# Patient Record
Sex: Male | Born: 1960 | Race: Black or African American | Hispanic: No | Marital: Single | Smoking: Former smoker
Health system: Southern US, Community
[De-identification: ages and names within clinical notes are randomized; demographics above are authoritative.]

## PROBLEM LIST (undated history)

## (undated) DIAGNOSIS — M109 Gout, unspecified: Secondary | ICD-10-CM

## (undated) DIAGNOSIS — E119 Type 2 diabetes mellitus without complications: Secondary | ICD-10-CM

## (undated) DIAGNOSIS — J45909 Unspecified asthma, uncomplicated: Secondary | ICD-10-CM

## (undated) DIAGNOSIS — K219 Gastro-esophageal reflux disease without esophagitis: Secondary | ICD-10-CM

## (undated) DIAGNOSIS — I10 Essential (primary) hypertension: Secondary | ICD-10-CM

## (undated) DIAGNOSIS — E7800 Pure hypercholesterolemia, unspecified: Secondary | ICD-10-CM

---

## 2013-09-26 DIAGNOSIS — M109 Gout, unspecified: Secondary | ICD-10-CM | POA: Insufficient documentation

## 2018-12-31 ENCOUNTER — Emergency Department (HOSPITAL_COMMUNITY): Payer: Self-pay

## 2018-12-31 ENCOUNTER — Encounter (HOSPITAL_COMMUNITY): Payer: Self-pay

## 2018-12-31 ENCOUNTER — Other Ambulatory Visit: Payer: Self-pay

## 2018-12-31 ENCOUNTER — Emergency Department (HOSPITAL_COMMUNITY)
Admission: EM | Admit: 2018-12-31 | Discharge: 2018-12-31 | Disposition: A | Discharge status: Home / Self Care | Payer: Self-pay | Attending: Emergency Medicine | Admitting: Emergency Medicine

## 2018-12-31 DIAGNOSIS — E119 Type 2 diabetes mellitus without complications: Secondary | ICD-10-CM | POA: Insufficient documentation

## 2018-12-31 DIAGNOSIS — U071 COVID-19: Secondary | ICD-10-CM | POA: Insufficient documentation

## 2018-12-31 DIAGNOSIS — I10 Essential (primary) hypertension: Secondary | ICD-10-CM | POA: Insufficient documentation

## 2018-12-31 HISTORY — DX: Type 2 diabetes mellitus without complications: E11.9

## 2018-12-31 HISTORY — DX: Gastro-esophageal reflux disease without esophagitis: K21.9

## 2018-12-31 HISTORY — DX: Pure hypercholesterolemia, unspecified: E78.00

## 2018-12-31 HISTORY — DX: Essential (primary) hypertension: I10

## 2018-12-31 LAB — POC SARS CORONAVIRUS 2 AG -  ED: SARS Coronavirus 2 Ag: POSITIVE — AB

## 2018-12-31 LAB — COMPREHENSIVE METABOLIC PANEL
ALT: 28 U/L (ref 0–44)
AST: 28 U/L (ref 15–41)
Albumin: 3.7 g/dL (ref 3.5–5.0)
Alkaline Phosphatase: 78 U/L (ref 38–126)
Anion gap: 11 (ref 5–15)
BUN: 14 mg/dL (ref 6–20)
CO2: 27 mmol/L (ref 22–32)
Calcium: 8.6 mg/dL — ABNORMAL LOW (ref 8.9–10.3)
Chloride: 94 mmol/L — ABNORMAL LOW (ref 98–111)
Creatinine, Ser: 1.39 mg/dL — ABNORMAL HIGH (ref 0.61–1.24)
GFR calc Af Amer: >60 mL/min (ref >60)
GFR calc non Af Amer: 55 mL/min — ABNORMAL LOW (ref >60)
Glucose, Bld: 215 mg/dL — ABNORMAL HIGH (ref 70–99)
Potassium: 3.1 mmol/L — ABNORMAL LOW (ref 3.5–5.1)
Sodium: 132 mmol/L — ABNORMAL LOW (ref 135–145)
Total Bilirubin: 0.9 mg/dL (ref 0.3–1.2)
Total Protein: 7.4 g/dL (ref 6.5–8.1)

## 2018-12-31 LAB — CBC
HCT: 39.4 % (ref 39.0–52.0)
Hemoglobin: 13.1 g/dL (ref 13.0–17.0)
MCH: 28.4 pg (ref 26.0–34.0)
MCHC: 33.2 g/dL (ref 30.0–36.0)
MCV: 85.5 fL (ref 80.0–100.0)
Platelets: 209 10*3/uL (ref 150–400)
RBC: 4.61 MIL/uL (ref 4.22–5.81)
RDW: 12.6 % (ref 11.5–15.5)
WBC: 4.7 10*3/uL (ref 4.0–10.5)
nRBC: 0 % (ref 0.0–0.2)

## 2018-12-31 LAB — LIPASE, BLOOD: Lipase: 44 U/L (ref 11–51)

## 2018-12-31 LAB — TROPONIN I (HIGH SENSITIVITY): Troponin I (High Sensitivity): 4 ng/L (ref <18)

## 2018-12-31 LAB — CK: Total CK: 52 U/L (ref 49–397)

## 2018-12-31 MED ORDER — SUCRALFATE 1 G PO TABS: 1.0000 g | ORAL_TABLET | Freq: Three times a day (TID) | ORAL | 0 refills | Status: DC

## 2018-12-31 MED ORDER — SODIUM CHLORIDE 0.9 % IV BOLUS
1000.0000 mL | Freq: Once | INTRAVENOUS | Status: AC
  Administered 2018-12-31: 1000 mL via INTRAVENOUS

## 2018-12-31 MED ORDER — ACETAMINOPHEN 500 MG PO TABS
1000.0000 mg | ORAL_TABLET | Freq: Once | ORAL | Status: AC
  Administered 2018-12-31: 1000 mg via ORAL
  Filled 2018-12-31: qty 2

## 2018-12-31 MED ORDER — SUCRALFATE 1 GM/10ML PO SUSP
1.0000 g | Freq: Once | ORAL | Status: AC
  Administered 2018-12-31: 1 g via ORAL
  Filled 2018-12-31: qty 10

## 2018-12-31 MED ORDER — SODIUM CHLORIDE 0.9% FLUSH
3.0000 mL | Freq: Once | INTRAVENOUS | Status: AC
  Administered 2018-12-31: 3 mL via INTRAVENOUS

## 2018-12-31 NOTE — ED Triage Notes (Signed)
Pt c/o generalized body aches for two weeks. Pt also describes having severe acid reflux that isn't going away. Pt states normally Prilosec helps, but he has been taking it every 2 hours without relief. Pt states that he has been having chills and night sweats, but this is commons with his diabetes. No change in diet.

## 2018-12-31 NOTE — ED Provider Notes (Signed)
[facility]-EMERGENCY DEPT Provider Note   CSN: [id number] Arrival date & time: 12/31/18  [id number]     History   Chief Complaint Chief Complaint  Patient presents with  . Generalized Body Aches    HPI Patrick Hawkins is a 58 y.o. male.     HPI Patient presents concern of fatigue, diffuse myalgia. Patient states that he is generally well, though he does have a history of diabetes, hypertension, and reflux. He has been taking his medication regularly. Without clear precipitant, 2 weeks ago he began having diffuse body aches, fatigue. Severity has been waxing, waning, but when symptoms are at their most pronounced, he is incapable of performing ADL, without difficulty. No focal pain.  He does have some discomfort in his sternum, associated with reflux, which he is familiar with. No dyspnea, no fever he is aware of, no vomiting, no diarrhea.  Patient notes that history of night sweats, going back 10 years is also unchanged. 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 He does not smoke, drinks alcohol.  Past Medical History:  Diagnosis Date  . Acid reflux   . Diabetes mellitus without complication (HCC)   . High cholesterol   . Hypertension       Home Medications    Prior to Admission medications   Not on File    Family History No family history on file.  Social History Social History   Tobacco Use  . Smoking status: Never Smoker  . Smokeless tobacco: Never Used  Substance Use Topics  . Alcohol use: Yes    Alcohol/week: 36.0 standard drinks    Types: 36 Cans of beer per week    Comment: 6 bottles/night  . Drug use: Never     Allergies   Cat hair extract and Shellfish allergy   Review of Systems Review of Systems  Constitutional:       Per HPI, otherwise negative  HENT:       Per HPI, otherwise negative  Respiratory:       Per HPI, otherwise negative   Cardiovascular:       Per HPI, otherwise negative  Gastrointestinal: Negative for vomiting.  Endocrine:       Negative aside from HPI  Genitourinary:       Neg aside from HPI   Musculoskeletal:       Per HPI, otherwise negative  Skin: Negative for rash.  Neurological: Positive for weakness. Negative for syncope.     Physical Exam Updated Vital Signs BP 137/83 (BP Location: Left Arm)   Pulse 89   Temp (!) 100.4 F (38 C) (Oral)   Resp 18   SpO2 99%   Physical Exam Vitals signs and nursing note reviewed.  Constitutional:      General: He is not in acute distress.    Appearance: He is well-developed.  HENT:     Head: Normocephalic and atraumatic.  Eyes:     Conjunctiva/sclera: Conjunctivae normal.  Cardiovascular:     Rate and Rhythm: Normal rate and regular rhythm.  Pulmonary:     Effort: Pulmonary effort is normal. No respiratory distress.     Breath sounds: No stridor.  Abdominal:     General: There is no distension.  Skin:    General: Skin is warm and dry.  Neurological:     Mental Status: He is alert and oriented to person, place, and time.  ED Treatments / Results  Labs (all labs ordered are listed, but only abnormal results are displayed) Labs Reviewed  COMPREHENSIVE METABOLIC PANEL - Abnormal; Notable for the following components:      Result Value   Sodium 132 (*)    Potassium 3.1 (*)    Chloride 94 (*)    Glucose, Bld 215 (*)    Creatinine, Ser 1.39 (*)    Calcium 8.6 (*)    GFR calc non Af Amer 55 (*)    All other components within normal limits  POC SARS CORONAVIRUS 2 AG -  ED - Abnormal; Notable for the following components:   SARS Coronavirus 2 Ag POSITIVE (*)    All other components within normal limits  CBC  LIPASE, BLOOD  CK  TROPONIN I (HIGH SENSITIVITY)  TROPONIN I (HIGH SENSITIVITY)    EKG EKG Interpretation  Date/Time:  Friday December 31 2018 09:34:49 EST Ventricular Rate:  105 PR Interval:    QRS Duration: 120 QT  Interval:  335 QTC Calculation: 443 R Axis:   -37 Text Interpretation: Sinus tachycardia LVH with IVCD, LAD and secondary repol abnrm Baseline wander in lead(s) V1 Non-specific intra-ventricular conduction delay Artifact Abnormal ECG Confirmed by Carmin Muskrat [phone]) on 12/31/2018 9:39:47 AM   Radiology Dg Chest 2 View  Result Date: 12/31/2018 CLINICAL DATA:  Body aches, chest pain. EXAM: CHEST - 2 VIEW COMPARISON:  No pertinent prior studies available for comparison. FINDINGS: Heart size within normal limits. No airspace consolidation. No evidence of pneumothorax or sizable pleural effusion. No acute bony abnormality. Overlying monitoring leads. IMPRESSION: No airspace consolidation. Electronically Signed   By: Kellie Simmering DO   On: 12/31/2018 10:05    Procedures Procedures (including critical care time)  Medications Ordered in ED Medications  sodium chloride flush (NS) 0.9 % injection 3 mL (3 mLs Intravenous Given 12/31/18 1003)  sodium chloride 0.9 % bolus 1,000 mL (1,000 mLs Intravenous New Bag/Given 12/31/18 1003)  sucralfate (CARAFATE) 1 GM/10ML suspension 1 g (1 g Oral Given 12/31/18 0953)  acetaminophen (TYLENOL) tablet 1,000 mg (1,000 mg Oral Given 12/31/18 0953)     Initial Impression / Assessment and Plan / ED Course  I have reviewed the triage vital signs and the nursing notes.  Pertinent labs & imaging results that were available during my care of the patient were reviewed by me and considered in my medical decision making (see chart for details).        11:07 AM Patient awake, alert, hemodynamically unremarkable still. I reviewed labs, x-ray with him at length, including no evidence for pneumonia, no substantial abnormalities. Patient does have Covid positive test.  We spent some time discussing this.  Absent hemodynamic instability, and given the duration of illness about 2 weeks, without decompensation, the patient is appropriate for discharge with outpatient  management.  We discussed importance of quarantine, self-monitoring, return precautions.  Patrick Hawkins was evaluated in Emergency Department on 12/31/2018 for the symptoms described in the history of present illness. He was evaluated in the context of the global COVID-19 pandemic, which necessitated consideration that the patient might be at risk for infection with the SARS-CoV-2 virus that causes COVID-19. Institutional protocols and algorithms that pertain to the evaluation of patients at risk for COVID-19 are in a state of rapid change based on information released by regulatory bodies including the CDC and federal and state organizations. These policies and algorithms were followed during the patient's care in the ED.    Final Clinical  Impressions(s) / ED Diagnoses   Final diagnoses:  COVID-19 virus infection     Gerhard Munch, MD 12/31/18 1108

## 2018-12-31 NOTE — ED Notes (Signed)
Patient transported to CT 

## 2019-03-30 ENCOUNTER — Encounter (HOSPITAL_COMMUNITY): Payer: Self-pay | Admitting: Emergency Medicine

## 2019-03-30 ENCOUNTER — Other Ambulatory Visit: Payer: Self-pay

## 2019-03-30 ENCOUNTER — Emergency Department (HOSPITAL_COMMUNITY)
Admission: EM | Admit: 2019-03-30 | Discharge: 2019-03-31 | Discharge status: Against Medical Advice | Payer: Self-pay | Attending: Emergency Medicine | Admitting: Emergency Medicine

## 2019-03-30 DIAGNOSIS — Z653 Problems related to other legal circumstances: Secondary | ICD-10-CM

## 2019-03-30 NOTE — ED Notes (Signed)
Pt currently refusing everything and being combative with PD
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
## 2019-03-30 NOTE — ED Provider Notes (Addendum)
[place] DEPT Provider Note   CSN: [id number] Arrival date & time: 03/30/19  2315     History No chief complaint on file.   Patrick Hawkins is a 59 y.o. male.  The history is provided by the patient.  Illness Location:  In the street  Quality:  Brought in for legal blood draw. Patient is denying complaints and refusing all care.   Severity:  Mild Onset quality:  Sudden Timing:  Constant Progression:  Unchanged Chronicity:  New Context:  In police custody Relieved by:  Nothing Worsened by:  Nothing Ineffective treatments:  None tried Associated symptoms: no abdominal pain, no chest pain, no congestion and no wheezing        Past Medical History:  Diagnosis Date  . Acid reflux   . Diabetes mellitus without complication ([place])   . High cholesterol   . Hypertension     There are no problems to display for this patient.   History reviewed. No pertinent surgical history.     History reviewed. No pertinent family history.  Social History   Tobacco Use  . Smoking status: Never Smoker  . Smokeless tobacco: Never Used  Substance Use Topics  . Alcohol use: Yes    Alcohol/week: 36.0 standard drinks    Types: 36 Cans of beer per week    Comment: 6 bottles/night  . Drug use: Never    Home Medications Prior to Admission medications   Medication Sig Start Date End Date Taking? Authorizing Provider  sucralfate (CARAFATE) 1 g tablet Take 1 tablet (1 g total) by mouth 4 (four) times daily -  with meals and at bedtime. 12/31/18   Carmin Muskrat, MD    Allergies    Cat hair extract and Shellfish allergy  Review of Systems   Review of Systems  Constitutional: Negative for unexpected weight change.  HENT: Negative for congestion.   Eyes: Negative for visual disturbance.  Respiratory: Negative for wheezing.   Cardiovascular: Negative for chest pain.  Gastrointestinal: Negative for abdominal pain.  Endocrine: Negative for polyuria.   Genitourinary: Negative for difficulty urinating.  Musculoskeletal: Negative for arthralgias.  Neurological: Negative for dizziness.  All other systems reviewed and are negative.   Physical Exam Updated Vital Signs There were no vitals taken for this visit.  Physical Exam Vitals and nursing note reviewed.  Constitutional:      General: He is not in acute distress.    Appearance: Normal appearance.  HENT:     Head: Normocephalic and atraumatic.     Nose: Nose normal.  Eyes:     Conjunctiva/sclera: Conjunctivae normal.     Pupils: Pupils are equal, round, and reactive to light.  Cardiovascular:     Rate and Rhythm: Normal rate and regular rhythm.     Pulses: Normal pulses.     Heart sounds: Normal heart sounds.  Pulmonary:     Effort: Pulmonary effort is normal.     Breath sounds: Normal breath sounds.  Abdominal:     General: Abdomen is flat. Bowel sounds are normal.     Tenderness: There is no abdominal tenderness. There is no guarding.  Musculoskeletal:        General: Normal range of motion.     Cervical back: Normal range of motion and neck supple.     Right lower leg: No edema.     Left lower leg: No edema.  Skin:    General: Skin is warm and dry.     Capillary  Refill: Capillary refill takes less than 2 seconds.  Neurological:     General: No focal deficit present.     Mental Status: He is alert and oriented to person, place, and time.     Deep Tendon Reflexes: Reflexes normal.  Psychiatric:        Mood and Affect: Affect is angry.        Behavior: Behavior is aggressive.     ED Results / Procedures / Treatments   Labs (all labs ordered are listed, but only abnormal results are displayed) Labs Reviewed - No data to display  EKG None  Radiology No results found.  Procedures Procedures (including critical care time)  Medications Ordered in ED Medications - No data to display  ED Course  I have reviewed the triage vital signs and the nursing  notes.  Pertinent labs & imaging results that were available during my care of the patient were reviewed by me and considered in my medical decision making (see chart for details).   Patient is clinically sober and is AO4.  He was not supposed to have been checked in as a patient and is denying all complaints and refusing all medical care as is his right.  He understands the ramifications of his actions.  He is leaving against medical advice.      Patient refused care multiple times and all intervention.  He is leaving AMA in police custody.    Final Clinical Impression(s) / ED Diagnoses AMA from the hospital in police custody.     Leon Goodnow, MD 03/30/19 Soledad Gerlach, Adelaida Reindel, MD 03/31/19 0272

## 2019-03-30 NOTE — ED Notes (Signed)
Charge nurse attempting to drawl legal blood drawl.

## 2019-03-30 NOTE — ED Notes (Signed)
MD at bedside. 

## 2019-03-30 NOTE — ED Notes (Signed)
PT refused all treatment from ED staff.

## 2019-03-30 NOTE — ED Notes (Signed)
Pt placed in hard restraints with verbal order form Palumbo MD. Pt verbal abusive and attempting to leave. Pt restrained by PD and is in PD custody.

## 2019-05-28 ENCOUNTER — Ambulatory Visit (HOSPITAL_COMMUNITY): Admission: EM | Admit: 2019-05-28 | Discharge: 2019-05-28 | Discharge status: Against Medical Advice | Payer: Self-pay

## 2019-05-28 ENCOUNTER — Other Ambulatory Visit: Payer: Self-pay

## 2019-05-28 NOTE — ED Notes (Signed)
Pt states he will return at a later time

## 2019-05-30 ENCOUNTER — Ambulatory Visit (HOSPITAL_COMMUNITY)
Admission: EM | Admit: 2019-05-30 | Discharge: 2019-05-30 | Disposition: A | Discharge status: Home / Self Care | Payer: Self-pay | Attending: Internal Medicine | Admitting: Internal Medicine

## 2019-05-30 ENCOUNTER — Other Ambulatory Visit: Payer: Self-pay

## 2019-05-30 DIAGNOSIS — R61 Generalized hyperhidrosis: Secondary | ICD-10-CM | POA: Insufficient documentation

## 2019-05-30 DIAGNOSIS — F5101 Primary insomnia: Secondary | ICD-10-CM | POA: Insufficient documentation

## 2019-05-30 LAB — CBC WITH DIFFERENTIAL/PLATELET
Abs Immature Granulocytes: 0.02 10*3/uL (ref 0.00–0.07)
Basophils Absolute: 0.1 10*3/uL (ref 0.0–0.1)
Basophils Relative: 2 %
Eosinophils Absolute: 0.1 10*3/uL (ref 0.0–0.5)
Eosinophils Relative: 2 %
HCT: 39.5 % (ref 39.0–52.0)
Hemoglobin: 13 g/dL (ref 13.0–17.0)
Immature Granulocytes: 0 %
Lymphocytes Relative: 32 %
Lymphs Abs: 1.9 10*3/uL (ref 0.7–4.0)
MCH: 28.5 pg (ref 26.0–34.0)
MCHC: 32.9 g/dL (ref 30.0–36.0)
MCV: 86.6 fL (ref 80.0–100.0)
Monocytes Absolute: 0.6 10*3/uL (ref 0.1–1.0)
Monocytes Relative: 10 %
Neutro Abs: 3.3 10*3/uL (ref 1.7–7.7)
Neutrophils Relative %: 54 %
Platelets: 260 10*3/uL (ref 150–400)
RBC: 4.56 MIL/uL (ref 4.22–5.81)
RDW: 12.5 % (ref 11.5–15.5)
WBC: 6 10*3/uL (ref 4.0–10.5)
nRBC: 0 % (ref 0.0–0.2)

## 2019-05-30 LAB — COMPREHENSIVE METABOLIC PANEL
ALT: 22 U/L (ref 0–44)
AST: 19 U/L (ref 15–41)
Albumin: 3.9 g/dL (ref 3.5–5.0)
Alkaline Phosphatase: 62 U/L (ref 38–126)
Anion gap: 12 (ref 5–15)
BUN: 14 mg/dL (ref 6–20)
CO2: 26 mmol/L (ref 22–32)
Calcium: 9.6 mg/dL (ref 8.9–10.3)
Chloride: 98 mmol/L (ref 98–111)
Creatinine, Ser: 1.06 mg/dL (ref 0.61–1.24)
GFR calc Af Amer: >60 mL/min (ref >60)
GFR calc non Af Amer: >60 mL/min (ref >60)
Glucose, Bld: 170 mg/dL — ABNORMAL HIGH (ref 70–99)
Potassium: 3.8 mmol/L (ref 3.5–5.1)
Sodium: 136 mmol/L (ref 135–145)
Total Bilirubin: 0.5 mg/dL (ref 0.3–1.2)
Total Protein: 7.2 g/dL (ref 6.5–8.1)

## 2019-05-30 LAB — LACTATE DEHYDROGENASE: LDH: 139 U/L (ref 98–192)

## 2019-05-30 LAB — URIC ACID: Uric Acid, Serum: 7.5 mg/dL (ref 3.7–8.6)

## 2019-05-30 LAB — TSH: TSH: 2.371 u[IU]/mL (ref 0.350–4.500)

## 2019-05-30 LAB — HIV ANTIBODY (ROUTINE TESTING W REFLEX): HIV Screen 4th Generation wRfx: NONREACTIVE

## 2019-05-30 MED ORDER — TRAZODONE HCL 50 MG PO TABS: 50.0000 mg | ORAL_TABLET | Freq: Every day | ORAL | 1 refills | Status: DC

## 2019-05-30 MED ORDER — OLOPATADINE HCL 0.1 % OP SOLN: 1.0000 [drp] | Freq: Two times a day (BID) | OPHTHALMIC | 12 refills | Status: DC

## 2019-05-30 MED ORDER — MONTELUKAST SODIUM 10 MG PO TABS: 10.0000 mg | ORAL_TABLET | Freq: Every day | ORAL | 0 refills | Status: DC

## 2019-06-01 NOTE — ED Provider Notes (Signed)
[facility]    CSN: [id number] Arrival date & time: 05/30/19  1619      History   Chief Complaint Chief Complaint  Patient presents with  . Night Sweats    HPI Patrick Hawkins is a 59 y.o. male comes to the urgent care with complaints of night sweats of a few months duration.  Patient is a diabetic on oral hypoglycemic agents comes to urgent care with complaints of night sweats as well as inability to sleep well at night.  He says that the night sweats bad enough that he changes his clothes in the middle of the night.  He has some weight loss over the past few months.  The only time that he sleeps well without night sweats is when he drinks alcohol.  On further inquiry he drinks socially.  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  No fever or chills.  No masses or swelling.  No dizziness, near syncope or syncopal episodes.   HPI  Past Medical History:  Diagnosis Date  . Acid reflux   . Diabetes mellitus without complication (HCC)   . High cholesterol   . Hypertension     There are no problems to display for this patient.   No past surgical history on file.     Home Medications    Prior to Admission medications   Medication Sig Start Date End Date Taking? Authorizing Provider  lisinopril (ZESTRIL) 20 MG tablet Take 20 mg by mouth daily.   Yes [provider]  metFORMIN (GLUCOPHAGE) 1000 MG tablet Take 1,000 mg by mouth 2 (two) times daily with a meal.   Yes [provider]  simvastatin (ZOCOR) 10 MG tablet Take 10 mg by mouth daily.   Yes [provider]  UNKNOWN TO PATIENT "something for my thyroid"   Yes [provider]  montelukast (SINGULAIR) 10 MG tablet Take 1 tablet (10 mg total) by mouth at bedtime. 05/30/19   Bayyinah Dukeman, Britta Mccreedy, MD  olopatadine (PATANOL) 0.1 % ophthalmic solution Place 1 drop into both eyes 2 (two) times daily. 05/30/19   Merrilee Jansky, MD  traZODone (DESYREL) 50 MG  tablet Take 1 tablet (50 mg total) by mouth at bedtime. 05/30/19   Mohanad Carsten, Britta Mccreedy, MD    Family History No family history on file.  Social History Social History   Tobacco Use  . Smoking status: Never Smoker  . Smokeless tobacco: Never Used  Substance Use Topics  . Alcohol use: Yes    Alcohol/week: 36.0 standard drinks    Types: 36 Cans of beer per week    Comment: 6 bottles/night  . Drug use: Never     Allergies   Cat hair extract and Shellfish allergy   Review of Systems Review of Systems  Constitutional: Positive for fatigue.  HENT: Negative.   Respiratory: Negative.   Gastrointestinal: Negative.  Negative for abdominal pain, diarrhea and rectal pain.  Musculoskeletal: Negative for arthralgias, gait problem, joint swelling and myalgias.  Skin: Negative.  Negative for pallor, rash and wound.  Neurological: Negative.  Negative for seizures, facial asymmetry, weakness, light-headedness, numbness and headaches.  Psychiatric/Behavioral: Positive for sleep disturbance. Negative for confusion.     Physical Exam Triage Vital Signs ED Triage Vitals [05/30/19 1645]  Enc Vitals Group     BP (!) 156/92     Pulse Rate 81     Resp 16     Temp 99.4 F (37.4 C)  Temp src      SpO2 98 %     Weight      Height      Head Circumference      Peak Flow      Pain Score 0     Pain Loc      Pain Edu?      Excl. in [place]?    No data found.  Updated Vital Signs BP (!) 156/92   Pulse 81   Temp 99.4 F (37.4 C)   Resp 16   SpO2 98%   Visual Acuity Right Eye Distance:   Left Eye Distance:   Bilateral Distance:    Right Eye Near:   Left Eye Near:    Bilateral Near:     Physical Exam Constitutional:      General: He is not in acute distress.    Appearance: Normal appearance. He is not ill-appearing.  Cardiovascular:     Rate and Rhythm: Normal rate and regular rhythm.     Pulses: Normal pulses.     Heart sounds: Normal heart sounds.  Pulmonary:     Effort:  Pulmonary effort is normal.     Breath sounds: Normal breath sounds. No wheezing or rhonchi.  Abdominal:     General: Abdomen is flat. Bowel sounds are normal.     Palpations: Abdomen is soft.  Musculoskeletal:        General: No swelling or signs of injury. Normal range of motion.     Cervical back: Normal range of motion and neck supple.     Right lower leg: No edema.  Lymphadenopathy:     Cervical: No cervical adenopathy.  Skin:    General: Skin is warm.  Neurological:     General: No focal deficit present.     Mental Status: He is alert and oriented to person, place, and time.      UC Treatments / Results  Labs (all labs ordered are listed, but only abnormal results are displayed) Labs Reviewed  COMPREHENSIVE METABOLIC PANEL - Abnormal; Notable for the following components:      Result Value   Glucose, Bld 170 (*)    All other components within normal limits  CBC WITH DIFFERENTIAL/PLATELET  TSH  LACTATE DEHYDROGENASE  URIC ACID  HIV ANTIBODY (ROUTINE TESTING W REFLEX)    EKG   Radiology No results found.  Procedures Procedures (including critical care time)  Medications Ordered in UC Medications - No data to display  Initial Impression / Assessment and Plan / UC Course  I have reviewed the triage vital signs and the nursing notes.  Pertinent labs & imaging results that were available during my care of the patient were reviewed by me and considered in my medical decision making (see chart for details).    1.  Weight loss and night sweats: CBC, BMP, LDH, uric acid We will reach out to patient if any of the labs are abnormal If symptoms persist patient will need further work-up for the night sweats  2.  Insomnia: Trazodone as needed for sleep Return precautions given.  Final Clinical Impressions(s) / UC Diagnoses   Final diagnoses:  Unexplained night sweats  Primary insomnia   Discharge Instructions   None    ED Prescriptions    Medication Sig  Dispense Auth. Provider   montelukast (SINGULAIR) 10 MG tablet  (Status: Discontinued) Take 1 tablet (10 mg total) by mouth at bedtime. 30 tablet Khrista Braun, Myrene Galas, MD   olopatadine (PATANOL) 0.1 %  ophthalmic solution  (Status: Discontinued) Place 1 drop into both eyes 2 (two) times daily. 5 mL Neelam Tiggs, Britta Mccreedy, MD   traZODone (DESYREL) 50 MG tablet  (Status: Discontinued) Take 1 tablet (50 mg total) by mouth at bedtime. 30 tablet Shaheim Mahar, Britta Mccreedy, MD   montelukast (SINGULAIR) 10 MG tablet Take 1 tablet (10 mg total) by mouth at bedtime. 30 tablet Malijah Lietz, Britta Mccreedy, MD   olopatadine (PATANOL) 0.1 % ophthalmic solution Place 1 drop into both eyes 2 (two) times daily. 5 mL Angie Piercey, Britta Mccreedy, MD   traZODone (DESYREL) 50 MG tablet Take 1 tablet (50 mg total) by mouth at bedtime. 30 tablet Lavren Lewan, Britta Mccreedy, MD     PDMP not reviewed this encounter.   Merrilee Jansky, MD 06/01/19 1425

## 2020-03-08 ENCOUNTER — Ambulatory Visit (HOSPITAL_COMMUNITY)
Admission: EM | Admit: 2020-03-08 | Discharge: 2020-03-08 | Disposition: A | Discharge status: Home / Self Care | Payer: Self-pay | Attending: Internal Medicine | Admitting: Internal Medicine

## 2020-03-08 ENCOUNTER — Encounter (HOSPITAL_COMMUNITY): Payer: Self-pay

## 2020-03-08 DIAGNOSIS — M10061 Idiopathic gout, right knee: Secondary | ICD-10-CM

## 2020-03-08 HISTORY — DX: Unspecified asthma, uncomplicated: J45.909

## 2020-03-08 MED ORDER — METHYLPREDNISOLONE SODIUM SUCC 125 MG IJ SOLR
60.0000 mg | Freq: Once | INTRAMUSCULAR | Status: AC
  Administered 2020-03-08: 60 mg via INTRAMUSCULAR

## 2020-03-08 MED ORDER — PREDNISONE 20 MG PO TABS: 20.0000 mg | ORAL_TABLET | Freq: Every day | ORAL | 0 refills | Status: AC

## 2020-03-08 MED ORDER — LISINOPRIL 20 MG PO TABS: 20.0000 mg | ORAL_TABLET | Freq: Every day | ORAL | 1 refills | Status: DC

## 2020-03-08 MED ORDER — METHYLPREDNISOLONE SODIUM SUCC 125 MG IJ SOLR
INTRAMUSCULAR | Status: AC
  Filled 2020-03-08: qty 2

## 2020-03-08 MED ORDER — METFORMIN HCL 1000 MG PO TABS: 1000.0000 mg | ORAL_TABLET | Freq: Two times a day (BID) | ORAL | 1 refills | Status: DC

## 2020-03-08 MED ORDER — SILDENAFIL CITRATE 100 MG PO TABS: 100.0000 mg | ORAL_TABLET | Freq: Every day | ORAL | 2 refills | Status: DC | PRN

## 2020-03-08 NOTE — ED Provider Notes (Signed)
[facility]    CSN: [id number] Arrival date & time: 03/08/20  1106      History   Chief Complaint Chief Complaint  Patient presents with  . Knee Injury    Right     HPI Patrick Hawkins is a 60 y.o. male with a history of gout comes to urgent care with 1 week history of painful right knee swelling.  Onset was spontaneous.  No trauma or falls.  Pain is severe, constant, throbbing and relieved partially by NSAIDs.  Denies any fever or chills.  No fever or chills.  No rash on skin.  In the past, patient has had gout in the great toes.  He denies any significant dietary indiscretion.   In the past, prednisone has been the most effective medication in managing his acute gout flares.  Patient is diabetic, controlled on Metformin.  Patient has had very good blood sugar control.  HPI  Past Medical History:  Diagnosis Date  . Acid reflux   . Asthma   . Diabetes mellitus without complication (HCC)   . High cholesterol   . Hypertension     There are no problems to display for this patient.   History reviewed. No pertinent surgical history.     Home Medications    Prior to Admission medications   Medication Sig Start Date End Date Taking? Authorizing Provider  predniSONE (DELTASONE) 20 MG tablet Take 1 tablet (20 mg total) by mouth daily for 3 days. 03/08/20 03/11/20 Yes Ansley Stanwood, Britta Mccreedy, MD  sildenafil (VIAGRA) 100 MG tablet Take 1 tablet (100 mg total) by mouth daily as needed for erectile dysfunction. 03/08/20  Yes Scotland Dost, Britta Mccreedy, MD  tadalafil (CIALIS) 10 MG tablet Take 10 mg by mouth daily as needed for erectile dysfunction.  03/08/20 Yes [provider]  lisinopril (ZESTRIL) 20 MG tablet Take 1 tablet (20 mg total) by mouth daily. 03/08/20 06/06/20  Merrilee Jansky, MD  metFORMIN (GLUCOPHAGE) 1000 MG tablet Take 1 tablet (1,000 mg total) by mouth 2 (two) times daily with a meal. 03/08/20 06/06/20  Soriyah Osberg, Britta Mccreedy, MD  montelukast (SINGULAIR) 10 MG tablet  Take 1 tablet (10 mg total) by mouth at bedtime. 05/30/19   Anesha Hackert, Britta Mccreedy, MD  olopatadine (PATANOL) 0.1 % ophthalmic solution Place 1 drop into both eyes 2 (two) times daily. 05/30/19   Merrilee Jansky, MD  simvastatin (ZOCOR) 10 MG tablet Take 10 mg by mouth daily.    [provider]  traZODone (DESYREL) 50 MG tablet Take 1 tablet (50 mg total) by mouth at bedtime. 05/30/19 03/08/20  Merrilee Jansky, MD    Family History Family History  Problem Relation Age of Onset  . Diabetes Mother   . Diabetes Father     Social History Social History   Tobacco Use  . Smoking status: Never Smoker  . Smokeless tobacco: Never Used  Substance Use Topics  . Alcohol use: Yes    Alcohol/week: 36.0 standard drinks    Types: 36 Cans of beer per week    Comment: 6 bottles/night  . Drug use: Never     Allergies   Cat hair extract and Shellfish allergy   Review of Systems Review of Systems  Respiratory: Negative.   Gastrointestinal: Negative.   Genitourinary: Negative.   Musculoskeletal: Positive for arthralgias and joint swelling. Negative for myalgias.  Skin: Negative.   Neurological: Negative for dizziness, numbness and headaches.     Physical Exam Triage Vital Signs ED  Triage Vitals  Enc Vitals Group     BP 03/08/20 1217 (!) 151/87     Pulse Rate 03/08/20 1217 88     Resp 03/08/20 1217 15     Temp 03/08/20 1217 98.8 F (37.1 C)     Temp Source 03/08/20 1217 Oral     SpO2 03/08/20 1217 100 %     Weight 03/08/20 1213 175 lb (79.4 kg)     Height 03/08/20 1213 5\' 10"  (1.778 m)     Head Circumference --      Peak Flow --      Pain Score 03/08/20 1213 8     Pain Loc --      Pain Edu? --      Excl. in GC? --    No data found.  Updated Vital Signs BP (!) 151/87 (BP Location: Right Arm) Comment: pt forgot bp meds today  Pulse 88   Temp 98.8 F (37.1 C) (Oral)   Resp 15   Ht 5\' 10"  (1.778 m)   Wt 79.4 kg   SpO2 100%   BMI 25.11 kg/m   Visual Acuity Right  Eye Distance:   Left Eye Distance:   Bilateral Distance:    Right Eye Near:   Left Eye Near:    Bilateral Near:     Physical Exam Vitals and nursing note reviewed.  Constitutional:      General: He is in acute distress.     Appearance: He is not ill-appearing.  Cardiovascular:     Rate and Rhythm: Normal rate and regular rhythm.     Pulses: Normal pulses.     Heart sounds: Normal heart sounds.  Pulmonary:     Effort: Pulmonary effort is normal.     Breath sounds: Normal breath sounds.  Musculoskeletal:        General: Swelling and tenderness present. No deformity.     Comments: Limited range of motion of the right knee.  Skin:    General: Skin is warm.     Coloration: Skin is not jaundiced.     Findings: No bruising or erythema.  Neurological:     Mental Status: He is alert.      UC Treatments / Results  Labs (all labs ordered are listed, but only abnormal results are displayed) Labs Reviewed - No data to display  EKG   Radiology No results found.  Procedures Procedures (including critical care time)  Medications Ordered in UC Medications  methylPREDNISolone sodium succinate (SOLU-MEDROL) 125 mg/2 mL injection 60 mg (60 mg Intramuscular Given 03/08/20 1345)    Initial Impression / Assessment and Plan / UC Course  I have reviewed the triage vital signs and the nursing notes.  Pertinent labs & imaging results that were available during my care of the patient were reviewed by me and considered in my medical decision making (see chart for details).     1.  Acute gout flare of right knee: Solu-Medrol 60 mg IM x1 dose Prednisone 20 mg orally daily for 3 days Icing of the right knee Elevation of the right leg Tylenol as needed for pain If symptoms worsen please return to urgent care to be reevaluated  2.  Medication refill: Lisinopril, Metformin, sildenafil were refilled. Final Clinical Impressions(s) / UC Diagnoses   Final diagnoses:  Acute idiopathic  gout of right knee     Discharge Instructions     Please take medications as directed Continue icing your right knee Gentle range of motion exercises  Rest and elevation of the leg Return to urgent care if symptoms worsen.   ED Prescriptions    Medication Sig Dispense Auth. Provider   sildenafil (VIAGRA) 100 MG tablet Take 1 tablet (100 mg total) by mouth daily as needed for erectile dysfunction. 30 tablet Jevaughn Degollado, Britta Mccreedy, MD   lisinopril (ZESTRIL) 20 MG tablet Take 1 tablet (20 mg total) by mouth daily. 90 tablet Kalista Laguardia, Britta Mccreedy, MD   metFORMIN (GLUCOPHAGE) 1000 MG tablet Take 1 tablet (1,000 mg total) by mouth 2 (two) times daily with a meal. 180 tablet Julianny Milstein, Britta Mccreedy, MD   predniSONE (DELTASONE) 20 MG tablet Take 1 tablet (20 mg total) by mouth daily for 3 days. 3 tablet Bettyanne Dittman, Britta Mccreedy, MD     PDMP not reviewed this encounter.   Merrilee Jansky, MD 03/08/20 [phone]

## 2020-03-08 NOTE — ED Triage Notes (Signed)
Pt presents with R knee swelling 1 week. Pain when walking, and resting.  no injury noted  pt reports pmh of gout. Pt reports gout in toe typically.

## 2020-03-08 NOTE — Discharge Instructions (Signed)
Please take medications as directed Continue icing your right knee Gentle range of motion exercises Rest and elevation of the leg Return to urgent care if symptoms worsen.

## 2020-04-16 ENCOUNTER — Telehealth: Payer: Self-pay | Admitting: Internal Medicine

## 2020-04-18 ENCOUNTER — Other Ambulatory Visit: Payer: Self-pay

## 2020-04-18 ENCOUNTER — Ambulatory Visit (HOSPITAL_COMMUNITY)
Admission: EM | Admit: 2020-04-18 | Discharge: 2020-04-18 | Disposition: A | Discharge status: Home / Self Care | Payer: Self-pay | Attending: Emergency Medicine | Admitting: Emergency Medicine

## 2020-04-18 ENCOUNTER — Ambulatory Visit (INDEPENDENT_AMBULATORY_CARE_PROVIDER_SITE_OTHER): Payer: Self-pay

## 2020-04-18 ENCOUNTER — Encounter (HOSPITAL_COMMUNITY): Payer: Self-pay

## 2020-04-18 ENCOUNTER — Telehealth (HOSPITAL_COMMUNITY): Payer: Self-pay | Admitting: Emergency Medicine

## 2020-04-18 DIAGNOSIS — M25461 Effusion, right knee: Secondary | ICD-10-CM

## 2020-04-18 DIAGNOSIS — M25561 Pain in right knee: Secondary | ICD-10-CM

## 2020-04-18 DIAGNOSIS — R21 Rash and other nonspecific skin eruption: Secondary | ICD-10-CM

## 2020-04-18 MED ORDER — MELOXICAM 7.5 MG PO TABS: 7.5000 mg | ORAL_TABLET | Freq: Every day | ORAL | 0 refills | Status: AC

## 2020-04-18 MED ORDER — TRIAMCINOLONE ACETONIDE 0.1 % EX CREA: 1.0000 "application " | TOPICAL_CREAM | Freq: Two times a day (BID) | CUTANEOUS | 0 refills | Status: DC

## 2020-04-18 NOTE — Telephone Encounter (Signed)
Patient came in person to clinic requesting assistance, as the orthopedic doctor we recommended would not be able to get him in for 10 days.  Selena Batten, RN approached me with patient's information and I spoke to Dr. Leonides Grills prior to speaking to patient.  Provided on sticky note the information for [facility] walk in clinic if patient wants to be seen today, and also explained that waiting for the orthopedic appointment was also reasonable (even if not desirable).  Patient took the information from me without saying anything and walked out of the clinic.

## 2020-04-18 NOTE — ED Provider Notes (Signed)
[facility]    CSN: [id number] Arrival date & time: 04/18/20  8938      History   Chief Complaint Chief Complaint  Patient presents with  . Knee Pain    HPI Patrick Hawkins is a 60 y.o. male.   Presents with 6-week history of pain and swelling in his right knee.  He reports improvement after taking steroids in January 2022 but not complete resolution of his symptoms.  No falls or injury.  He believes his symptoms may be due to gout but he requests an x-ray of his knee today because the symptoms have been going on for so long.  He denies numbness, weakness, lesions, redness, bruising, or other symptoms.  Patient also reports a nonpainful nonpruritic rash on his trunk and extremities for more than 1 year.  He was seen for this by his previous PCP for this rash and treated with a cream; he does not remember the name or what kind of cream.  He moved here and has not been able to establish a PCP yet; He has an appointment in the next month or so.  He denies fever, chills, numbness, weakness, chest pain, shortness of breath, abdominal pain, or other symptoms.  Patient was seen here on 03/08/2020; diagnosed with gout of right knee; treated with Solu-Medrol, prednisone, Tylenol, ice, elevation; also medication refills for lisinopril, Metformin, sildenafil.  His medical history includes hypertension, high cholesterol, diabetes, asthma, GERD.  The history is provided by the patient and medical records.    Past Medical History:  Diagnosis Date  . Acid reflux   . Asthma   . Diabetes mellitus without complication (HCC)   . High cholesterol   . Hypertension     There are no problems to display for this patient.   History reviewed. No pertinent surgical history.     Home Medications    Prior to Admission medications   Medication Sig Start Date End Date Taking? Authorizing Provider  meloxicam (MOBIC) 7.5 MG tablet Take 1 tablet (7.5 mg total) by mouth daily for 10 days. 04/18/20  04/28/20 Yes Mickie Bail, NP  triamcinolone (KENALOG) 0.1 % Apply 1 application topically 2 (two) times daily. 04/18/20  Yes Mickie Bail, NP  lisinopril (ZESTRIL) 20 MG tablet Take 1 tablet (20 mg total) by mouth daily. 03/08/20 06/06/20  Merrilee Jansky, MD  metFORMIN (GLUCOPHAGE) 1000 MG tablet Take 1 tablet (1,000 mg total) by mouth 2 (two) times daily with a meal. 03/08/20 06/06/20  Lamptey, Britta Mccreedy, MD  montelukast (SINGULAIR) 10 MG tablet Take 1 tablet (10 mg total) by mouth at bedtime. 05/30/19   Lamptey, Britta Mccreedy, MD  olopatadine (PATANOL) 0.1 % ophthalmic solution Place 1 drop into both eyes 2 (two) times daily. 05/30/19   Lamptey, Britta Mccreedy, MD  sildenafil (VIAGRA) 100 MG tablet Take 1 tablet (100 mg total) by mouth daily as needed for erectile dysfunction. 03/08/20   Merrilee Jansky, MD  simvastatin (ZOCOR) 10 MG tablet Take 10 mg by mouth daily.    [provider]  tadalafil (CIALIS) 10 MG tablet Take 10 mg by mouth daily as needed for erectile dysfunction.  03/08/20  [provider]  traZODone (DESYREL) 50 MG tablet Take 1 tablet (50 mg total) by mouth at bedtime. 05/30/19 03/08/20  Merrilee Jansky, MD    Family History Family History  Problem Relation Age of Onset  . Diabetes Mother   . Diabetes Father     Social History  Social History   Tobacco Use  . Smoking status: Never Smoker  . Smokeless tobacco: Never Used  Substance Use Topics  . Alcohol use: Yes    Alcohol/week: 36.0 standard drinks    Types: 36 Cans of beer per week    Comment: 6 bottles/night  . Drug use: Never     Allergies   Cat hair extract and Shellfish allergy   Review of Systems Review of Systems  Constitutional: Negative for chills and fever.  HENT: Negative for ear pain and sore throat.   Eyes: Negative for pain and visual disturbance.  Respiratory: Negative for cough and shortness of breath.   Cardiovascular: Negative for chest pain and palpitations.  Gastrointestinal:  Negative for abdominal pain and vomiting.  Genitourinary: Negative for dysuria and hematuria.  Musculoskeletal: Positive for arthralgias and joint swelling.  Skin: Positive for rash. Negative for color change.  Neurological: Negative for syncope, weakness and numbness.  All other systems reviewed and are negative.    Physical Exam Triage Vital Signs ED Triage Vitals  Enc Vitals Group     BP      Pulse      Resp      Temp      Temp src      SpO2      Weight      Height      Head Circumference      Peak Flow      Pain Score      Pain Loc      Pain Edu?      Excl. in GC?    No data found.  Updated Vital Signs BP (!) 149/85 (BP Location: Right Arm)   Pulse 89   Temp 98.2 F (36.8 C) (Oral)   Resp 18   SpO2 99%   Visual Acuity Right Eye Distance:   Left Eye Distance:   Bilateral Distance:    Right Eye Near:   Left Eye Near:    Bilateral Near:     Physical Exam Vitals and nursing note reviewed.  Constitutional:      General: He is not in acute distress.    Appearance: He is well-developed and well-nourished. He is not ill-appearing.  HENT:     Head: Normocephalic and atraumatic.     Mouth/Throat:     Mouth: Mucous membranes are moist.  Eyes:     Conjunctiva/sclera: Conjunctivae normal.  Cardiovascular:     Rate and Rhythm: Normal rate and regular rhythm.     Heart sounds: Normal heart sounds.  Pulmonary:     Effort: Pulmonary effort is normal. No respiratory distress.     Breath sounds: Normal breath sounds.  Abdominal:     Palpations: Abdomen is soft.     Tenderness: There is no abdominal tenderness.  Musculoskeletal:        General: Swelling and tenderness present. No deformity or edema. Normal range of motion.     Cervical back: Neck supple.     Comments: Moderate edema and mild tenderness to palpation of right knee.  No erythema, lesions, ecchymosis.  FROM, sensation intact, strength 5/5.  Skin:    General: Skin is warm and dry.     Findings:  Rash present. No bruising or erythema.     Comments: Flesh-colored annular patches scattered on trunk and extremities.  No erythema, open wounds, drainage.  Neurological:     General: No focal deficit present.     Mental Status: He is alert and oriented  to person, place, and time.     Sensory: No sensory deficit.     Motor: No weakness.     Gait: Gait normal.  Psychiatric:        Mood and Affect: Mood and affect and mood normal.        Behavior: Behavior normal.      UC Treatments / Results  Labs (all labs ordered are listed, but only abnormal results are displayed) Labs Reviewed - No data to display  EKG   Radiology DG Knee Complete 4 Views Right  Result Date: 04/18/2020 CLINICAL DATA:  Right knee pain and swelling for 6 weeks. EXAM: RIGHT KNEE - COMPLETE 4+ VIEW COMPARISON:  None. FINDINGS: Large joint effusion is present. No acute or focal osseous abnormalities are present. The joint is located. IMPRESSION: Large joint effusion without underlying osseous abnormality. Ligamentous injury is not excluded. Electronically Signed   By: Marin Roberts M.D.   On: 04/18/2020 10:27    Procedures Procedures (including critical care time)  Medications Ordered in UC Medications - No data to display  Initial Impression / Assessment and Plan / UC Course  I have reviewed the triage vital signs and the nursing notes.  Pertinent labs & imaging results that were available during my care of the patient were reviewed by me and considered in my medical decision making (see chart for details).   Right knee pain and effusion.  Rash.  X-ray shows large joint effusion of right knee but no bony abnormality.  Treating with meloxicam, rest, elevation, ice packs, knee sleeve.  Instructed patient to call orthopedics to schedule an appointment for follow-up.  Treating rash with triamcinolone cream and instructed patient to schedule an appointment with dermatology as this rash has been present for  over a year.  Instructed patient to keep his appointment to establish a PCP.  He agrees to plan of care.   Final Clinical Impressions(s) / UC Diagnoses   Final diagnoses:  Acute pain of right knee  Effusion of right knee  Rash     Discharge Instructions     Take the meloxicam as prescribed.  Rest and elevate your knee.  Apply ice packs 2-3 times a day for up to 20 minutes each.  Wear the knee sleeve as needed for comfort.  Follow up with an orthopedist such as the one listed below.    Apply the triamcinolone cream to your rash as directed.  Follow-up with a dermatologist.         ED Prescriptions    Medication Sig Dispense Auth. Provider   meloxicam (MOBIC) 7.5 MG tablet Take 1 tablet (7.5 mg total) by mouth daily for 10 days. 10 tablet Mickie Bail, NP   triamcinolone (KENALOG) 0.1 % Apply 1 application topically 2 (two) times daily. 30 g Mickie Bail, NP     PDMP not reviewed this encounter.   Mickie Bail, NP 04/18/20 1043

## 2020-04-18 NOTE — ED Triage Notes (Signed)
Pt reports swelling and pain in the right knee. States he had the same problem 1 month ago; improved with prednisone. States he has gout.   Pt reports rash all over the body x 2 months.

## 2020-04-18 NOTE — Discharge Instructions (Addendum)
Take the meloxicam as prescribed.  Rest and elevate your knee.  Apply ice packs 2-3 times a day for up to 20 minutes each.  Wear the knee sleeve as needed for comfort.  Follow up with an orthopedist such as the one listed below.    Apply the triamcinolone cream to your rash as directed.  Follow-up with a dermatologist.

## 2020-05-03 ENCOUNTER — Telehealth (HOSPITAL_COMMUNITY): Payer: Self-pay | Admitting: Emergency Medicine

## 2020-05-03 MED ORDER — TRIAMCINOLONE ACETONIDE 0.1 % EX CREA: 1.0000 "application " | TOPICAL_CREAM | Freq: Two times a day (BID) | CUTANEOUS | 0 refills | Status: DC

## 2020-05-03 NOTE — Telephone Encounter (Signed)
Pharmacy states they never received prescription, then after resending, patient notified me he would like it send to a different pharmacy, resent

## 2020-06-07 ENCOUNTER — Ambulatory Visit: Payer: Self-pay | Admitting: Internal Medicine

## 2020-06-08 ENCOUNTER — Ambulatory Visit: Payer: Self-pay | Admitting: Surgical

## 2020-06-08 ENCOUNTER — Ambulatory Visit (INDEPENDENT_AMBULATORY_CARE_PROVIDER_SITE_OTHER): Payer: Self-pay

## 2020-06-08 ENCOUNTER — Encounter: Payer: Self-pay | Admitting: Surgical

## 2020-06-08 DIAGNOSIS — M25461 Effusion, right knee: Secondary | ICD-10-CM

## 2020-06-08 DIAGNOSIS — M79672 Pain in left foot: Secondary | ICD-10-CM

## 2020-06-08 DIAGNOSIS — M25561 Pain in right knee: Secondary | ICD-10-CM

## 2020-06-08 DIAGNOSIS — G8929 Other chronic pain: Secondary | ICD-10-CM

## 2020-06-08 MED ORDER — LIDOCAINE HCL 1 % IJ SOLN
5.0000 mL | INTRAMUSCULAR | Status: AC | PRN
  Administered 2020-06-08: 5 mL

## 2020-06-08 NOTE — Progress Notes (Signed)
Office Visit Note   Patient: Patrick Hawkins           Date of Birth: [birth year]           MRN: [id number] Visit Date: 06/08/2020 Requested by: No referring provider defined for this encounter. PCP: Pcp, No  Subjective: Chief Complaint  Patient presents with  . Right Knee - Pain    HPI: Patrick Hawkins is a 60 y.o. male who presents to the office complaining of right knee pain.  Patient reports pain for several months without any new injury.  He reports that he first noticed his symptoms when both knees began swelling without any injury.  The left knee has returned to normal according to him but the right knee has persisted with continued swelling and pain.  Pain bothers him with ambulation and he localizes the majority of his pain in the anterior aspect of the knee.  Denies any radiation of pain or any locking/instability.  He does note occasional clicking with range of motion of the knee.  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  He does have to do some physical work and maintaining this.  He has had no improvement in the last several months in his right knee symptoms.  He does have a history of gout and has previously had a prednisone course that helped with his knee pain but has not resolved it.  Typically with his gout flareups, he has symptoms in his first MTP joint and these episodes are short-lived and respond quickly to steroids.  Denies any groin pain or radicular pain.  Denies any fevers or chills or previous knee surgery.  He does endorse night sweats but notes this is a chronic issue for him that has been ongoing for several years without any known cause.  No recent hiking or camping trips.  No history of Lyme disease.  No history of rheumatoid arthritis.  Does have history of diabetes.  He also endorses 1 week history of left foot pain that he localizes to the lateral aspect of the foot around the midshaft of the fifth metatarsal.  Denies any known  injury.  Pain is worse with ambulation and he has not really tried anything to help with the pain aside from taking ibuprofen.  No history of prior left foot issues..                ROS: All systems reviewed are negative as they relate to the chief complaint within the history of present illness.  Patient denies fevers or chills.  Assessment & Plan: Visit Diagnoses:  1. Effusion, right knee   2. Pain in left foot   3. Chronic pain of right knee     Plan: Patient is a 60 year old male who presents for evaluation of right knee pain.  He has had multiple months of right knee swelling with pain that has not improved.  He does have history of gout but this does not feel like his typically self-limited gout episodes.  He denies any history of injury and he has had right knee radiographs on 04/18/2020 when he went to the ED that were negative for any acute or chronic osseous injury.  No ligamentous instability on exam today.  He does have some occasional clicking in the knee but no symptomatic instability or any locking of the knee.  With no significant degenerative changes on radiographs as well as long history of this persistent large effusion,  plan to obtain lab work as well as aspirate analysis for further evaluation.  Also ordered right knee MRI to evaluate for any structural injury.  About 80 cc was aspirated from the right knee with inflammatory synovial fluid but nothing that appeared grossly purulent.  No debris was noted in the aspiration.  There is no significant hemarthrosis.  Additionally, left foot radiographs were taken today and there is no radiographic evidence of any pathology where patient is complaining of pain.  Recommended he try topical anti-inflammatories with rigid orthotic to help with lateral foot pain and plan for reevaluation when he returns to discuss lab results and MRI results.  Patient agreed with this plan.  Follow-Up Instructions: No follow-ups on file.   Orders:  Orders  Placed This Encounter  Procedures  . Anaerobic and Aerobic Culture  . XR Foot Complete Left  . MR Knee Right w/o contrast  . CBC  . Comprehensive Metabolic Panel (CMET)  . Sed Rate (ESR)  . C-reactive protein  . Rheumatoid Factor  . Antinuclear Antib (ANA)  . Cell count + diff,  w/ cryst-synvl fld   No orders of the defined types were placed in this encounter.     Procedures: Large Joint Inj: R knee on 06/08/2020 8:52 PM Indications: diagnostic evaluation, joint swelling and pain Details: 18 G 1.5 in needle, superolateral approach  Arthrogram: No  Medications: 5 mL lidocaine 1 % Aspirate: 80 mL yellow Outcome: tolerated well, no immediate complications Procedure, treatment alternatives, risks and benefits explained, specific risks discussed. Consent was given by the patient. Immediately prior to procedure a time out was called to verify the correct patient, procedure, equipment, support staff and site/side marked as required. Patient was prepped and draped in the usual sterile fashion.       Clinical Data: No additional findings.  Objective: Vital Signs: There were no vitals taken for this visit.  Physical Exam:  Constitutional: Patient appears well-developed HEENT:  Head: Normocephalic Eyes:EOM are normal Neck: Normal range of motion Cardiovascular: Normal rate Pulmonary/chest: Effort normal Neurologic: Patient is alert Skin: Skin is warm Psychiatric: Patient has normal mood and affect  Ortho Exam: Ortho exam demonstrates right knee with very large effusion.  Left knee with small effusion.  Tenderness throughout the right knee primarily in the anterior lateral joint line as well as over the patellar tendon.  Mild tenderness over the medial joint line.  No tenderness elsewhere throughout the knee.  He has intact extensor mechanism with ability to perform straight leg raise multiple times.  No calf tenderness.  No significant increased warmth of the right knee.  No  significant instability or laxity to varus/valgus stress.  No laxity to anterior/posterior drawer and negative Lachman exam.  Patient with 0 degrees of knee extension and 120 degrees of knee flexion.  Left foot with intact pedal pulses.  Dorsiflexion plantarflexion and inversion/eversion are intact.  No pain with active motion of the ankle joint.  Tenderness in the midshaft of the fifth metatarsal but no fifth metatarsal base tenderness.  No pain over the Lisfranc complex.  No plantar tenderness at all with only tenderness over the dorsal aspect of the metatarsal.  No tenderness over the navicular or along the course of the peroneal tendons.  No tenderness over the Achilles tendon insertion or throughout the retrocalcaneal space.  No calf tenderness of the left leg.  Specialty Comments:  No specialty comments available.  Imaging: No results found.   PMFS History: There are no problems to  display for this patient.  Past Medical History:  Diagnosis Date  . Acid reflux   . Asthma   . Diabetes mellitus without complication ([place])   . High cholesterol   . Hypertension     Family History  Problem Relation Age of Onset  . Diabetes Mother   . Diabetes Father     History reviewed. No pertinent surgical history. Social History   Occupational History  . Not on file  Tobacco Use  . Smoking status: Never Smoker  . Smokeless tobacco: Never Used  Substance and Sexual Activity  . Alcohol use: Yes    Alcohol/week: 36.0 standard drinks    Types: 36 Cans of beer per week    Comment: 6 bottles/night  . Drug use: Never  . Sexual activity: Not on file

## 2020-06-12 LAB — COMPREHENSIVE METABOLIC PANEL
AG Ratio: 1.4 (calc) (ref 1.0–2.5)
ALT: 24 U/L (ref 9–46)
AST: 23 U/L (ref 10–35)
Albumin: 4.1 g/dL (ref 3.6–5.1)
Alkaline phosphatase (APISO): 82 U/L (ref 35–144)
BUN/Creatinine Ratio: 14 (calc) (ref 6–22)
BUN: 18 mg/dL (ref 7–25)
CO2: 26 mmol/L (ref 20–32)
Calcium: 9.4 mg/dL (ref 8.6–10.3)
Chloride: 101 mmol/L (ref 98–110)
Creat: 1.31 mg/dL — ABNORMAL HIGH (ref 0.70–1.25)
Globulin: 2.9 g/dL (calc) (ref 1.9–3.7)
Glucose, Bld: 233 mg/dL — ABNORMAL HIGH (ref 65–99)
Potassium: 5.1 mmol/L (ref 3.5–5.3)
Sodium: 137 mmol/L (ref 135–146)
Total Bilirubin: 0.4 mg/dL (ref 0.2–1.2)
Total Protein: 7 g/dL (ref 6.1–8.1)

## 2020-06-12 LAB — CBC
HCT: 41.4 % (ref 38.5–50.0)
Hemoglobin: 13.5 g/dL (ref 13.2–17.1)
MCH: 27.8 pg (ref 27.0–33.0)
MCHC: 32.6 g/dL (ref 32.0–36.0)
MCV: 85.4 fL (ref 80.0–100.0)
MPV: 9.6 fL (ref 7.5–12.5)
Platelets: 313 10*3/uL (ref 140–400)
RBC: 4.85 10*6/uL (ref 4.20–5.80)
RDW: 13.5 % (ref 11.0–15.0)
WBC: 5.9 10*3/uL (ref 3.8–10.8)

## 2020-06-12 LAB — SEDIMENTATION RATE: Sed Rate: 17 mm/h (ref 0–20)

## 2020-06-12 LAB — ANTI-NUCLEAR AB-TITER (ANA TITER): ANA Titer 1: 1:40 {titer} — ABNORMAL HIGH

## 2020-06-12 LAB — RHEUMATOID FACTOR: Rheumatoid fact SerPl-aCnc: <14 IU/mL (ref <14)

## 2020-06-12 LAB — C-REACTIVE PROTEIN: CRP: 3.9 mg/L (ref <8.0)

## 2020-06-12 LAB — ANA: Anti Nuclear Antibody (ANA): POSITIVE — AB

## 2020-06-14 LAB — SYNOVIAL FLUID ANALYSIS, COMPLETE
Basophils, %: 0 %
Eosinophils-Synovial: 0 % (ref 0–2)
Lymphocytes-Synovial Fld: 32 % (ref 0–74)
Monocyte/Macrophage: 37 % (ref 0–69)
Neutrophil, Synovial: 31 % — ABNORMAL HIGH (ref 0–24)
Synoviocytes, %: 0 % (ref 0–15)
WBC, Synovial: 281 cells/uL — ABNORMAL HIGH (ref <150)

## 2020-06-14 LAB — ANAEROBIC AND AEROBIC CULTURE
AER RESULT:: NO GROWTH
MICRO NUMBER:: 11831834
MICRO NUMBER:: 11831835
SPECIMEN QUALITY:: ADEQUATE
SPECIMEN QUALITY:: ADEQUATE

## 2020-06-17 ENCOUNTER — Other Ambulatory Visit: Payer: Self-pay

## 2020-06-17 ENCOUNTER — Ambulatory Visit
Admission: RE | Admit: 2020-06-17 | Discharge: 2020-06-17 | Disposition: A | Discharge status: Home / Self Care | Payer: Self-pay | Source: Ambulatory Visit | Attending: Surgical | Admitting: Surgical

## 2020-06-17 DIAGNOSIS — G8929 Other chronic pain: Secondary | ICD-10-CM

## 2020-06-17 DIAGNOSIS — M25561 Pain in right knee: Secondary | ICD-10-CM

## 2020-06-21 ENCOUNTER — Telehealth: Payer: Self-pay | Admitting: Orthopedic Surgery

## 2020-06-21 NOTE — Telephone Encounter (Signed)
Patient called. Says his foot is in pain. Would like something called in for pain. It is also swollen.

## 2020-06-21 NOTE — Telephone Encounter (Signed)
Please advise 

## 2020-06-21 NOTE — Telephone Encounter (Signed)
Recommend he discuss continued foot pain with Dr. August Saucer and we can determine best prescription from there. Recommend Tylenol in the meantime, can use voltaren gel as well. Creatinine elevated so recommend against Aleve/Motrin, etc

## 2020-06-22 ENCOUNTER — Ambulatory Visit (INDEPENDENT_AMBULATORY_CARE_PROVIDER_SITE_OTHER): Payer: Self-pay | Admitting: Orthopedic Surgery

## 2020-06-22 DIAGNOSIS — M25461 Effusion, right knee: Secondary | ICD-10-CM

## 2020-06-22 MED ORDER — PREDNISONE 20 MG PO TABS: ORAL_TABLET | ORAL | 0 refills | Status: DC

## 2020-06-23 ENCOUNTER — Encounter: Payer: Self-pay | Admitting: Orthopedic Surgery

## 2020-06-23 DIAGNOSIS — M25461 Effusion, right knee: Secondary | ICD-10-CM

## 2020-06-23 MED ORDER — BUPIVACAINE HCL 0.25 % IJ SOLN
4.0000 mL | INTRAMUSCULAR | Status: AC | PRN
  Administered 2020-06-23: 4 mL via INTRA_ARTICULAR

## 2020-06-23 MED ORDER — LIDOCAINE HCL 1 % IJ SOLN
5.0000 mL | INTRAMUSCULAR | Status: AC | PRN
  Administered 2020-06-23: 5 mL

## 2020-06-23 MED ORDER — BETAMETHASONE SOD PHOS & ACET 6 (3-3) MG/ML IJ SUSP
6.0000 mg | INTRAMUSCULAR | Status: AC | PRN
  Administered 2020-06-23: 6 mg via INTRA_ARTICULAR

## 2020-06-23 NOTE — Progress Notes (Signed)
Office Visit Note   Patient: Patrick Hawkins           Date of Birth: [birth year]           MRN: [id number] Visit Date: 06/22/2020 Requested by: No referring provider defined for this encounter. PCP: Pcp, No  Subjective: Chief Complaint  Patient presents with  . 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  Has a known history of gout.  Aspiration of the right knee last clinic visit did show gout crystals present.  He has had an MRI scan of the right knee which is reviewed today.  Does not take any medication for the problem.  Has had a history of gout in the past and was potentially recommended to be on allopurinol but it gave him GI symptoms and he decided not to take it.  When he has been [place] in the past and had gout attacks he would self medicate with prednisone which helped him significantly.  Typically he will take 40 mg twice a day for several days to ease the attack.  His gout attacks typically are quick onset and quickly resolved.  Current symptoms in the right knee have spanned over at least 2 to 3 months.  Also reports several week history of atraumatic onset left midfoot pain.  Denies any fevers or chills with this.              ROS: All systems reviewed are negative as they relate to the chief complaint within the history of present illness.  Patient denies  fevers or chills.   Assessment & Plan: Visit Diagnoses:  1. Effusion, right knee     Plan: Impression is subacute gout affecting multiple joints at this time.  Right knee is aspirated and injected for relief today.  Left foot also has warmth and swelling.  Radiographs are negative.  Plan to prescribe 4-day course of prednisone.  Knee is aspirated and injected with cortisone as well.  I think he needs to see Dr. Fernande Bras for a gout management strategy which is more long-term and not reactionary..  I will see him back as needed.  Follow-Up Instructions:  Return if symptoms worsen or fail to improve.   Orders:  Orders Placed This Encounter  Procedures  . Ambulatory referral to Rheumatology   Meds ordered this encounter  Medications  . predniSONE (DELTASONE) 20 MG tablet    Sig: 1 po bid x 2 days then 1 q d x 2 days    Dispense:  6 tablet    Refill:  0      Procedures: Large Joint Inj: R knee on 06/23/2020 7:52 AM Indications: diagnostic evaluation, joint swelling and pain Details: 18 G 1.5 in needle, superolateral approach  Arthrogram: No  Medications: 5 mL lidocaine 1 %; 4 mL bupivacaine 0.25 %; 6 mg betamethasone acetate-betamethasone sodium phosphate 6 (3-3) MG/ML Aspirate: 40 mL serous Outcome: tolerated well, no immediate complications Procedure, treatment alternatives, risks and benefits explained, specific risks discussed. Consent was given by the patient. Immediately prior to procedure a time out was called to verify the correct patient, procedure, equipment, support staff and site/side marked as required. Patient was prepped and draped in the usual sterile fashion.       Clinical Data: No additional findings.  Objective: Vital Signs: There were no vitals taken for this visit.  Physical Exam:   Constitutional: Patient appears well-developed  HEENT:  Head: Normocephalic Eyes:EOM are normal Neck: Normal range of motion Cardiovascular: Normal rate Pulmonary/chest: Effort normal Neurologic: Patient is alert Skin: Skin is warm Psychiatric: Patient has normal mood and affect    Ortho Exam: Ortho examination of the left foot demonstrates mild swelling and warmth around the midfoot region.  Ankle dorsiflexion plantarflexion inversion eversion strength is intact.  Pedal pulses palpable.  No fluctuance erythema or drainage present anywhere in the foot region.  Pedal pulses are palpable.  Sensation intact on the dorsal and plantar aspect of the foot.  Bilateral knees are examined.  Trace effusion left knee.  Mild  effusion right knee.  Collateral crucial ligaments are stable.  No groin pain with internal ex rotation of either leg.  Synovitis present in that right knee.  Extensor mechanism is intact bilaterally.  Gait is normal.  Specialty Comments:  No specialty comments available.  Imaging: No results found.   PMFS History: There are no problems to display for this patient.  Past Medical History:  Diagnosis Date  . Acid reflux   . Asthma   . Diabetes mellitus without complication ([facility])   . High cholesterol   . Hypertension     Family History  Problem Relation Age of Onset  . Diabetes Mother   . Diabetes Father     History reviewed. No pertinent surgical history. Social History   Occupational History  . Not on file  Tobacco Use  . Smoking status: Never Smoker  . Smokeless tobacco: Never Used  Substance and Sexual Activity  . Alcohol use: Yes    Alcohol/week: 36.0 standard drinks    Types: 36 Cans of beer per week    Comment: 6 bottles/night  . Drug use: Never  . Sexual activity: Not on file

## 2020-06-27 ENCOUNTER — Ambulatory Visit: Payer: Self-pay | Admitting: Orthopedic Surgery

## 2020-08-31 ENCOUNTER — Ambulatory Visit (HOSPITAL_COMMUNITY)
Admission: EM | Admit: 2020-08-31 | Discharge: 2020-08-31 | Disposition: A | Discharge status: Home / Self Care | Payer: Self-pay

## 2020-08-31 ENCOUNTER — Encounter (HOSPITAL_COMMUNITY): Payer: Self-pay | Admitting: Cardiology

## 2020-08-31 ENCOUNTER — Ambulatory Visit (HOSPITAL_COMMUNITY)
Admission: EM | Disposition: A | Discharge status: Home / Self Care | Payer: Self-pay | Source: Home / Self Care | Attending: Emergency Medicine

## 2020-08-31 ENCOUNTER — Observation Stay (HOSPITAL_COMMUNITY)
Admission: EM | Admit: 2020-08-31 | Discharge: 2020-09-01 | Disposition: A | Discharge status: Home / Self Care | Payer: Self-pay | Attending: Emergency Medicine | Admitting: Emergency Medicine

## 2020-08-31 ENCOUNTER — Other Ambulatory Visit: Payer: Self-pay

## 2020-08-31 ENCOUNTER — Encounter (HOSPITAL_COMMUNITY): Payer: Self-pay

## 2020-08-31 DIAGNOSIS — Z20822 Contact with and (suspected) exposure to covid-19: Secondary | ICD-10-CM | POA: Insufficient documentation

## 2020-08-31 DIAGNOSIS — R079 Chest pain, unspecified: Secondary | ICD-10-CM | POA: Diagnosis present

## 2020-08-31 DIAGNOSIS — N189 Chronic kidney disease, unspecified: Secondary | ICD-10-CM

## 2020-08-31 DIAGNOSIS — D649 Anemia, unspecified: Secondary | ICD-10-CM

## 2020-08-31 DIAGNOSIS — J45909 Unspecified asthma, uncomplicated: Secondary | ICD-10-CM | POA: Insufficient documentation

## 2020-08-31 DIAGNOSIS — I10 Essential (primary) hypertension: Secondary | ICD-10-CM | POA: Insufficient documentation

## 2020-08-31 DIAGNOSIS — T783XXA Angioneurotic edema, initial encounter: Principal | ICD-10-CM | POA: Diagnosis present

## 2020-08-31 DIAGNOSIS — Z7984 Long term (current) use of oral hypoglycemic drugs: Secondary | ICD-10-CM | POA: Insufficient documentation

## 2020-08-31 DIAGNOSIS — I213 ST elevation (STEMI) myocardial infarction of unspecified site: Secondary | ICD-10-CM | POA: Insufficient documentation

## 2020-08-31 DIAGNOSIS — F17290 Nicotine dependence, other tobacco product, uncomplicated: Secondary | ICD-10-CM | POA: Insufficient documentation

## 2020-08-31 DIAGNOSIS — N179 Acute kidney failure, unspecified: Secondary | ICD-10-CM | POA: Diagnosis present

## 2020-08-31 DIAGNOSIS — Z79899 Other long term (current) drug therapy: Secondary | ICD-10-CM | POA: Insufficient documentation

## 2020-08-31 DIAGNOSIS — E119 Type 2 diabetes mellitus without complications: Secondary | ICD-10-CM | POA: Insufficient documentation

## 2020-08-31 HISTORY — PX: LEFT HEART CATH AND CORONARY ANGIOGRAPHY: CATH118249

## 2020-08-31 HISTORY — DX: Gout, unspecified: M10.9

## 2020-08-31 HISTORY — DX: Chest pain, unspecified: R07.9

## 2020-08-31 LAB — CBC WITH DIFFERENTIAL/PLATELET
Abs Immature Granulocytes: 0.04 10*3/uL (ref 0.00–0.07)
Basophils Absolute: 0.1 10*3/uL (ref 0.0–0.1)
Basophils Relative: 1 %
Eosinophils Absolute: 0.2 10*3/uL (ref 0.0–0.5)
Eosinophils Relative: 3 %
HCT: 38 % — ABNORMAL LOW (ref 39.0–52.0)
Hemoglobin: 12.1 g/dL — ABNORMAL LOW (ref 13.0–17.0)
Immature Granulocytes: 1 %
Lymphocytes Relative: 23 %
Lymphs Abs: 1.6 10*3/uL (ref 0.7–4.0)
MCH: 28.2 pg (ref 26.0–34.0)
MCHC: 31.8 g/dL (ref 30.0–36.0)
MCV: 88.6 fL (ref 80.0–100.0)
Monocytes Absolute: 0.8 10*3/uL (ref 0.1–1.0)
Monocytes Relative: 11 %
Neutro Abs: 4.5 10*3/uL (ref 1.7–7.7)
Neutrophils Relative %: 61 %
Platelets: 271 10*3/uL (ref 150–400)
RBC: 4.29 MIL/uL (ref 4.22–5.81)
RDW: 13.5 % (ref 11.5–15.5)
WBC: 7.2 10*3/uL (ref 4.0–10.5)
nRBC: 0 % (ref 0.0–0.2)

## 2020-08-31 LAB — COMPREHENSIVE METABOLIC PANEL
ALT: 21 U/L (ref 0–44)
AST: 18 U/L (ref 15–41)
Albumin: 3.3 g/dL — ABNORMAL LOW (ref 3.5–5.0)
Alkaline Phosphatase: 77 U/L (ref 38–126)
Anion gap: 13 (ref 5–15)
BUN: 28 mg/dL — ABNORMAL HIGH (ref 8–23)
CO2: 21 mmol/L — ABNORMAL LOW (ref 22–32)
Calcium: 9.7 mg/dL (ref 8.9–10.3)
Chloride: 100 mmol/L (ref 98–111)
Creatinine, Ser: 2.02 mg/dL — ABNORMAL HIGH (ref 0.61–1.24)
GFR, Estimated: 37 mL/min — ABNORMAL LOW (ref >60)
Glucose, Bld: 182 mg/dL — ABNORMAL HIGH (ref 70–99)
Potassium: 4.2 mmol/L (ref 3.5–5.1)
Sodium: 134 mmol/L — ABNORMAL LOW (ref 135–145)
Total Bilirubin: 0.4 mg/dL (ref 0.3–1.2)
Total Protein: 7 g/dL (ref 6.5–8.1)

## 2020-08-31 LAB — APTT: aPTT: 29 seconds (ref 24–36)

## 2020-08-31 LAB — GLUCOSE, CAPILLARY
Glucose-Capillary: 180 mg/dL — ABNORMAL HIGH (ref 70–99)
Glucose-Capillary: 320 mg/dL — ABNORMAL HIGH (ref 70–99)
Glucose-Capillary: 248 mg/dL — ABNORMAL HIGH (ref 70–99)

## 2020-08-31 LAB — LIPID PANEL
Cholesterol: 175 mg/dL (ref 0–200)
HDL: 40 mg/dL — ABNORMAL LOW (ref >40)
LDL Cholesterol: 95 mg/dL (ref 0–99)
Total CHOL/HDL Ratio: 4.4 RATIO
Triglycerides: 201 mg/dL — ABNORMAL HIGH (ref <150)
VLDL: 40 mg/dL (ref 0–40)

## 2020-08-31 LAB — HEMOGLOBIN A1C
Hgb A1c MFr Bld: 8.1 % — ABNORMAL HIGH (ref 4.8–5.6)
Mean Plasma Glucose: 186 mg/dL

## 2020-08-31 LAB — PROTIME-INR
INR: 0.9 (ref 0.8–1.2)
Prothrombin Time: 12.3 seconds (ref 11.4–15.2)

## 2020-08-31 LAB — RESP PANEL BY RT-PCR (FLU A&B, COVID) ARPGX2
Influenza A by PCR: NEGATIVE
Influenza B by PCR: NEGATIVE
SARS Coronavirus 2 by RT PCR: NEGATIVE

## 2020-08-31 LAB — TROPONIN I (HIGH SENSITIVITY)
Troponin I (High Sensitivity): 6 ng/L (ref <18)
Troponin I (High Sensitivity): 5 ng/L (ref <18)

## 2020-08-31 SURGERY — LEFT HEART CATH AND CORONARY ANGIOGRAPHY
Anesthesia: LOCAL

## 2020-08-31 MED ORDER — DIPHENHYDRAMINE HCL 50 MG/ML IJ SOLN
50.0000 mg | Freq: Once | INTRAMUSCULAR | Status: AC
  Administered 2020-08-31: 50 mg via INTRAVENOUS
  Filled 2020-08-31: qty 1

## 2020-08-31 MED ORDER — NITROGLYCERIN 0.4 MG SL SUBL
SUBLINGUAL_TABLET | SUBLINGUAL | Status: AC
  Filled 2020-08-31: qty 1

## 2020-08-31 MED ORDER — FENTANYL CITRATE (PF) 100 MCG/2ML IJ SOLN
INTRAMUSCULAR | Status: AC
  Filled 2020-08-31: qty 2

## 2020-08-31 MED ORDER — ALBUTEROL SULFATE HFA 108 (90 BASE) MCG/ACT IN AERS: 2.0000 | INHALATION_SPRAY | Freq: Four times a day (QID) | RESPIRATORY_TRACT | 0 refills | Status: DC | PRN

## 2020-08-31 MED ORDER — HYDRALAZINE HCL 20 MG/ML IJ SOLN: 10.0000 mg | INTRAMUSCULAR | Status: AC | PRN

## 2020-08-31 MED ORDER — ONDANSETRON HCL 4 MG/2ML IJ SOLN: 4.0000 mg | Freq: Four times a day (QID) | INTRAMUSCULAR | Status: DC | PRN

## 2020-08-31 MED ORDER — SODIUM CHLORIDE 0.9 % WEIGHT BASED INFUSION: 1.0000 mL/kg/h | INTRAVENOUS | Status: DC

## 2020-08-31 MED ORDER — LIDOCAINE HCL (PF) 1 % IJ SOLN
INTRAMUSCULAR | Status: AC
  Filled 2020-08-31: qty 30

## 2020-08-31 MED ORDER — INSULIN ASPART 100 UNIT/ML IJ SOLN
0.0000 [IU] | Freq: Three times a day (TID) | INTRAMUSCULAR | Status: DC
  Administered 2020-08-31: 7 [IU] via SUBCUTANEOUS
  Administered 2020-08-31: 2 [IU] via SUBCUTANEOUS
  Administered 2020-09-01: 1 [IU] via SUBCUTANEOUS
  Administered 2020-09-01: 3 [IU] via SUBCUTANEOUS

## 2020-08-31 MED ORDER — INSULIN ASPART 100 UNIT/ML IJ SOLN
5.0000 [IU] | Freq: Once | INTRAMUSCULAR | Status: AC
  Administered 2020-08-31: 5 [IU] via SUBCUTANEOUS

## 2020-08-31 MED ORDER — ADULT MULTIVITAMIN W/MINERALS CH
1.0000 | ORAL_TABLET | Freq: Every day | ORAL | Status: DC
  Administered 2020-08-31 – 2020-09-01 (×2): 1 via ORAL
  Filled 2020-08-31 (×2): qty 1

## 2020-08-31 MED ORDER — THIAMINE HCL 100 MG PO TABS
100.0000 mg | ORAL_TABLET | Freq: Every day | ORAL | Status: DC
  Administered 2020-08-31 – 2020-09-01 (×2): 100 mg via ORAL
  Filled 2020-08-31 (×2): qty 1

## 2020-08-31 MED ORDER — SODIUM CHLORIDE 0.9 % WEIGHT BASED INFUSION
1.0000 mL/kg/h | INTRAVENOUS | Status: DC
  Administered 2020-08-31: 1 mL/kg/h via INTRAVENOUS

## 2020-08-31 MED ORDER — NITROGLYCERIN 0.4 MG SL SUBL
0.4000 mg | SUBLINGUAL_TABLET | SUBLINGUAL | Status: DC | PRN
  Administered 2020-08-31: 0.4 mg via SUBLINGUAL

## 2020-08-31 MED ORDER — NITROGLYCERIN 1 MG/10 ML FOR IR/CATH LAB
INTRA_ARTERIAL | Status: AC
  Filled 2020-08-31: qty 10

## 2020-08-31 MED ORDER — SODIUM CHLORIDE 0.9% FLUSH: 3.0000 mL | INTRAVENOUS | Status: DC | PRN

## 2020-08-31 MED ORDER — HEPARIN SODIUM (PORCINE) 1000 UNIT/ML IJ SOLN
INTRAMUSCULAR | Status: AC
  Filled 2020-08-31: qty 1

## 2020-08-31 MED ORDER — HEPARIN SODIUM (PORCINE) 5000 UNIT/ML IJ SOLN
4000.0000 [IU] | Freq: Once | INTRAMUSCULAR | Status: AC
  Administered 2020-08-31: 4000 [IU] via INTRAVENOUS

## 2020-08-31 MED ORDER — FAMOTIDINE IN NACL 20-0.9 MG/50ML-% IV SOLN
20.0000 mg | Freq: Two times a day (BID) | INTRAVENOUS | Status: DC
  Administered 2020-08-31: 20 mg via INTRAVENOUS
  Filled 2020-08-31 (×2): qty 50

## 2020-08-31 MED ORDER — FAMOTIDINE 20 MG PO TABS
20.0000 mg | ORAL_TABLET | Freq: Two times a day (BID) | ORAL | Status: DC
  Administered 2020-08-31 – 2020-09-01 (×2): 20 mg via ORAL
  Filled 2020-08-31 (×2): qty 1

## 2020-08-31 MED ORDER — HEPARIN (PORCINE) IN NACL 1000-0.9 UT/500ML-% IV SOLN
INTRAVENOUS | Status: AC
  Filled 2020-08-31: qty 500

## 2020-08-31 MED ORDER — PREDNISONE 20 MG PO TABS
40.0000 mg | ORAL_TABLET | Freq: Every day | ORAL | Status: DC
  Administered 2020-09-01: 40 mg via ORAL
  Filled 2020-08-31: qty 2

## 2020-08-31 MED ORDER — FENTANYL CITRATE (PF) 100 MCG/2ML IJ SOLN
INTRAMUSCULAR | Status: DC | PRN
  Administered 2020-08-31: 25 ug via INTRAVENOUS

## 2020-08-31 MED ORDER — THIAMINE HCL 100 MG/ML IJ SOLN: 100.0000 mg | Freq: Every day | INTRAMUSCULAR | Status: DC

## 2020-08-31 MED ORDER — LABETALOL HCL 5 MG/ML IV SOLN: 10.0000 mg | INTRAVENOUS | Status: AC | PRN

## 2020-08-31 MED ORDER — ACETAMINOPHEN 325 MG PO TABS: 650.0000 mg | ORAL_TABLET | ORAL | Status: DC | PRN

## 2020-08-31 MED ORDER — IOHEXOL 350 MG/ML SOLN
INTRAVENOUS | Status: DC | PRN
  Administered 2020-08-31: 45 mL

## 2020-08-31 MED ORDER — METHYLPREDNISOLONE SODIUM SUCC 125 MG IJ SOLR
80.0000 mg | Freq: Once | INTRAMUSCULAR | Status: AC
  Administered 2020-08-31: 80 mg via INTRAVENOUS
  Filled 2020-08-31: qty 2

## 2020-08-31 MED ORDER — SODIUM CHLORIDE 0.9% FLUSH
3.0000 mL | Freq: Two times a day (BID) | INTRAVENOUS | Status: DC
  Administered 2020-09-01: 3 mL via INTRAVENOUS

## 2020-08-31 MED ORDER — LORAZEPAM 2 MG/ML IJ SOLN: 1.0000 mg | INTRAMUSCULAR | Status: DC | PRN

## 2020-08-31 MED ORDER — HEPARIN (PORCINE) IN NACL 1000-0.9 UT/500ML-% IV SOLN
INTRAVENOUS | Status: AC
  Filled 2020-08-31: qty 1000

## 2020-08-31 MED ORDER — MIDAZOLAM HCL 2 MG/2ML IJ SOLN
INTRAMUSCULAR | Status: DC | PRN
  Administered 2020-08-31: 2 mg via INTRAVENOUS

## 2020-08-31 MED ORDER — VERAPAMIL HCL 2.5 MG/ML IV SOLN
INTRAVENOUS | Status: AC
  Filled 2020-08-31: qty 2

## 2020-08-31 MED ORDER — LIDOCAINE HCL (PF) 1 % IJ SOLN
INTRAMUSCULAR | Status: DC | PRN
  Administered 2020-08-31: 2 mL

## 2020-08-31 MED ORDER — LORATADINE 10 MG PO TABS: 20.0000 mg | ORAL_TABLET | Freq: Two times a day (BID) | ORAL | Status: DC

## 2020-08-31 MED ORDER — SODIUM CHLORIDE 0.9 % IV SOLN: INTRAVENOUS | Status: DC

## 2020-08-31 MED ORDER — SODIUM CHLORIDE 0.9 % IV SOLN: 250.0000 mL | INTRAVENOUS | Status: DC | PRN

## 2020-08-31 MED ORDER — FOLIC ACID 1 MG PO TABS
1.0000 mg | ORAL_TABLET | Freq: Every day | ORAL | Status: DC
  Administered 2020-08-31 – 2020-09-01 (×2): 1 mg via ORAL
  Filled 2020-08-31 (×2): qty 1

## 2020-08-31 MED ORDER — MIDAZOLAM HCL 2 MG/2ML IJ SOLN
INTRAMUSCULAR | Status: AC
  Filled 2020-08-31: qty 2

## 2020-08-31 MED ORDER — ASPIRIN 81 MG PO CHEW
324.0000 mg | CHEWABLE_TABLET | Freq: Once | ORAL | Status: AC
  Administered 2020-08-31: 324 mg via ORAL

## 2020-08-31 MED ORDER — VERAPAMIL HCL 2.5 MG/ML IV SOLN
INTRAVENOUS | Status: DC | PRN
  Administered 2020-08-31: 10 mL via INTRA_ARTERIAL

## 2020-08-31 MED ORDER — LORAZEPAM 1 MG PO TABS: 1.0000 mg | ORAL_TABLET | ORAL | Status: DC | PRN

## 2020-08-31 MED ORDER — HEPARIN (PORCINE) IN NACL 1000-0.9 UT/500ML-% IV SOLN
INTRAVENOUS | Status: DC | PRN
  Administered 2020-08-31 (×2): 500 mL

## 2020-08-31 MED ORDER — LORATADINE 10 MG PO TABS
10.0000 mg | ORAL_TABLET | Freq: Every day | ORAL | Status: DC
  Administered 2020-08-31: 10 mg via ORAL
  Filled 2020-08-31: qty 1

## 2020-08-31 MED ORDER — FAMOTIDINE 20 MG PO TABS: 20.0000 mg | ORAL_TABLET | Freq: Two times a day (BID) | ORAL | Status: DC

## 2020-08-31 SURGICAL SUPPLY — 10 items
CATH 5FR JL3.5 JR4 ANG PIG MP (CATHETERS) ×2 IMPLANT
DEVICE RAD COMP TR BAND LRG (VASCULAR PRODUCTS) ×2 IMPLANT
GLIDESHEATH SLEND SS 6F .021 (SHEATH) ×2 IMPLANT
GUIDEWIRE INQWIRE 1.5J.035X260 (WIRE) ×1 IMPLANT
INQWIRE 1.5J .035X260CM (WIRE) ×2
KIT ENCORE 26 ADVANTAGE (KITS) ×2 IMPLANT
KIT HEART LEFT (KITS) ×2 IMPLANT
PACK CARDIAC CATHETERIZATION (CUSTOM PROCEDURE TRAY) ×2 IMPLANT
TRANSDUCER W/STOPCOCK (MISCELLANEOUS) ×2 IMPLANT
TUBING CIL FLEX 10 FLL-RA (TUBING) ×2 IMPLANT

## 2020-08-31 NOTE — ED Notes (Signed)
[facility] was called, they did not have a truck available.   911 was called. Emergency transport requested and cardiac monitor requested. 911 was called 2nd time and cancelled as pt refused to go by EMS after calling.   ED charge nurse called and reported pt wilL go by EMS and the called again by charge nurse and reported pt will go POV with wife,as he refused to go by EMS.

## 2020-08-31 NOTE — ED Provider Notes (Addendum)
[facility] EMERGENCY DEPARTMENT Provider Note   CSN: [id number] Arrival date & time: 08/31/20  3299     History Chief Complaint  Patient presents with   Oral Swelling    Patrick Hawkins is a 60 y.o. male.  Presents with complaint of swelling to upper and lower lips that he noticed this morning.  He is not sure what the cause was.  He states that he ate out at [employer] yesterday.  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  Otherwise he has an additional complaint of chest pain.  He states has been having chest pain for about 3 days.  He states that he had a severe episode of chest pain about 3 days ago when he was exerting himself pushing a cart, pain at that time was more severe mid chest nonradiating lasted about 5 to minutes and then resolved.  Since then has been having low-grade chest discomfort, yesterday he again exerted himself carrying some boxes and had increased mid chest pain that lasted 5 to 10 minutes and then since resolved.  He continues to have low-grade chest discomfort this morning.  Denies any prior cardiac event.  Does have a history of high blood pressure diabetes and high cholesterol.  Patient is also on lisinopril.      Past Medical History:  Diagnosis Date   Acid reflux    Asthma    Diabetes mellitus without complication (HCC)    High cholesterol    Hypertension     There are no problems to display for this patient.   No past surgical history on file.     Family History  Problem Relation Age of Onset   Diabetes Mother    Diabetes Father     Social History   Tobacco Use   Smoking status: Never   Smokeless tobacco: Never  Substance Use Topics   Alcohol use: Yes    Alcohol/week: 36.0 standard drinks    Types: 36 Cans of beer per week    Comment: 6 bottles/night   Drug use: Never    Home Medications Prior to Admission medications   Medication Sig  Start Date End Date Taking? Authorizing Provider  lisinopril (ZESTRIL) 20 MG tablet Take 1 tablet (20 mg total) by mouth daily. 03/08/20 08/31/20  Merrilee Jansky, MD  metFORMIN (GLUCOPHAGE) 1000 MG tablet Take 1 tablet (1,000 mg total) by mouth 2 (two) times daily with a meal. 03/08/20 06/06/20  Lamptey, Britta Mccreedy, MD  montelukast (SINGULAIR) 10 MG tablet Take 1 tablet (10 mg total) by mouth at bedtime. 05/30/19   Lamptey, Britta Mccreedy, MD  olopatadine (PATANOL) 0.1 % ophthalmic solution Place 1 drop into both eyes 2 (two) times daily. 05/30/19   Merrilee Jansky, MD  predniSONE (DELTASONE) 20 MG tablet 1 po bid x 2 days then 1 q d x 2 days 06/22/20   Cammy Copa, MD  sildenafil (VIAGRA) 100 MG tablet Take 1 tablet (100 mg total) by mouth daily as needed for erectile dysfunction. 03/08/20   Merrilee Jansky, MD  simvastatin (ZOCOR) 10 MG tablet Take 10 mg by mouth daily.    [provider]  triamcinolone (KENALOG) 0.1 % Apply 1 application topically 2 (two) times daily. 05/03/20   Merrilee Jansky, MD  tadalafil (CIALIS) 10 MG tablet Take 10 mg by mouth daily as needed for erectile dysfunction.  03/08/20  [provider]  traZODone (DESYREL) 50 MG tablet Take 1 tablet (50 mg total) by mouth at bedtime. 05/30/19 03/08/20  Merrilee Jansky, MD    Allergies    Cat hair extract and Shellfish allergy  Review of Systems   Review of Systems  Constitutional:  Negative for fever.  HENT:  Negative for ear pain and sore throat.   Eyes:  Negative for pain.  Respiratory:  Negative for cough.   Cardiovascular:  Positive for chest pain.  Gastrointestinal:  Negative for abdominal pain.  Genitourinary:  Negative for flank pain.  Musculoskeletal:  Negative for back pain.  Skin:  Negative for color change and rash.  Neurological:  Negative for syncope.  All other systems reviewed and are negative.  Physical Exam Updated Vital Signs BP 132/83   Pulse 72   Temp 98.2 F (36.8 C) (Oral)    Resp 15   Ht 5\' 10"  (1.778 m)   Wt 79.4 kg   SpO2 100%   BMI 25.11 kg/m   Physical Exam Constitutional:      General: He is not in acute distress.    Appearance: He is well-developed.  HENT:     Head: Normocephalic.     Nose: Nose normal.  Eyes:     Extraocular Movements: Extraocular movements intact.  Cardiovascular:     Rate and Rhythm: Normal rate.  Pulmonary:     Effort: Pulmonary effort is normal.  Skin:    Coloration: Skin is not jaundiced.     Comments: Bilateral upper and lower lip swelling.  No airway impingement or difficulty breathing noted.  No stridor noted.  Neurological:     Mental Status: He is alert. Mental status is at baseline.    ED Results / Procedures / Treatments   Labs (all labs ordered are listed, but only abnormal results are displayed) Labs Reviewed  CBC WITH DIFFERENTIAL/PLATELET - Abnormal; Notable for the following components:      Result Value   Hemoglobin 12.1 (*)    HCT 38.0 (*)    All other components within normal limits  RESP PANEL BY RT-PCR (FLU A&B, COVID) ARPGX2  HEMOGLOBIN A1C  PROTIME-INR  APTT  COMPREHENSIVE METABOLIC PANEL  LIPID PANEL  TROPONIN I (HIGH SENSITIVITY)    EKG None  Radiology No results found.  Procedures .Critical Care  Date/Time: 08/31/2020 9:30 AM Performed by: 09/02/2020, MD Authorized by: Cheryll Cockayne, MD   Critical care provider statement:    Critical care time (minutes):  40   Critical care time was exclusive of:  Separately billable procedures and treating other patients and teaching time   Critical care was necessary to treat or prevent imminent or life-threatening deterioration of the following conditions:  Cardiac failure Comments:     STEMI   Medications Ordered in ED Medications  nitroGLYCERIN (NITROSTAT) SL tablet 0.4 mg (0.4 mg Sublingual Given 08/31/20 0901)  aspirin chewable tablet 324 mg (324 mg Oral Given 08/31/20 0857)  methylPREDNISolone sodium succinate (SOLU-MEDROL)  125 mg/2 mL injection 80 mg (80 mg Intravenous Given 08/31/20 0907)  diphenhydrAMINE (BENADRYL) injection 50 mg (50 mg Intravenous Given 08/31/20 0917)  heparin injection 4,000 Units (4,000 Units Intravenous Given 08/31/20 09/02/20)    ED Course  I have reviewed the triage vital signs and the nursing notes.  Pertinent labs & imaging results that were available during my care of the patient were reviewed by me and considered in my medical decision making (see chart for details).  MDM Rules/Calculators/A&P                           Patient presented initially with concern for lip swelling and additional complaint of chest pain.  EKG however is concerning for ST elevation MI.  I see ST elevations in lead I and aVL.  ST depressions are seen in inferior leads.  Last prior EKGs in 2020 with similar but much milder ST depression seen in inferior leads but no ST elevations noted in this prior EKG.  Aspirin full dose as well as nitro sublingual.  Pending cardiology evaluation.  Final Clinical Impression(s) / ED Diagnoses Final diagnoses:  Angioedema of lips, initial encounter  ST elevation myocardial infarction (STEMI), unspecified artery [facility])    Rx / DC Orders ED Discharge Orders     None        Cheryll Cockayne, MD 08/31/20 2035    Cheryll Cockayne, MD 08/31/20 0930

## 2020-08-31 NOTE — ED Notes (Signed)
[facility] called to activate STEMI per Dr.Hong

## 2020-08-31 NOTE — Consult Note (Signed)
[facility] Medical Consultation  Reiner Loewen YQM:[id number] DOB: 1960/08/08 DOA: 08/31/2020 PCP: Aviva Kluver   Requesting physician: Peter [place], MD Date of consultation:  Reason for consultation: Angioedema  Impression/Recommendations Active Problems:   Chest pain at rest     Abnormal EKG chest pain: Patient had been noted to have a abnormal EKG at urgent care.  EKG here was concerning for possible lateral STEMI.  Patient had been given full dose aspirin IV heparin bolus plus IV Solu-Medrol, and Benadryl.  He had been taken for left heart cath which revealed significant obstructive coronary artery disease with mild irregularity of the proximal and mid LAD with no significant stenosis. -Per cardiology  Angioedema: Patient presents with complaints of facial swelling at lips without tongue or throat involvement.  Reported concern for cross-contamination of his food that he ate last night which prescription.  However patient also recently restarted lisinopril approximately 2 days ago.  Suspect symptoms are more likely associated with lisinopril as previous reaction with shrimp reported as throat swelling. -Prednisone 40 mg taper over next 4 days -Pepcid 20 mg twice daily -Claritin 20 mg twice daily -Discontinue lisinopril  Acute kidney injury superimposed chronic kidney disease stage II: On admission patient was noted to be afebrile with creatinine 2.02 with BUN 28.  Baseline creatinine previously been around 1.3.  Possibly multifactorial in nature given reports of NSAID use. -changed IV fluids to 125 mL/h -Recheck kidney function in a.m. -Avoid nephrotoxic age  Essential hypertension: Home medications include lisinopril. -Discontinue lisinopril  Hyperlipidemia: LDL 95 -Statin per cardiology  Diabetes mellitus type 2: Patient had been -Hypoglycemic protocol -Check hemoglobin A1c -Hold metformin -CBGs before every meal and at bedtime -Diabetic education  consulted  History of Gout -Avoid NSAIDs due to AKI  Normocytic anemia: Hemoglobin 12.1 which appears around patient's baseline. -Recheck H&H tomorrow morning  Need of PCP -Transitions of care consult  Alcohol abuse: Patient reports drinking significant amount of alcohol couple days out of the week, but denies any history of alcohol withdrawals. -CIWA protocols with Ativan as needed   I will followup again tomorrow. Please contact me if I can be of assistance in the meanwhile. Thank you for this consultation.  Chief Complaint: Facial swelling  HPI:  Patrick Hawkins is a 60 y.o. male with medical history significant of hypertension, hyperlipidemia, diabetes mellitus type 2, asthma, and allergies who presents with complaints of facial swelling which initially started yesterday evening.  He had eaten  [employer] last night and had a chicken wrap with pork dumplings.  Later on that evening he was noted to have some mild swelling of his upper lip, but this morning woke up with significantly more swelling of both lips.  Denies any tongue or throat involvement.  He initially thought maybe there was cross-contamination with shrimp and something that he ate.  However, patient reports with previous allergic reaction to strep he had throat swelling and denied having any.  He also had recently restarted lisinopril 2 days ago after being without it for approximately 2 weeks or maybe longer, but reports that he has been on the medication for quite some time and never had any issue previously.  However, patient also reported that for the last 3 days he has had intermittent left-sided chest tightness with some mild shortness of breath.  He has multiple allergies and thought possibly that he was allergic to something in the air such as cats or dust.  He had a initially went to urgent care and  was noted to have abnormal EKG. Labs were significant for hemoglobin 12.1, BUN 28, creatinine 2.02, and high-sensitivity  troponin negative x2.  His initial EKG was concerning for acute lateral STEMI and was formally admitted by cardiology.  Patient taken in for a cardiac catheterization which did not reveal any significant coronary artery disease.  Upon further evaluation of the patient he does admit that he has been having gout pain and has been taking 800 mg of ibuprofen regularly.  He complained of having some abdominal discomfort, but denied having any bloody stools.  He does report drinking anywhere from 3 beers and 3 shots of whiskey a couple days out of the week, but denies any history of alcohol withdrawals.   Review of Systems  Respiratory:  Positive for shortness of breath. Negative for cough.   Cardiovascular:  Positive for chest pain. Negative for leg swelling.  Gastrointestinal:  Positive for abdominal pain. Negative for blood in stool, nausea and vomiting.  Musculoskeletal:  Positive for joint pain. Negative for falls.  Skin:        Positive for facial swelling  Neurological:  Negative for focal weakness and loss of consciousness.  Endo/Heme/Allergies:  Positive for environmental allergies.    Past Medical History:  Diagnosis Date   Acid reflux    Asthma    Diabetes mellitus without complication (HCC)    Gout    High cholesterol    Hypertension    No past surgical history on file. Social History:  reports that he has been smoking cigars. He has never used smokeless tobacco. He reports current alcohol use of about 36.0 standard drinks of alcohol per week. He reports that he does not use drugs.  Allergies  Allergen Reactions   Cat Hair Extract Shortness Of Breath   Shellfish Allergy Anaphylaxis and Swelling    Other reaction(s): Angioedema   Ace Inhibitors Swelling   Family History  Problem Relation Age of Onset   Diabetes Mother    Diabetes Father     Prior to Admission medications   Medication Sig Start Date End Date Taking? Authorizing Provider  metFORMIN (GLUCOPHAGE) 1000 MG  tablet Take 1 tablet (1,000 mg total) by mouth 2 (two) times daily with a meal. 03/08/20 06/06/20  Lamptey, Britta Mccreedy, MD  montelukast (SINGULAIR) 10 MG tablet Take 1 tablet (10 mg total) by mouth at bedtime. 05/30/19   Lamptey, Britta Mccreedy, MD  olopatadine (PATANOL) 0.1 % ophthalmic solution Place 1 drop into both eyes 2 (two) times daily. 05/30/19   Merrilee Jansky, MD  predniSONE (DELTASONE) 20 MG tablet 1 po bid x 2 days then 1 q d x 2 days 06/22/20   Cammy Copa, MD  sildenafil (VIAGRA) 100 MG tablet Take 1 tablet (100 mg total) by mouth daily as needed for erectile dysfunction. 03/08/20   Merrilee Jansky, MD  simvastatin (ZOCOR) 10 MG tablet Take 10 mg by mouth daily.    [provider]  triamcinolone (KENALOG) 0.1 % Apply 1 application topically 2 (two) times daily. 05/03/20   Merrilee Jansky, MD  tadalafil (CIALIS) 10 MG tablet Take 10 mg by mouth daily as needed for erectile dysfunction.  03/08/20  [provider]  traZODone (DESYREL) 50 MG tablet Take 1 tablet (50 mg total) by mouth at bedtime. 05/30/19 03/08/20  Merrilee Jansky, MD   Physical Exam:  Constitutional: NAD, calm, comfortable Vitals:   08/31/20 0840 08/31/20 0900 08/31/20 0933 08/31/20 1011  BP:  132/83  107/67  Pulse:  72  80  Resp:  15  16  Temp:      TempSrc:      SpO2:  100% 98%   Weight: 79.4 kg     Height: 5\' 10"  (1.778 m)      Eyes: PERRL, lids and conjunctivae normal ENMT: Mucous membranes are moist. Posterior pharynx clear of any exudate or lesions.Normal dentition.  Neck: normal, supple, no masses, no thyromegaly Respiratory: clear to auscultation bilaterally, no wheezing, no crackles. Normal respiratory effort. No accessory muscle use.  Cardiovascular: Regular rate and rhythm, no murmurs / rubs / gallops. No extremity edema. 2+ pedal pulses. No carotid bruits.  Abdomen: no tenderness, no masses palpated. No hepatosplenomegaly. Bowel sounds positive.  Musculoskeletal: no clubbing /  cyanosis. No joint deformity upper and lower extremities. Good ROM, no contractures. Normal muscle tone.  Skin: no rashes, lesions, ulcers. No induration Neurologic: CN 2-12 grossly intact. Sensation intact, DTR normal. Strength 5/5 in all 4.  Psychiatric: Normal judgment and insight. Alert and oriented x 3. Normal mood.   Labs on Admission:  Basic Metabolic Panel: Recent Labs  Lab 08/31/20 0859  NA 134*  K 4.2  CL 100  CO2 21*  GLUCOSE 182*  BUN 28*  CREATININE 2.02*  CALCIUM 9.7   Liver Function Tests: Recent Labs  Lab 08/31/20 0859  AST 18  ALT 21  ALKPHOS 77  BILITOT 0.4  PROT 7.0  ALBUMIN 3.3*   No results for input(s): LIPASE, AMYLASE in the last 168 hours. No results for input(s): AMMONIA in the last 168 hours. CBC: Recent Labs  Lab 08/31/20 0859  WBC 7.2  NEUTROABS 4.5  HGB 12.1*  HCT 38.0*  MCV 88.6  PLT 271   Cardiac Enzymes: No results for input(s): CKTOTAL, CKMB, CKMBINDEX, TROPONINI in the last 168 hours. BNP: Invalid input(s): POCBNP CBG: No results for input(s): GLUCAP in the last 168 hours.  Radiological Exams on Admission: CARDIAC CATHETERIZATION  Result Date: 08/31/2020 Formatting of this result is different from the original.   The left ventricular systolic function is normal.   LV end diastolic pressure is normal.   The left ventricular ejection fraction is 55-65% by visual estimate. 1.  Patent coronary arteries with no significant obstructive disease.  Mild irregularity of the proximal and mid LAD with no significant stenosis.  Angiographically normal left main, left circumflex, ramus intermedius, and RCA. 2.  Normal LV systolic function with no regional wall motion abnormalities and LVEF estimated at 55 to 60%, normal LVEDP Recommend: Overnight observation, ongoing treatment of angioedema, fortunately the patient has no significant CAD or active cardiac disease noted on this study.     Time spent: >45 minutes  Perlita Forbush A Sharmeka Palmisano [facility]   If 7PM-7AM, please contact night-coverage

## 2020-08-31 NOTE — ED Triage Notes (Signed)
Patient is being discharged from the Urgent Care and sent to the Emergency Department via personal vehicle with wife (pt refuses EMS) . Per Provider, patient is in need of higher level of care due to Chest pain and changes in EKG. Patient and wife are aware and verbalizes understanding of plan of care.  Vitals:   08/31/20 0813  BP: 135/79  Pulse: 80  Resp: 20  Temp: 98 F (36.7 C)  SpO2: 100%

## 2020-08-31 NOTE — Progress Notes (Signed)
   08/31/20 0920  Clinical Encounter Type  Visited With Family  Visit Type Code Valinda Party)  Referral From Nurse  Consult/Referral To Chaplain   Chaplain responded to code stemi. I provided the ministry of presence and emotional support to his tearful wife, Yordi. The patient is in the Cath lab, and she stated she was fine being alone. I advised her to inform the nurse if a chaplain is needed.  This note was prepared by Deneen Harts, M.Div..  For questions please contact by phone [phone].

## 2020-08-31 NOTE — Progress Notes (Signed)
Inpatient Diabetes Program Recommendations  AACE/ADA: New Consensus Statement on Inpatient Glycemic Control (2015)  Target Ranges:  Prepandial:   less than 140 mg/dL      Peak postprandial:   less than 180 mg/dL (1-2 hours)      Critically ill patients:  140 - 180 mg/dL   Lab Results  Component Value Date   GLUCAP 180 (H) 08/31/2020    Review of Glycemic Control Results for Patrick Hawkins, Patrick Hawkins (MRN [id number]) as of 08/31/2020 15:23  Ref. Range 08/31/2020 11:57  Glucose-Capillary Latest Ref Range: 70 - 99 mg/dL 778 (H)   Diabetes history: DM2 Outpatient Diabetes medications: Metformin 1000 mg BID Current orders for Inpatient glycemic control: Novolog 0-9 units TID  Referral received for "previously maintained on Metformin".  A1C is pending.  Agree with current regimen.  Will follow and check back on A1C.    Will continue to follow while inpatient.  Thank you, Dulce Sellar, RN, BSN Diabetes Coordinator Inpatient Diabetes Program [phone] (team pager from 8a-5p)

## 2020-08-31 NOTE — ED Notes (Signed)
Pt declining EMS transport.  States his wife will drive him immediately to ED.  Loraine Leriche, ED Charge RN, notified that pt will no longer be transported via EMS, but will be arriving via private vehicle; RN verbalized understanding.

## 2020-08-31 NOTE — ED Triage Notes (Signed)
Pt sent from UC with facial swelling. Pt has facial swelling that worsened this morning. Endorses chest tightness. Pt reports he is on lisinopril and allergic to shrimp, eaten at [employer] last night.

## 2020-08-31 NOTE — H&P (Signed)
Cardiology Admission History and Physical:   Patient ID: Patrick Hawkins MRN: [id number]; DOB: [birth year]   Admission date: 08/31/2020  PCP:  Pcp, No   [facility] Providers Cardiologist:  None  New Click here to update MD or APP on Care Team, Refresh:1}     Chief Complaint:  chest tightness, facial swelling  Patient Profile:   Patrick Hawkins is a 60 y.o. male with history of HTN, DM  who is being seen 08/31/2020 for the evaluation of chest tightness and abnormal Ecg.  History of Present Illness:   [name] has no prior cardiac history. He has a history of DM and HTN. Lipid status is unknown. Reports 3 day history of intermittent chest tightness. Last night developed facial swelling. Thought this was related to shrimp allergy as he ate out last night and thinks his food may have been cross contaminated with shrimp. He also notes he had been on lisinopril in the past. Quit taking a long time ago but recently found a bottle and so started back on it 2 days ago. Currently having active left precordial chest tightness. No SOB or radiation. No diaphoresis.    Past Medical History:  Diagnosis Date   Acid reflux    Asthma    Diabetes mellitus without complication (HCC)    Gout    High cholesterol    Hypertension     No past surgical history on file.   Medications Prior to Admission: Prior to Admission medications   Medication Sig Start Date End Date Taking? Authorizing Provider  lisinopril (ZESTRIL) 20 MG tablet Take 1 tablet (20 mg total) by mouth daily. 03/08/20 08/31/20  Merrilee Jansky, MD  metFORMIN (GLUCOPHAGE) 1000 MG tablet Take 1 tablet (1,000 mg total) by mouth 2 (two) times daily with a meal. 03/08/20 06/06/20  Lamptey, Britta Mccreedy, MD  montelukast (SINGULAIR) 10 MG tablet Take 1 tablet (10 mg total) by mouth at bedtime. 05/30/19   Lamptey, Britta Mccreedy, MD  olopatadine (PATANOL) 0.1 % ophthalmic solution Place 1 drop into both eyes 2 (two) times daily. 05/30/19   Merrilee Jansky, MD  predniSONE (DELTASONE) 20 MG tablet 1 po bid x 2 days then 1 q d x 2 days 06/22/20   Cammy Copa, MD  sildenafil (VIAGRA) 100 MG tablet Take 1 tablet (100 mg total) by mouth daily as needed for erectile dysfunction. 03/08/20   Merrilee Jansky, MD  simvastatin (ZOCOR) 10 MG tablet Take 10 mg by mouth daily.    [provider]  triamcinolone (KENALOG) 0.1 % Apply 1 application topically 2 (two) times daily. 05/03/20   Merrilee Jansky, MD  tadalafil (CIALIS) 10 MG tablet Take 10 mg by mouth daily as needed for erectile dysfunction.  03/08/20  [provider]  traZODone (DESYREL) 50 MG tablet Take 1 tablet (50 mg total) by mouth at bedtime. 05/30/19 03/08/20  Merrilee Jansky, MD     Allergies:    Allergies  Allergen Reactions   Cat Hair Extract Shortness Of Breath   Shellfish Allergy Anaphylaxis and Swelling    Other reaction(s): Angioedema    Social History:   Social History   Socioeconomic History   Marital status: Single    Spouse name: Not on file   Number of children: Not on file   Years of education: Not on file   Highest education level: Not on file  Occupational History   Not on file  Tobacco Use   Smoking status: Some Days  Types: Cigars   Smokeless tobacco: Never  Substance and Sexual Activity   Alcohol use: Yes    Alcohol/week: 36.0 standard drinks    Types: 36 Cans of beer per week    Comment: 6 bottles/night   Drug use: Never   Sexual activity: Not on file  Other Topics Concern   Not on file  Social History Narrative   Not on file   Social Determinants of Health   Financial Resource Strain: Not on file  Food Insecurity: Not on file  Transportation Needs: Not on file  Physical Activity: Not on file  Stress: Not on file  Social Connections: Not on file  Intimate Partner Violence: Not on file    Family History:   The patient's family history includes Diabetes in his father and mother.    ROS:  Please see the  history of present illness.  All other ROS reviewed and negative.     Physical Exam/Data:   Vitals:   08/31/20 0839 08/31/20 0840 08/31/20 0900 08/31/20 0933  BP: 118/75  132/83   Pulse: 81  72   Resp: 16  15   Temp: 98.2 F (36.8 C)     TempSrc: Oral     SpO2: 99%  100% 98%  Weight:  79.4 kg    Height:  5\' 10"  (1.778 m)     No intake or output data in the 24 hours ending 08/31/20 0945 Last 3 Weights 08/31/2020 03/08/2020  Weight (lbs) 175 lb 175 lb  Weight (kg) 79.379 kg 79.379 kg     Body mass index is 25.11 kg/m.  General:  Well nourished, well developed, in no acute distress HEENT: significant facial and lip edema. Lymph: no adenopathy Neck: no JVD Endocrine:  No thryomegaly Vascular: No carotid bruits; FA pulses 2+ bilaterally without bruits  Cardiac:  normal S1, S2; RRR; no murmur  Lungs:  clear to auscultation bilaterally, no wheezing, rhonchi or rales  Abd: soft, nontender, no hepatomegaly  Ext: no edema Musculoskeletal:  No deformities, BUE and BLE strength normal and equal Skin: warm and dry  Neuro:  CNs 2-12 intact, no focal abnormalities noted Psych:  Normal affect    EKG:  The ECG that was done today was personally reviewed and demonstrates NSR with ST elevation in the lateral leads 1 mm that is new compared to 2020 and reciprocal changes inferiorly. ST elevation in V2 is chronic  Relevant CV Studies: none  Laboratory Data:  High Sensitivity Troponin:  No results for input(s): TROPONINIHS in the last 720 hours.    ChemistryNo results for input(s): NA, K, CL, CO2, GLUCOSE, BUN, CREATININE, CALCIUM, GFRNONAA, GFRAA, ANIONGAP in the last 168 hours.  No results for input(s): PROT, ALBUMIN, AST, ALT, ALKPHOS, BILITOT in the last 168 hours. Hematology Recent Labs  Lab 08/31/20 0859  WBC 7.2  RBC 4.29  HGB 12.1*  HCT 38.0*  MCV 88.6  MCH 28.2  MCHC 31.8  RDW 13.5  PLT 271   BNPNo results for input(s): BNP, PROBNP in the last 168 hours.  DDimer No  results for input(s): DDIMER in the last 168 hours.   Radiology/Studies:  No results found.   Assessment and Plan:   Chest pain with Ecg concerning for acute lateral STEMI. Patient with risk factors of DM poorly controlled, HTN, and probable HLD. This is also in the setting of acute angioedema likely related to ACEi. Patient has received ASA and IV heparin bolus. Given IV solumedrol and Benadryl. While he may  have early repolarization on his Ecg I think the prudent approach would be for him to have emergent cardiac cath to assess coronary anatomy. The procedure and risks were reviewed including but not limited to death, myocardial infarction, stroke, arrythmias, bleeding, transfusion, emergency surgery, dye allergy, or renal dysfunction. The patient voices understanding and is agreeable to proceed. Acute angioedema. I think this is related to recent resumption of  ACEi. Less likely related to shellfish allergy. Will treat with IV steroids and Benadryl and monitor. Needs to avoid ACEi/ARB in future HTN.  DM poorly controlled per patient report. Check A1c. SSI. Has been on metformin at home. May need to escalate care.  HLD. Check lipid panel. High dose statin. Gout.    Risk Assessment/Risk Scores:    TIMI Risk Score for ST  Elevation MI:   The patient's TIMI risk score is 2, which indicates a 2.2% risk of all cause mortality at 30 days.        Severity of Illness: The appropriate patient status for this patient is INPATIENT. Inpatient status is judged to be reasonable and necessary in order to provide the required intensity of service to ensure the patient's safety. The patient's presenting symptoms, physical exam findings, and initial radiographic and laboratory data in the context of their chronic comorbidities is felt to place them at high risk for further clinical deterioration. Furthermore, it is not anticipated that the patient will be medically stable for discharge from the hospital  within 2 midnights of admission. The following factors support the patient status of inpatient.   " The patient's presenting symptoms include chest pain. " The worrisome physical exam findings include angioedema. " The initial radiographic and laboratory data are worrisome because of lateral ST elevation on Ecg. " The chronic co-morbidities include DM, HTN, HLD.   * I certify that at the point of admission it is my clinical judgment that the patient will require inpatient hospital care spanning beyond 2 midnights from the point of admission due to high intensity of service, high risk for further deterioration and high frequency of surveillance required.*   For questions or updates, please contact [facility] Please consult [email or web address] for contact info under     Signed, Vivan Vanderveer [place], MD  08/31/2020 9:45 AM

## 2020-08-31 NOTE — ED Triage Notes (Addendum)
Pt c/o lip swelling that started last night, pt states he takes lisinopril. Pt states some chest pain (tightness), with and without activity that's been  going on for a few days. Denies swelling in throat / tongue and denies SOB.

## 2020-09-01 DIAGNOSIS — N179 Acute kidney failure, unspecified: Secondary | ICD-10-CM

## 2020-09-01 LAB — BASIC METABOLIC PANEL
Anion gap: 9 (ref 5–15)
BUN: 21 mg/dL (ref 8–23)
CO2: 21 mmol/L — ABNORMAL LOW (ref 22–32)
Calcium: 9.1 mg/dL (ref 8.9–10.3)
Chloride: 104 mmol/L (ref 98–111)
Creatinine, Ser: 1.11 mg/dL (ref 0.61–1.24)
GFR, Estimated: >60 mL/min (ref >60)
Glucose, Bld: 201 mg/dL — ABNORMAL HIGH (ref 70–99)
Potassium: 4.5 mmol/L (ref 3.5–5.1)
Sodium: 134 mmol/L — ABNORMAL LOW (ref 135–145)

## 2020-09-01 LAB — MAGNESIUM: Magnesium: 1.7 mg/dL (ref 1.7–2.4)

## 2020-09-01 LAB — HEMOGLOBIN AND HEMATOCRIT, BLOOD
HCT: 33.4 % — ABNORMAL LOW (ref 39.0–52.0)
Hemoglobin: 11.3 g/dL — ABNORMAL LOW (ref 13.0–17.0)

## 2020-09-01 LAB — PHOSPHORUS: Phosphorus: 2.7 mg/dL (ref 2.5–4.6)

## 2020-09-01 LAB — GLUCOSE, CAPILLARY
Glucose-Capillary: 144 mg/dL — ABNORMAL HIGH (ref 70–99)
Glucose-Capillary: 226 mg/dL — ABNORMAL HIGH (ref 70–99)

## 2020-09-01 MED ORDER — METFORMIN HCL 1000 MG PO TABS: 1000.0000 mg | ORAL_TABLET | Freq: Two times a day (BID) | ORAL | 0 refills | Status: DC

## 2020-09-01 MED ORDER — PREDNISONE 20 MG PO TABS: 40.0000 mg | ORAL_TABLET | Freq: Every day | ORAL | 0 refills | Status: AC

## 2020-09-01 MED ORDER — EPINEPHRINE 0.3 MG/0.3ML IJ SOAJ: 0.3000 mg | INTRAMUSCULAR | 0 refills | Status: DC | PRN

## 2020-09-01 MED ORDER — FOLIC ACID 1 MG PO TABS: 1.0000 mg | ORAL_TABLET | Freq: Every day | ORAL | 0 refills | Status: AC

## 2020-09-01 MED ORDER — THIAMINE HCL 100 MG PO TABS: 100.0000 mg | ORAL_TABLET | Freq: Every day | ORAL | 0 refills | Status: AC

## 2020-09-01 MED ORDER — ATORVASTATIN CALCIUM 10 MG PO TABS
10.0000 mg | ORAL_TABLET | ORAL | Status: AC
  Administered 2020-09-01: 10 mg via ORAL
  Filled 2020-09-01: qty 1

## 2020-09-01 MED ORDER — AMLODIPINE BESYLATE 5 MG PO TABS: 5.0000 mg | ORAL_TABLET | Freq: Every day | ORAL | 0 refills | Status: DC

## 2020-09-01 MED ORDER — LORATADINE 10 MG PO TABS: 10.0000 mg | ORAL_TABLET | Freq: Two times a day (BID) | ORAL | 0 refills | Status: DC

## 2020-09-01 MED ORDER — AMLODIPINE BESYLATE 5 MG PO TABS
5.0000 mg | ORAL_TABLET | ORAL | Status: AC
  Administered 2020-09-01: 5 mg via ORAL
  Filled 2020-09-01: qty 1

## 2020-09-01 MED ORDER — FAMOTIDINE 20 MG PO TABS: 20.0000 mg | ORAL_TABLET | Freq: Two times a day (BID) | ORAL | 0 refills | Status: DC

## 2020-09-01 MED ORDER — ATORVASTATIN CALCIUM 10 MG PO TABS: 10.0000 mg | ORAL_TABLET | Freq: Every day | ORAL | 0 refills | Status: DC

## 2020-09-01 MED ORDER — ADULT MULTIVITAMIN W/MINERALS CH: 1.0000 | ORAL_TABLET | Freq: Every day | ORAL | 0 refills | Status: AC

## 2020-09-01 NOTE — Progress Notes (Addendum)
Progress Note  Patient Name: Patrick Hawkins Date of Encounter: 09/01/2020  [facility] Cardiologist: New  Subjective   No complaints  Inpatient Medications    Scheduled Meds:  famotidine  20 mg Oral BID   folic acid  1 mg Oral Daily   insulin aspart  0-9 Units Subcutaneous TID WC   loratadine  20 mg Oral BID   multivitamin with minerals  1 tablet Oral Daily   predniSONE  40 mg Oral Q breakfast   sodium chloride flush  3 mL Intravenous Q12H   thiamine  100 mg Oral Daily   Or   thiamine  100 mg Intravenous Daily   Continuous Infusions:  sodium chloride     sodium chloride 125 mL/hr at 09/01/20 0000   PRN Meds: sodium chloride, acetaminophen, LORazepam **OR** LORazepam, nitroGLYCERIN, ondansetron (ZOFRAN) IV, sodium chloride flush   Vital Signs    Vitals:   08/31/20 1745 08/31/20 2046 08/31/20 2100 09/01/20 0214  BP: (!) 142/85 131/81 128/83 (!) 144/91  Pulse: 74 83 74 79  Resp: 20  19 19   Temp:   97.9 F (36.6 C) 97.8 F (36.6 C)  TempSrc:   Oral Oral  SpO2: 100%  100% 99%  Weight:      Height:        Intake/Output Summary (Last 24 hours) at 09/01/2020 0731 Last data filed at 09/01/2020 0000 Gross per 24 hour  Intake 1441.27 ml  Output 800 ml  Net 641.27 ml   Last 3 Weights 08/31/2020 03/08/2020  Weight (lbs) 175 lb 175 lb  Weight (kg) 79.379 kg 79.379 kg      Telemetry    NSR - Personally Reviewed  ECG    N/a - Personally Reviewed  Physical Exam   GEN: No acute distress.   Neck: No JVD Cardiac: RRR, no murmurs, rubs, or gallops.  Respiratory: Clear to auscultation bilaterally. GI: Soft, nontender, non-distended  MS: No edema; No deformity. Neuro:  Nonfocal  Psych: Normal affect   Labs    High Sensitivity Troponin:   Recent Labs  Lab 08/31/20 0859 08/31/20 1124  TROPONINIHS 6 5      Chemistry Recent Labs  Lab 08/31/20 0859 09/01/20 0427  NA 134* 134*  K 4.2 4.5  CL 100 104  CO2 21* 21*  GLUCOSE 182* 201*  BUN 28* 21   CREATININE 2.02* 1.11  CALCIUM 9.7 9.1  PROT 7.0  --   ALBUMIN 3.3*  --   AST 18  --   ALT 21  --   ALKPHOS 77  --   BILITOT 0.4  --   GFRNONAA 37* >60  ANIONGAP 13 9     Hematology Recent Labs  Lab 08/31/20 0859 09/01/20 0427  WBC 7.2  --   RBC 4.29  --   HGB 12.1* 11.3*  HCT 38.0* 33.4*  MCV 88.6  --   MCH 28.2  --   MCHC 31.8  --   RDW 13.5  --   PLT 271  --     BNPNo results for input(s): BNP, PROBNP in the last 168 hours.   DDimer No results for input(s): DDIMER in the last 168 hours.   Radiology    CARDIAC CATHETERIZATION  Result Date: 08/31/2020 Formatting of this result is different from the original.   The left ventricular systolic function is normal.   LV end diastolic pressure is normal.   The left ventricular ejection fraction is 55-65% by visual estimate. 1.  Patent coronary  arteries with no significant obstructive disease.  Mild irregularity of the proximal and mid LAD with no significant stenosis.  Angiographically normal left main, left circumflex, ramus intermedius, and RCA. 2.  Normal LV systolic function with no regional wall motion abnormalities and LVEF estimated at 55 to 60%, normal LVEDP Recommend: Overnight observation, ongoing treatment of angioedema, fortunately the patient has no significant CAD or active cardiac disease noted on this study.    Cardiac Studies    Patient Profile     Patrick Hawkins is a 60 y.o. male with history of HTN, DM  who is being seen 08/31/2020 for the evaluation of chest tightness and abnormal Ecg.  Assessment & Plan    Chest pain -EKG concerning for lateral STEMi on presentation - cath showed patent coronaries.  - no further workup indicated     2. Angioedema - likely secondary to ACE-I - management per primary team - swelling is improving, no respiraotry issuess.   3. AKI -resolved with IVFs  Will follow up medicine recs today regarding ongoing therapy for angioedema and timing of discharge.   Spoke  with Dr Margo Aye hospitalist service who will discharge patient with her final recs as far as medical therapy, no additional cardiology recs at this time.   For questions or updates, please contact [facility] Please consult [email or web address] for contact info under        Signed, Dina Rich, MD  09/01/2020, 7:31 AM

## 2020-09-01 NOTE — Progress Notes (Signed)
Inpatient Diabetes Program Recommendations  AACE/ADA: New Consensus Statement on Inpatient Glycemic Control   Target Ranges:  Prepandial:   less than 140 mg/dL      Peak postprandial:   less than 180 mg/dL (1-2 hours)      Critically ill patients:  140 - 180 mg/dL   Results for Patrick Hawkins, Patrick Hawkins (MRN [id number]) as of 09/01/2020 10:24  Ref. Range 08/31/2020 11:57 08/31/2020 17:22 08/31/2020 21:55 09/01/2020 07:28  Glucose-Capillary Latest Ref Range: 70 - 99 mg/dL 034 (H) 917 (H) 915 (H) 144 (H)    Review of Glycemic Control  Diabetes history: DM2 Outpatient Diabetes medications: Metformin 1000 mg BID Current orders for Inpatient glycemic control: Novolog 0-9 units TID with meals; Prednisone 40 mg QAM  Inpatient Diabetes Program Recommendations:    Insulin: If steroids are continued, please consider ordering Novolog 3 units TID with meals for meal coverage if patient eats at least 50% of meals.  Thanks, Orlando Penner, RN, MSN, CDE Diabetes Coordinator Inpatient Diabetes Program [phone] (Team Pager from 8am to 5pm)

## 2020-09-01 NOTE — Discharge Summary (Signed)
Discharge Summary  Patrick Hawkins WER:[id number] DOB: December 27, 1960  PCP: Pcp, No  Admit date: 08/31/2020 Discharge date: 09/01/2020  Time spent: 35 minutes  Recommendations for Outpatient Follow-up:  Follow-up with cardiology Follow-up with your primary care provider  Discharge Diagnoses:  Active Hospital Problems   Diagnosis Date Noted   Chest pain at rest 08/31/2020   Angioedema 08/31/2020   AKI (acute kidney injury) [facility]) 08/31/2020    Resolved Hospital Problems  No resolved problems to display.    Discharge Condition: Stable  Diet recommendation: Dash diet  Vitals:   09/01/20 0214 09/01/20 1107  BP: (!) 144/91 (!) 150/97  Pulse: 79 94  Resp: 19 18  Temp: 97.8 F (36.6 C) 97.8 F (36.6 C)  SpO2: 99% 99%    History of present illness:    Patient Profile:    Patrick Hawkins is a 60 y.o. male with history of HTN, DM  who is being seen 08/31/2020 for the evaluation of chest tightness and abnormal Ecg.   History of Present Illness:    Mr. Laviolette has no prior cardiac history. He has a history of DM and HTN. Lipid status is unknown. Reports 3 day history of intermittent chest tightness. Last night developed facial swelling. Thought this was related to shrimp allergy as he ate out last night and thinks his food may have been cross contaminated with shrimp. He also notes he had been on lisinopril in the past. Quit taking a long time ago but recently found a bottle and so started back on it 2 days ago. Currently having active left precordial chest tightness. No SOB or radiation. No diaphoresis.   09/01/20: Seen and examined at his bedside.  He has no new complaints.    Hospital Course:  Active Problems:   Chest pain at rest   Angioedema   AKI (acute kidney injury) (HCC)  Chest pain with Ecg concerning for acute lateral STEMI. Patient with risk factors of DM poorly controlled, HTN, and probable HLD. This is also in the setting of acute angioedema likely related to ACEi.  Patient has received ASA and IV heparin bolus. Given IV solumedrol and Benadryl. While he may have early repolarization on his Ecg I think the prudent approach would be for him to have emergent cardiac cath to assess coronary anatomy. The procedure and risks were reviewed including but not limited to death, myocardial infarction, stroke, arrythmias, bleeding, transfusion, emergency surgery, dye allergy, or renal dysfunction. The patient voices understanding and is agreeable to proceed. Acute angioedema. I think this is related to recent resumption of  ACEi. Less likely related to shellfish allergy. Will treat with IV steroids and Benadryl and monitor. Needs to avoid ACEi/ARB in future. HTN.  Lisinopril discontinued.  Avoid losartan or other ARB.  Norvasc added 5 mg daily.  Follow-up with your PCP. DM2 poorly controlled per patient report. Check A1c.  Hemoglobin A1c 8.1 on 08/31/2020.  Has been on metformin at home.  HLD. Check lipid panel.  LDL 95, goal less than 70.  Lipitor 10 mg daily, follow-up with PCP and cardiology. Gout.  Stable.      Procedures: Heart cath on 08/31/2020, showed patent coronary arteries.  Consultations: Cardiology  Discharge Exam: BP (!) 150/97 (BP Location: Left Arm)   Pulse 94   Temp 97.8 F (36.6 C) (Oral)   Resp 18   Ht 5\' 10"  (1.778 m)   Wt 79.4 kg   SpO2 99%   BMI 25.11 kg/m  General: 60 y.o. year-old male  well developed well nourished in no acute distress.  Alert and oriented x3. Cardiovascular: Regular rate and rhythm with no rubs or gallops.  No thyromegaly or JVD noted.   Respiratory: Clear to auscultation with no wheezes or rales. Good inspiratory effort. Abdomen: Soft nontender nondistended with normal bowel sounds x4 quadrants. Musculoskeletal: No lower extremity edema. 2/4 pulses in all 4 extremities. Skin: No ulcerative lesions noted or rashes, Psychiatry: Mood is appropriate for condition and setting  Discharge Instructions You were cared for  by a hospitalist during your hospital stay. If you have any questions about your discharge medications or the care you received while you were in the hospital after you are discharged, you can call the unit and asked to speak with the hospitalist on call if the hospitalist that took care of you is not available. Once you are discharged, your primary care physician will handle any further medical issues. Please note that NO REFILLS for any discharge medications will be authorized once you are discharged, as it is imperative that you return to your primary care physician (or establish a relationship with a primary care physician if you do not have one) for your aftercare needs so that they can reassess your need for medications and monitor your lab values.   Allergies as of 09/01/2020       Reactions   Ace Inhibitors Swelling   Angioedema 08/31/2020   Cat Hair Extract Shortness Of Breath   Losartan Swelling   Shellfish Allergy Anaphylaxis, Swelling   Other reaction(s): Angioedema        Medication List     STOP taking these medications    lisinopril 20 MG tablet Commonly known as: ZESTRIL       TAKE these medications    albuterol 108 (90 Base) MCG/ACT inhaler Commonly known as: VENTOLIN HFA Inhale 2 puffs into the lungs every 6 (six) hours as needed for wheezing or shortness of breath.   amLODipine 5 MG tablet Commonly known as: NORVASC Take 1 tablet (5 mg total) by mouth daily for 180 doses.   atorvastatin 10 MG tablet Commonly known as: LIPITOR Take 1 tablet (10 mg total) by mouth daily.   EPINEPHrine 0.3 mg/0.3 mL Soaj injection Commonly known as: EPI-PEN Inject 0.3 mg into the muscle as needed for anaphylaxis.   famotidine 20 MG tablet Commonly known as: PEPCID Take 1 tablet (20 mg total) by mouth 2 (two) times daily for 3 days.   folic acid 1 MG tablet Commonly known as: FOLVITE Take 1 tablet (1 mg total) by mouth daily. Start taking on: September 02, 2020    loratadine 10 MG tablet Commonly known as: CLARITIN Take 1 tablet (10 mg total) by mouth 2 (two) times daily for 3 days.   metFORMIN 1000 MG tablet Commonly known as: GLUCOPHAGE Take 1 tablet (1,000 mg total) by mouth 2 (two) times daily with a meal.   multivitamin with minerals Tabs tablet Take 1 tablet by mouth daily. Start taking on: September 02, 2020   predniSONE 20 MG tablet Commonly known as: DELTASONE Take 2 tablets (40 mg total) by mouth daily with breakfast for 3 days. Start taking on: September 02, 2020   sildenafil 100 MG tablet Commonly known as: Viagra Take 1 tablet (100 mg total) by mouth daily as needed for erectile dysfunction.   thiamine 100 MG tablet Take 1 tablet (100 mg total) by mouth daily. Start taking on: September 02, 2020       Allergies  Allergen  Reactions   Ace Inhibitors Swelling    Angioedema 08/31/2020   Cat Hair Extract Shortness Of Breath   Losartan Swelling   Shellfish Allergy Anaphylaxis and Swelling    Other reaction(s): Angioedema    Follow-up Information     [place], Peter M, MD. Call today.   Specialty: Cardiology Why: Please call for a posthospital follow-up appointment. Contact information: [address] [place] [address] [phone]         Chilton Si, MD. Call in 1 day(s).   Specialty: Cardiology Why: Please call Dr. Leonides Sake office for referral to hypertension clinic. Contact information: [address] [place] [address] [place] [address] [phone]                  The results of significant diagnostics from this hospitalization (including imaging, microbiology, ancillary and laboratory) are listed below for reference.    Significant Diagnostic Studies: CARDIAC CATHETERIZATION  Result Date: 08/31/2020 Formatting of this result is different from the original.   The left ventricular systolic function is normal.   LV end diastolic pressure is normal.   The left ventricular ejection fraction  is 55-65% by visual estimate. 1.  Patent coronary arteries with no significant obstructive disease.  Mild irregularity of the proximal and mid LAD with no significant stenosis.  Angiographically normal left main, left circumflex, ramus intermedius, and RCA. 2.  Normal LV systolic function with no regional wall motion abnormalities and LVEF estimated at 55 to 60%, normal LVEDP Recommend: Overnight observation, ongoing treatment of angioedema, fortunately the patient has no significant CAD or active cardiac disease noted on this study.    Microbiology: Recent Results (from the past 240 hour(s))  Resp Panel by RT-PCR (Flu A&B, Covid) Nasopharyngeal Swab     Status: None   Collection Time: 08/31/20  9:02 AM   Specimen: Nasopharyngeal Swab; Nasopharyngeal(NP) swabs in vial transport medium  Result Value Ref Range Status   SARS Coronavirus 2 by RT PCR NEGATIVE NEGATIVE Final    Comment: (NOTE) SARS-CoV-2 target nucleic acids are NOT DETECTED.  The SARS-CoV-2 RNA is generally detectable in upper respiratory specimens during the acute phase of infection. The lowest concentration of SARS-CoV-2 viral copies this assay can detect is 138 copies/mL. A negative result does not preclude SARS-Cov-2 infection and should not be used as the sole basis for treatment or other patient management decisions. A negative result may occur with  improper specimen collection/handling, submission of specimen other than nasopharyngeal swab, presence of viral mutation(s) within the areas targeted by this assay, and inadequate number of viral copies(<138 copies/mL). A negative result must be combined with clinical observations, patient history, and epidemiological information. The expected result is Negative.  Fact Sheet for Patients:  BloggerCourse.com  Fact Sheet for Healthcare Providers:  SeriousBroker.it  This test is no t yet approved or cleared by the [place] FDA and  has been authorized for detection and/or diagnosis of SARS-CoV-2 by FDA under an Emergency Use Authorization (EUA). This EUA will remain  in effect (meaning this test can be used) for the duration of the COVID-19 declaration under Section 564(b)(1) of the Act, 21 U.S.C.section 360bbb-3(b)(1), unless the authorization is terminated  or revoked sooner.       Influenza A by PCR NEGATIVE NEGATIVE Final   Influenza B by PCR NEGATIVE NEGATIVE Final    Comment: (NOTE) The Xpert Xpress SARS-CoV-2/FLU/RSV plus assay is intended as an aid in the diagnosis of influenza from Nasopharyngeal swab specimens and should  not be used as a sole basis for treatment. Nasal washings and aspirates are unacceptable for Xpert Xpress SARS-CoV-2/FLU/RSV testing.  Fact Sheet for Patients: BloggerCourse.com  Fact Sheet for Healthcare Providers: SeriousBroker.it  This test is not yet approved or cleared by the [place] FDA and has been authorized for detection and/or diagnosis of SARS-CoV-2 by FDA under an Emergency Use Authorization (EUA). This EUA will remain in effect (meaning this test can be used) for the duration of the COVID-19 declaration under Section 564(b)(1) of the Act, 21 U.S.C. section 360bbb-3(b)(1), unless the authorization is terminated or revoked.  Performed at [facility], [address] [place], [place] [address]      Labs: Basic Metabolic Panel: Recent Labs  Lab 08/31/20 0859 09/01/20 0427  NA 134* 134*  K 4.2 4.5  CL 100 104  CO2 21* 21*  GLUCOSE 182* 201*  BUN 28* 21  CREATININE 2.02* 1.11  CALCIUM 9.7 9.1  MG  --  1.7  PHOS  --  2.7   Liver Function Tests: Recent Labs  Lab 08/31/20 0859  AST 18  ALT 21  ALKPHOS 77  BILITOT 0.4  PROT 7.0  ALBUMIN 3.3*   No results for input(s): LIPASE, AMYLASE in the last 168 hours. No results for input(s): AMMONIA in the last 168  hours. CBC: Recent Labs  Lab 08/31/20 0859 09/01/20 0427  WBC 7.2  --   NEUTROABS 4.5  --   HGB 12.1* 11.3*  HCT 38.0* 33.4*  MCV 88.6  --   PLT 271  --    Cardiac Enzymes: No results for input(s): CKTOTAL, CKMB, CKMBINDEX, TROPONINI in the last 168 hours. BNP: BNP (last 3 results) No results for input(s): BNP in the last 8760 hours.  ProBNP (last 3 results) No results for input(s): PROBNP in the last 8760 hours.  CBG: Recent Labs  Lab 08/31/20 1157 08/31/20 1722 08/31/20 2155 09/01/20 0728 09/01/20 1113  GLUCAP 180* 320* 248* 144* 226*       Signed:  Darlin Drop, MD [facility] 09/01/2020, 6:50 PM

## 2020-09-03 MED FILL — Heparin Sodium (Porcine) Inj 1000 Unit/ML: INTRAMUSCULAR | Qty: 10 | Status: AC

## 2020-09-03 MED FILL — Nitroglycerin IV Soln 100 MCG/ML in D5W: INTRA_ARTERIAL | Qty: 10 | Status: AC

## 2020-09-15 ENCOUNTER — Ambulatory Visit (HOSPITAL_COMMUNITY)
Admission: EM | Admit: 2020-09-15 | Discharge: 2020-09-15 | Disposition: A | Discharge status: Home / Self Care | Payer: Self-pay | Attending: Emergency Medicine | Admitting: Emergency Medicine

## 2020-09-15 ENCOUNTER — Other Ambulatory Visit: Payer: Self-pay

## 2020-09-15 ENCOUNTER — Encounter (HOSPITAL_COMMUNITY): Payer: Self-pay

## 2020-09-15 DIAGNOSIS — M109 Gout, unspecified: Secondary | ICD-10-CM

## 2020-09-15 MED ORDER — PREDNISONE 20 MG PO TABS: 20.0000 mg | ORAL_TABLET | Freq: Every day | ORAL | 0 refills | Status: AC

## 2020-09-15 MED ORDER — METHYLPREDNISOLONE SODIUM SUCC 125 MG IJ SOLR
60.0000 mg | Freq: Once | INTRAMUSCULAR | Status: AC
  Administered 2020-09-15: 60 mg via INTRAMUSCULAR

## 2020-09-15 MED ORDER — METHYLPREDNISOLONE SODIUM SUCC 125 MG IJ SOLR
INTRAMUSCULAR | Status: AC
  Filled 2020-09-15: qty 2

## 2020-09-15 NOTE — Discharge Instructions (Addendum)
Take the prednisone daily for the next 3 days.    Return or go to the Emergency Department if symptoms worsen or do not improve in the next few days.

## 2020-09-15 NOTE — ED Provider Notes (Signed)
[facility]    CSN: [id number] Arrival date & time: 09/15/20  1017      History   Chief Complaint Chief Complaint  Patient presents with   Gout    HPI Patrick Hawkins is a 60 y.o. male.   Patient here for evaluation of right knee pain and swelling similar to gout flares in the past.  Reports gout flares associated with poor diet and reports not eating well recently.  Reports pain and swelling has been ongoing for the past 3 weeks.  Reports taking ibuprofen with minimal relief.  Reports using steroids to help relieve gout flares in the past.  Denies any trauma, injury, or other precipitating event.  Denies any fevers, chest pain, shortness of breath, N/V/D, numbness, tingling, weakness, abdominal pain, or headaches.     The history is provided by the patient.   Past Medical History:  Diagnosis Date   Acid reflux    Asthma    Diabetes mellitus without complication (HCC)    Gout    High cholesterol    Hypertension     Patient Active Problem List   Diagnosis Date Noted   Chest pain at rest 08/31/2020   Angioedema 08/31/2020   AKI (acute kidney injury) (HCC) 08/31/2020    Past Surgical History:  Procedure Laterality Date   LEFT HEART CATH AND CORONARY ANGIOGRAPHY N/A 08/31/2020   Procedure: LEFT HEART CATH AND CORONARY ANGIOGRAPHY;  Surgeon: Tonny Bollman, MD;  Location: [facility] INVASIVE CV LAB;  Service: Cardiovascular;  Laterality: N/A;       Home Medications    Prior to Admission medications   Medication Sig Start Date End Date Taking? Authorizing Provider  predniSONE (DELTASONE) 20 MG tablet Take 1 tablet (20 mg total) by mouth daily for 3 days. 09/15/20 09/18/20 Yes Ivette Loyal, NP  albuterol (VENTOLIN HFA) 108 (90 Base) MCG/ACT inhaler Inhale 2 puffs into the lungs every 6 (six) hours as needed for wheezing or shortness of breath. 08/31/20   Madelyn Flavors A, MD  amLODipine (NORVASC) 5 MG tablet Take 1 tablet (5 mg total) by mouth daily for 180 doses.  09/01/20 02/28/21  Darlin Drop, DO  atorvastatin (LIPITOR) 10 MG tablet Take 1 tablet (10 mg total) by mouth daily. 09/01/20 02/28/21  Darlin Drop, DO  EPINEPHrine 0.3 mg/0.3 mL IJ SOAJ injection Inject 0.3 mg into the muscle as needed for anaphylaxis. 09/01/20   Darlin Drop, DO  famotidine (PEPCID) 20 MG tablet Take 1 tablet (20 mg total) by mouth 2 (two) times daily for 3 days. 09/01/20 09/04/20  Darlin Drop, DO  folic acid (FOLVITE) 1 MG tablet Take 1 tablet (1 mg total) by mouth daily. 09/02/20 12/01/20  Darlin Drop, DO  loratadine (CLARITIN) 10 MG tablet Take 1 tablet (10 mg total) by mouth 2 (two) times daily for 3 days. 09/01/20 09/04/20  Darlin Drop, DO  metFORMIN (GLUCOPHAGE) 1000 MG tablet Take 1 tablet (1,000 mg total) by mouth 2 (two) times daily with a meal. 09/01/20 02/28/21  Darlin Drop, DO  Multiple Vitamin (MULTIVITAMIN WITH MINERALS) TABS tablet Take 1 tablet by mouth daily. 09/02/20 12/01/20  Darlin Drop, DO  sildenafil (VIAGRA) 100 MG tablet Take 1 tablet (100 mg total) by mouth daily as needed for erectile dysfunction. 03/08/20   LampteyBritta Mccreedy, MD  thiamine 100 MG tablet Take 1 tablet (100 mg total) by mouth daily. 09/02/20 12/01/20  Darlin Drop, DO  tadalafil (CIALIS) 10  MG tablet Take 10 mg by mouth daily as needed for erectile dysfunction.  03/08/20  [provider]  traZODone (DESYREL) 50 MG tablet Take 1 tablet (50 mg total) by mouth at bedtime. 05/30/19 03/08/20  LampteyBritta Mccreedy, MD    Family History Family History  Problem Relation Age of Onset   Diabetes Mother    Diabetes Father     Social History Social History   Tobacco Use   Smoking status: Some Days    Types: Cigars   Smokeless tobacco: Never  Substance Use Topics   Alcohol use: Yes    Alcohol/week: 36.0 standard drinks    Types: 36 Cans of beer per week    Comment: 6 bottles/night   Drug use: Never     Allergies   Ace inhibitors, Cat hair extract, Losartan, and Shellfish  allergy   Review of Systems Review of Systems  Musculoskeletal:  Positive for arthralgias and joint swelling.  All other systems reviewed and are negative.   Physical Exam Triage Vital Signs ED Triage Vitals  Enc Vitals Group     BP 09/15/20 1152 (!) 159/79     Pulse Rate 09/15/20 1152 73     Resp 09/15/20 1152 20     Temp 09/15/20 1152 98.6 F (37 C)     Temp Source 09/15/20 1152 Oral     SpO2 09/15/20 1152 100 %     Weight --      Height --      Head Circumference --      Peak Flow --      Pain Score 09/15/20 1153 9     Pain Loc --      Pain Edu? --      Excl. in GC? --    No data found.  Updated Vital Signs BP (!) 159/79 (BP Location: Right Arm)   Pulse 73   Temp 98.6 F (37 C) (Oral)   Resp 20   SpO2 100%   Visual Acuity Right Eye Distance:   Left Eye Distance:   Bilateral Distance:    Right Eye Near:   Left Eye Near:    Bilateral Near:     Physical Exam Vitals and nursing note reviewed.  Constitutional:      General: He is not in acute distress.    Appearance: Normal appearance. He is not ill-appearing, toxic-appearing or diaphoretic.  HENT:     Head: Normocephalic and atraumatic.  Eyes:     Conjunctiva/sclera: Conjunctivae normal.  Cardiovascular:     Rate and Rhythm: Normal rate.     Pulses: Normal pulses.  Pulmonary:     Effort: Pulmonary effort is normal.  Abdominal:     General: Abdomen is flat.  Musculoskeletal:     Cervical back: Normal range of motion.     Right knee: Swelling and erythema present. No deformity, effusion, bony tenderness or crepitus. Decreased range of motion (due to pain and swelling). Tenderness present. No medial joint line tenderness. No LCL laxity, MCL laxity, ACL laxity or PCL laxity. Normal alignment, normal meniscus and normal patellar mobility. Normal pulse.     Left knee: Normal.     Right lower leg: Normal.     Left lower leg: Normal.  Skin:    General: Skin is warm and dry.  Neurological:     General:  No focal deficit present.     Mental Status: He is alert and oriented to person, place, and time.  Psychiatric:  Mood and Affect: Mood normal.     UC Treatments / Results  Labs (all labs ordered are listed, but only abnormal results are displayed) Labs Reviewed - No data to display  EKG   Radiology No results found.  Procedures Procedures (including critical care time)  Medications Ordered in UC Medications  methylPREDNISolone sodium succinate (SOLU-MEDROL) 125 mg/2 mL injection 60 mg (60 mg Intramuscular Given 09/15/20 1228)    Initial Impression / Assessment and Plan / UC Course  I have reviewed the triage vital signs and the nursing notes.  Pertinent labs & imaging results that were available during my care of the patient were reviewed by me and considered in my medical decision making (see chart for details).    Assessment negative for red flags or concerns. Likely acute gout flare.  Will treat with solu-medrol IM in office and prednisone daily for the next 3 days.  Follow up for any worsening symptoms or lack of improvement.   Final Clinical Impressions(s) / UC Diagnoses   Final diagnoses:  Acute gout of multiple sites, unspecified cause     Discharge Instructions      Take the prednisone daily for the next 3 days.    Return or go to the Emergency Department if symptoms worsen or do not improve in the next few days.      ED Prescriptions     Medication Sig Dispense Auth. Provider   predniSONE (DELTASONE) 20 MG tablet Take 1 tablet (20 mg total) by mouth daily for 3 days. 3 tablet Ivette Loyal, NP      PDMP not reviewed this encounter.   Ivette Loyal, NP 09/15/20 1235

## 2020-09-15 NOTE — ED Triage Notes (Signed)
Pt presents with gout flare up X 3 weeks.

## 2020-10-23 ENCOUNTER — Encounter (HOSPITAL_COMMUNITY): Payer: Self-pay | Admitting: Emergency Medicine

## 2020-10-23 ENCOUNTER — Ambulatory Visit (HOSPITAL_COMMUNITY)
Admission: EM | Admit: 2020-10-23 | Discharge: 2020-10-23 | Disposition: A | Discharge status: Home / Self Care | Payer: Self-pay | Attending: Emergency Medicine | Admitting: Emergency Medicine

## 2020-10-23 ENCOUNTER — Other Ambulatory Visit: Payer: Self-pay

## 2020-10-23 DIAGNOSIS — E1165 Type 2 diabetes mellitus with hyperglycemia: Secondary | ICD-10-CM

## 2020-10-23 DIAGNOSIS — Z7984 Long term (current) use of oral hypoglycemic drugs: Secondary | ICD-10-CM

## 2020-10-23 DIAGNOSIS — B9689 Other specified bacterial agents as the cause of diseases classified elsewhere: Secondary | ICD-10-CM

## 2020-10-23 DIAGNOSIS — E119 Type 2 diabetes mellitus without complications: Secondary | ICD-10-CM | POA: Insufficient documentation

## 2020-10-23 DIAGNOSIS — J019 Acute sinusitis, unspecified: Secondary | ICD-10-CM

## 2020-10-23 DIAGNOSIS — J302 Other seasonal allergic rhinitis: Secondary | ICD-10-CM

## 2020-10-23 LAB — HEMOGLOBIN A1C
Hgb A1c MFr Bld: 8.1 % — ABNORMAL HIGH (ref 4.8–5.6)
Mean Plasma Glucose: 185.77 mg/dL

## 2020-10-23 MED ORDER — AMOXICILLIN-POT CLAVULANATE 875-125 MG PO TABS: 1.0000 | ORAL_TABLET | Freq: Two times a day (BID) | ORAL | 0 refills | Status: DC

## 2020-10-23 MED ORDER — GUAIFENESIN ER 600 MG PO TB12: 600.0000 mg | ORAL_TABLET | Freq: Two times a day (BID) | ORAL | 0 refills | Status: DC

## 2020-10-23 MED ORDER — LORATADINE 10 MG PO TABS: 10.0000 mg | ORAL_TABLET | Freq: Every day | ORAL | 1 refills | Status: DC

## 2020-10-23 NOTE — ED Triage Notes (Signed)
Pt presents with productive cough, nasal congestion and sore throat xs 10 days. States has taken multiple OTC medications with little relief.

## 2020-10-23 NOTE — Discharge Instructions (Addendum)
Your symptoms today are likely being caused by a virus or an allergy flareup You are being given an antibiotic today only due to the length of your symptoms for possible bacterial sinusitis this may or may not be effective  Take Augmentin twice a day for 7 days  Use Mucinex twice a day as needed for congestion  Begin taking Claritin daily at bedtime, this medication is a antihistamine and is the treatment for allergies  Your hemoglobin A1c is pending, you will be notified of any concerning value, if level is elevated you will need to establish care with a primary doctor for management of your diabetes, until seen by primary care please adjust your diet, information has been placed in your packet about appropriate nutrition, care referral has been placed.  Someone will reach out to help you establish care.  Information for the community health and wellness center as well as the [facility] department has been placed in your packet, you may call to see if you can get an appointment  You may follow-up at urgent care for persistent symptoms that do not seem to be improving or resolving as needed

## 2020-10-25 NOTE — ED Provider Notes (Signed)
[facility]    CSN: [id number] Arrival date & time: 10/23/20  9937      History   Chief Complaint Chief Complaint  Patient presents with  . Cough  . Nasal Congestion  . Sore Throat    HPI Patrick Hawkins is a 60 y.o. male.   Patient presents with nonproductive cough, nasal congestion and sore throat for 10 days.  Accompanying fever which now has resolved and sore throat is not as prominent as it was.  Has been taking over-the-counter medications which have not improved symptoms.  Denies chills, body aches, abdominal pain, nausea, vomiting, diarrhea, shortness of breath, wheezing.  Requesting antibiotic.  History of asthma  Patient requesting A1c level to be checked.  Says his CBGs at home have been running greater than 200.  Type 2 diabetes, taking metformin as prescribed.  Does not currently have a PCP.  Past Medical History:  Diagnosis Date  . Acid reflux   . Asthma   . Diabetes mellitus without complication (HCC)   . Gout   . High cholesterol   . Hypertension     Patient Active Problem List   Diagnosis Date Noted  . Chest pain at rest 08/31/2020  . Angioedema 08/31/2020  . AKI (acute kidney injury) (HCC) 08/31/2020    Past Surgical History:  Procedure Laterality Date  . LEFT HEART CATH AND CORONARY ANGIOGRAPHY N/A 08/31/2020   Procedure: LEFT HEART CATH AND CORONARY ANGIOGRAPHY;  Surgeon: Tonny Bollman, MD;  Location: [facility] INVASIVE CV LAB;  Service: Cardiovascular;  Laterality: N/A;       Home Medications    Prior to Admission medications   Medication Sig Start Date End Date Taking? Authorizing Provider  amoxicillin-clavulanate (AUGMENTIN) 875-125 MG tablet Take 1 tablet by mouth every 12 (twelve) hours. 10/23/20  Yes Dolora Ridgely R, NP  guaiFENesin (MUCINEX) 600 MG 12 hr tablet Take 1 tablet (600 mg total) by mouth 2 (two) times daily. 10/23/20  Yes Shea Kapur R, NP  loratadine (CLARITIN) 10 MG tablet Take 1 tablet (10 mg total) by mouth  daily. 10/23/20  Yes Salahuddin Arismendez, Elita Boone, NP  albuterol (VENTOLIN HFA) 108 (90 Base) MCG/ACT inhaler Inhale 2 puffs into the lungs every 6 (six) hours as needed for wheezing or shortness of breath. 08/31/20   Madelyn Flavors A, MD  amLODipine (NORVASC) 5 MG tablet Take 1 tablet (5 mg total) by mouth daily for 180 doses. 09/01/20 02/28/21  Darlin Drop, DO  atorvastatin (LIPITOR) 10 MG tablet Take 1 tablet (10 mg total) by mouth daily. 09/01/20 02/28/21  Darlin Drop, DO  EPINEPHrine 0.3 mg/0.3 mL IJ SOAJ injection Inject 0.3 mg into the muscle as needed for anaphylaxis. 09/01/20   Darlin Drop, DO  famotidine (PEPCID) 20 MG tablet Take 1 tablet (20 mg total) by mouth 2 (two) times daily for 3 days. 09/01/20 09/04/20  Darlin Drop, DO  folic acid (FOLVITE) 1 MG tablet Take 1 tablet (1 mg total) by mouth daily. 09/02/20 12/01/20  Darlin Drop, DO  metFORMIN (GLUCOPHAGE) 1000 MG tablet Take 1 tablet (1,000 mg total) by mouth 2 (two) times daily with a meal. 09/01/20 02/28/21  Darlin Drop, DO  Multiple Vitamin (MULTIVITAMIN WITH MINERALS) TABS tablet Take 1 tablet by mouth daily. 09/02/20 12/01/20  Darlin Drop, DO  sildenafil (VIAGRA) 100 MG tablet Take 1 tablet (100 mg total) by mouth daily as needed for erectile dysfunction. 03/08/20   Lamptey, Britta Mccreedy, MD  thiamine 100  MG tablet Take 1 tablet (100 mg total) by mouth daily. 09/02/20 12/01/20  Darlin Drop, DO  tadalafil (CIALIS) 10 MG tablet Take 10 mg by mouth daily as needed for erectile dysfunction.  03/08/20  [provider]  traZODone (DESYREL) 50 MG tablet Take 1 tablet (50 mg total) by mouth at bedtime. 05/30/19 03/08/20  Merrilee Jansky, MD    Family History Family History  Problem Relation Age of Onset  . Diabetes Mother   . Diabetes Father     Social History Social History   Tobacco Use  . Smoking status: Some Days    Types: Cigars  . Smokeless tobacco: Never  Substance Use Topics  . Alcohol use: Yes    Alcohol/week:  36.0 standard drinks    Types: 36 Cans of beer per week    Comment: 6 bottles/night  . Drug use: Never     Allergies   Ace inhibitors, Cat hair extract, Losartan, and Shellfish allergy   Review of Systems Review of Systems Defer to HPI    Physical Exam Triage Vital Signs ED Triage Vitals  Enc Vitals Group     BP 10/23/20 1030 (!) 164/100     Pulse Rate 10/23/20 1030 78     Resp 10/23/20 1030 17     Temp 10/23/20 1030 98.2 F (36.8 C)     Temp Source 10/23/20 1030 Oral     SpO2 10/23/20 1030 98 %     Weight --      Height --      Head Circumference --      Peak Flow --      Pain Score 10/23/20 1029 0     Pain Loc --      Pain Edu? --      Excl. in GC? --    No data found.  Updated Vital Signs BP (!) 164/100 (BP Location: Right Arm)   Pulse 78   Temp 98.2 F (36.8 C) (Oral)   Resp 17   SpO2 98%   Visual Acuity Right Eye Distance:   Left Eye Distance:   Bilateral Distance:    Right Eye Near:   Left Eye Near:    Bilateral Near:     Physical Exam Constitutional:      Appearance: Normal appearance. He is normal weight.  HENT:     Head: Normocephalic.     Right Ear: Tympanic membrane, ear canal and external ear normal.     Left Ear: Tympanic membrane and external ear normal.     Nose: Congestion and rhinorrhea present.     Mouth/Throat:     Mouth: Mucous membranes are moist.     Pharynx: Oropharynx is clear.  Eyes:     Extraocular Movements: Extraocular movements intact.  Cardiovascular:     Rate and Rhythm: Normal rate and regular rhythm.     Pulses: Normal pulses.     Heart sounds: Normal heart sounds.  Pulmonary:     Effort: Pulmonary effort is normal.     Breath sounds: Normal breath sounds.  Musculoskeletal:     Cervical back: Normal range of motion and neck supple.  Skin:    General: Skin is warm and dry.  Neurological:     Mental Status: He is alert and oriented to person, place, and time. Mental status is at baseline.  Psychiatric:         Mood and Affect: Mood normal.        Behavior: Behavior normal.  UC Treatments / Results  Labs (all labs ordered are listed, but only abnormal results are displayed) Labs Reviewed  HEMOGLOBIN A1C - Abnormal; Notable for the following components:      Result Value   Hgb A1c MFr Bld 8.1 (*)    All other components within normal limits    EKG   Radiology No results found.  Procedures Procedures (including critical care time)  Medications Ordered in UC Medications - No data to display  Initial Impression / Assessment and Plan / UC Course  I have reviewed the triage vital signs and the nursing notes.  Pertinent labs & imaging results that were available during my care of the patient were reviewed by me and considered in my medical decision making (see chart for details).  Seasonal allergies Acute bacterial sinusitis Type 2 diabetes mellitus without complication  Discussed etiology of symptoms most likely viral versus allergies, discussed with patient, insistent on use of antibiotics specifically azithromycin, notify patient that that medication would not be prescribed however due to length of systems per his request he can need an antibiotic to cover for possible bacterial sinusitis even though that I did not feel that this was because of symptoms at this time,, discussed with patient that his last A1c level was 8.1 which was drawn on August 31, 2020, which is in a 37-month window following within current guideline recommendations, patient persistent on getting evaluation of A1c level, discussed with patient that no matter the results that I would not be managing his diabetes in urgent care and will not be adjusting his medications today, advised establishing care with a primary care doctor as soon as possible for management of diabetes, given to community resources as well as a PCP referral placed, advised that if A1c level high that patient can make dietary changes until seen by  primary doctor, given written handout about proper diabetic nutrition, verbalized understanding  1.  Augmentin 875/125 twice daily for 7 days 2.  Mucinex 600 mg twice daily as needed next.   3. Claritin 10 mg daily for 1 month Final Clinical Impressions(s) / UC Diagnoses   Final diagnoses:  Seasonal allergies  Acute bacterial sinusitis  Type 2 diabetes mellitus without complication, without long-term current use of insulin (HCC)     Discharge Instructions      Your symptoms today are likely being caused by a virus or an allergy flareup You are being given an antibiotic today only due to the length of your symptoms for possible bacterial sinusitis this may or may not be effective  Take Augmentin twice a day for 7 days  Use Mucinex twice a day as needed for congestion  Begin taking Claritin daily at bedtime, this medication is a antihistamine and is the treatment for allergies  Your hemoglobin A1c is pending, you will be notified of any concerning value, if level is elevated you will need to establish care with a primary doctor for management of your diabetes, until seen by primary care please adjust your diet, information has been placed in your packet about appropriate nutrition, care referral has been placed.  Someone will reach out to help you establish care.  Information for the community health and wellness center as well as the [facility] department has been placed in your packet, you may call to see if you can get an appointment  You may follow-up at urgent care for persistent symptoms that do not seem to be improving or resolving as needed  ED Prescriptions     Medication Sig Dispense Auth. Provider   guaiFENesin (MUCINEX) 600 MG 12 hr tablet Take 1 tablet (600 mg total) by mouth 2 (two) times daily. 30 tablet Shaw Dobek R, NP   loratadine (CLARITIN) 10 MG tablet Take 1 tablet (10 mg total) by mouth daily. 30 tablet Salli Quarry R, NP    amoxicillin-clavulanate (AUGMENTIN) 875-125 MG tablet Take 1 tablet by mouth every 12 (twelve) hours. 14 tablet Tamberlyn Midgley, Elita Boone, NP      PDMP not reviewed this encounter.   Valinda Hoar, NP 10/25/20 1134

## 2020-12-05 ENCOUNTER — Encounter (HOSPITAL_COMMUNITY): Payer: Self-pay

## 2020-12-05 ENCOUNTER — Other Ambulatory Visit: Payer: Self-pay

## 2020-12-05 ENCOUNTER — Ambulatory Visit (HOSPITAL_COMMUNITY)
Admission: EM | Admit: 2020-12-05 | Discharge: 2020-12-05 | Disposition: A | Discharge status: Home / Self Care | Payer: Self-pay | Attending: Emergency Medicine | Admitting: Emergency Medicine

## 2020-12-05 DIAGNOSIS — J028 Acute pharyngitis due to other specified organisms: Secondary | ICD-10-CM

## 2020-12-05 DIAGNOSIS — Z760 Encounter for issue of repeat prescription: Secondary | ICD-10-CM

## 2020-12-05 DIAGNOSIS — R051 Acute cough: Secondary | ICD-10-CM

## 2020-12-05 DIAGNOSIS — B9789 Other viral agents as the cause of diseases classified elsewhere: Secondary | ICD-10-CM

## 2020-12-05 MED ORDER — METHYLPREDNISOLONE SODIUM SUCC 125 MG IJ SOLR
125.0000 mg | Freq: Once | INTRAMUSCULAR | Status: AC
  Administered 2020-12-05: 125 mg via INTRAMUSCULAR

## 2020-12-05 MED ORDER — PREDNISONE 10 MG PO TABS: 20.0000 mg | ORAL_TABLET | Freq: Every day | ORAL | 0 refills | Status: DC

## 2020-12-05 MED ORDER — AMLODIPINE BESYLATE 5 MG PO TABS: 5.0000 mg | ORAL_TABLET | Freq: Every day | ORAL | 0 refills | Status: DC

## 2020-12-05 MED ORDER — METHYLPREDNISOLONE SODIUM SUCC 125 MG IJ SOLR
INTRAMUSCULAR | Status: AC
  Filled 2020-12-05: qty 2

## 2020-12-05 NOTE — ED Provider Notes (Signed)
[facility]    CSN: [id number] Arrival date & time: 12/05/20  1102      History   Chief Complaint Chief Complaint  Patient presents with   URI    HPI Patrick Hawkins is a 60 y.o. male.   Pt is here for cough congestion and sore throat x 1 week. Pt stats that he has used his inhaler with minimal relief. Denies any fevers. No sob. No n/v/d. Has taken otc meds with minimal relief. Pt also asking for a bp med refill.     Past Medical History:  Diagnosis Date   Acid reflux    Asthma    Diabetes mellitus without complication (HCC)    Gout    High cholesterol    Hypertension     Patient Active Problem List   Diagnosis Date Noted   Chest pain at rest 08/31/2020   Angioedema 08/31/2020   AKI (acute kidney injury) (HCC) 08/31/2020    Past Surgical History:  Procedure Laterality Date   LEFT HEART CATH AND CORONARY ANGIOGRAPHY N/A 08/31/2020   Procedure: LEFT HEART CATH AND CORONARY ANGIOGRAPHY;  Surgeon: Tonny Bollman, MD;  Location: [facility] INVASIVE CV LAB;  Service: Cardiovascular;  Laterality: N/A;       Home Medications    Prior to Admission medications   Medication Sig Start Date End Date Taking? Authorizing Provider  predniSONE (DELTASONE) 10 MG tablet Take 2 tablets (20 mg total) by mouth daily. 12/05/20  Yes Coralyn Mark, NP  albuterol (VENTOLIN HFA) 108 (90 Base) MCG/ACT inhaler Inhale 2 puffs into the lungs every 6 (six) hours as needed for wheezing or shortness of breath. 08/31/20   Madelyn Flavors A, MD  amLODipine (NORVASC) 5 MG tablet Take 1 tablet (5 mg total) by mouth daily for 180 doses. 12/05/20 06/03/21  Coralyn Mark, NP  amoxicillin-clavulanate (AUGMENTIN) 875-125 MG tablet Take 1 tablet by mouth every 12 (twelve) hours. 10/23/20   White, Elita Boone, NP  atorvastatin (LIPITOR) 10 MG tablet Take 1 tablet (10 mg total) by mouth daily. 09/01/20 02/28/21  Darlin Drop, DO  EPINEPHrine 0.3 mg/0.3 mL IJ SOAJ injection Inject 0.3 mg into the  muscle as needed for anaphylaxis. 09/01/20   Darlin Drop, DO  famotidine (PEPCID) 20 MG tablet Take 1 tablet (20 mg total) by mouth 2 (two) times daily for 3 days. 09/01/20 09/04/20  Darlin Drop, DO  guaiFENesin (MUCINEX) 600 MG 12 hr tablet Take 1 tablet (600 mg total) by mouth 2 (two) times daily. 10/23/20   White, Elita Boone, NP  loratadine (CLARITIN) 10 MG tablet Take 1 tablet (10 mg total) by mouth daily. 10/23/20   Valinda Hoar, NP  metFORMIN (GLUCOPHAGE) 1000 MG tablet Take 1 tablet (1,000 mg total) by mouth 2 (two) times daily with a meal. 09/01/20 02/28/21  Darlin Drop, DO  sildenafil (VIAGRA) 100 MG tablet Take 1 tablet (100 mg total) by mouth daily as needed for erectile dysfunction. 03/08/20   Merrilee Jansky, MD  tadalafil (CIALIS) 10 MG tablet Take 10 mg by mouth daily as needed for erectile dysfunction.  03/08/20  [provider]  traZODone (DESYREL) 50 MG tablet Take 1 tablet (50 mg total) by mouth at bedtime. 05/30/19 03/08/20  LampteyBritta Mccreedy, MD    Family History Family History  Problem Relation Age of Onset   Diabetes Mother    Diabetes Father     Social History Social History   Tobacco Use  Smoking status: Some Days    Types: Cigars   Smokeless tobacco: Never  Substance Use Topics   Alcohol use: Yes    Alcohol/week: 36.0 standard drinks    Types: 36 Cans of beer per week    Comment: 6 bottles/night   Drug use: Never     Allergies   Ace inhibitors, Cat hair extract, Losartan, and Shellfish allergy   Review of Systems Review of Systems  Constitutional:  Negative for chills, fatigue and fever.  HENT:  Positive for congestion.   Eyes: Negative.   Respiratory:  Positive for cough. Negative for shortness of breath.   Cardiovascular: Negative.   Gastrointestinal: Negative.   Genitourinary: Negative.   Musculoskeletal: Negative.   Neurological: Negative.     Physical Exam Triage Vital Signs ED Triage Vitals  Enc Vitals Group     BP  12/05/20 1219 (!) 156/95     Pulse Rate 12/05/20 1219 86     Resp 12/05/20 1219 17     Temp 12/05/20 1219 98.6 F (37 C)     Temp Source 12/05/20 1219 Oral     SpO2 12/05/20 1219 97 %     Weight --      Height --      Head Circumference --      Peak Flow --      Pain Score 12/05/20 1220 2     Pain Loc --      Pain Edu? --      Excl. in GC? --    No data found.  Updated Vital Signs BP (!) 156/95 (BP Location: Right Arm)   Pulse 86   Temp 98.6 F (37 C) (Oral)   Resp 17   SpO2 97%   Visual Acuity Right Eye Distance:   Left Eye Distance:   Bilateral Distance:    Right Eye Near:   Left Eye Near:    Bilateral Near:     Physical Exam Constitutional:      Appearance: Normal appearance. He is not ill-appearing.  HENT:     Right Ear: Tympanic membrane normal.     Left Ear: Tympanic membrane normal.     Nose: Congestion present.     Mouth/Throat:     Mouth: Mucous membranes are moist.  Eyes:     Pupils: Pupils are equal, round, and reactive to light.  Cardiovascular:     Rate and Rhythm: Normal rate.  Pulmonary:     Effort: Pulmonary effort is normal.     Breath sounds: Normal breath sounds.  Abdominal:     General: Abdomen is flat.  Musculoskeletal:        General: Normal range of motion.     Cervical back: Normal range of motion.  Skin:    General: Skin is warm.  Neurological:     General: No focal deficit present.     Mental Status: He is alert.     UC Treatments / Results  Labs (all labs ordered are listed, but only abnormal results are displayed) Labs Reviewed - No data to display  EKG   Radiology No results found.  Procedures Procedures (including critical care time)  Medications Ordered in UC Medications  methylPREDNISolone sodium succinate (SOLU-MEDROL) 125 mg/2 mL injection 125 mg (125 mg Intramuscular Given 12/05/20 1258)    Initial Impression / Assessment and Plan / UC Course  I have reviewed the triage vital signs and the nursing  notes.  Pertinent labs & imaging results that were available during  my care of the patient were reviewed by me and considered in my medical decision making (see chart for details).     Take the steroids as prescribed  Use the inhaler as needed  Symptoms more viral in nature  Cough may cont for several days  If worse go to er  Final Clinical Impressions(s) / UC Diagnoses   Final diagnoses:  Acute cough  Sore throat (viral)  Medication refill   Discharge Instructions   None    ED Prescriptions     Medication Sig Dispense Auth. Provider   amLODipine (NORVASC) 5 MG tablet Take 1 tablet (5 mg total) by mouth daily for 180 doses. 180 tablet Maple Mirza L, NP   predniSONE (DELTASONE) 10 MG tablet Take 2 tablets (20 mg total) by mouth daily. 30 tablet Coralyn Mark, NP      PDMP not reviewed this encounter.   Coralyn Mark, NP 12/05/20 1318

## 2020-12-05 NOTE — ED Triage Notes (Signed)
Pt presents with non productive cough, congestion, and shortness of breath X 1 week with no relief with OTC medication and inhaler .

## 2021-01-28 ENCOUNTER — Other Ambulatory Visit: Payer: Self-pay

## 2021-01-28 ENCOUNTER — Ambulatory Visit (INDEPENDENT_AMBULATORY_CARE_PROVIDER_SITE_OTHER): Payer: Self-pay | Admitting: Internal Medicine

## 2021-01-28 ENCOUNTER — Encounter: Payer: Self-pay | Admitting: Internal Medicine

## 2021-01-28 VITALS — BP 161/92 | HR 87 | Temp 98.4°F | Ht 70.0 in | Wt 171.0 lb

## 2021-01-28 DIAGNOSIS — I10 Essential (primary) hypertension: Secondary | ICD-10-CM | POA: Insufficient documentation

## 2021-01-28 DIAGNOSIS — N529 Male erectile dysfunction, unspecified: Secondary | ICD-10-CM | POA: Insufficient documentation

## 2021-01-28 DIAGNOSIS — M1A4710 Other secondary chronic gout, right ankle and foot, without tophus (tophi): Secondary | ICD-10-CM

## 2021-01-28 DIAGNOSIS — E785 Hyperlipidemia, unspecified: Secondary | ICD-10-CM | POA: Insufficient documentation

## 2021-01-28 DIAGNOSIS — E119 Type 2 diabetes mellitus without complications: Secondary | ICD-10-CM | POA: Insufficient documentation

## 2021-01-28 HISTORY — DX: Male erectile dysfunction, unspecified: N52.9

## 2021-01-28 LAB — GLUCOSE, CAPILLARY: Glucose-Capillary: 170 mg/dL — ABNORMAL HIGH (ref 70–99)

## 2021-01-28 LAB — POCT GLYCOSYLATED HEMOGLOBIN (HGB A1C): Hemoglobin A1C: 8 % — AB (ref 4.0–5.6)

## 2021-01-28 MED ORDER — HYDROCHLOROTHIAZIDE 25 MG PO TABS: 25.0000 mg | ORAL_TABLET | Freq: Every day | ORAL | 1 refills | Status: DC

## 2021-01-28 MED ORDER — ATORVASTATIN CALCIUM 10 MG PO TABS: 10.0000 mg | ORAL_TABLET | Freq: Every day | ORAL | 1 refills | Status: DC

## 2021-01-28 MED ORDER — SILDENAFIL CITRATE 100 MG PO TABS: 100.0000 mg | ORAL_TABLET | Freq: Every day | ORAL | 2 refills | Status: DC | PRN

## 2021-01-28 NOTE — Assessment & Plan Note (Addendum)
Patient is living with T2DM and alcohol use disorder. Reports this has been a problem for many years. History of being treated for erectile dysfunction. Cialis did not work. Sildenafil helps patient with erections but unable to maintain.Reviewed and patient taking appropriately. He denies any depression today. Denies any side effects from Sildenafil. Will refill and send patient for further evaluation by urology.

## 2021-01-28 NOTE — Assessment & Plan Note (Signed)
Patient was started on statin after presenting to ED with Chest pain. Heart Catheterization 7/22 did not show CAD. Patient has modifiable risk factors of T2DM, HTN, and occasional smoker. Lipid panel as below.  Lipid Panel     Component Value Date/Time   CHOL 175 08/31/2020 0859   TRIG 201 (H) 08/31/2020 0859   HDL 40 (L) 08/31/2020 0859   CHOLHDL 4.4 08/31/2020 0859   VLDL 40 08/31/2020 0859   LDLCALC 95 08/31/2020 0859   Assessment/Plan: High ASCVD Risk score, 23.6, can reduce risk to 6% with management of risk factors.  -Continue atorvastatin 10 mg daily

## 2021-01-28 NOTE — Assessment & Plan Note (Addendum)
HPI: Patient's BP today is 161/91  with a goal of <130/80. The patient endorses adherence to amlodipine. History of angioedema on ACE.   Assessment/Plan: Continue amlodipine 5 mg Start HCTZ 25 mg daily BMP  Follow up in 1 month for BP check.

## 2021-01-28 NOTE — Progress Notes (Signed)
° °  CC: Erectile dysfunction, hypertension, hyperlipidemia, type 2 diabetes mellitus  HPI:Patrick Hawkins is a 60 y.o. male who presents to establish care in [facility] and to have the following chronic conditions monitored: Hypertension, hyperlipidemia, Type 2 diabetes, and erectile dysfunction. Please see individual problem based A/P for details.  Past Medical History:  Diagnosis Date   Acid reflux    Asthma    As a child   Diabetes mellitus without complication (HCC)    Gout    High cholesterol    Hypertension    Past Surgical History:  Procedure Laterality Date   LEFT HEART CATH AND CORONARY ANGIOGRAPHY N/A 08/31/2020   Procedure: LEFT HEART CATH AND CORONARY ANGIOGRAPHY;  Surgeon: Tonny Bollman, MD;  Location: [facility] INVASIVE CV LAB;  Service: Cardiovascular;  Laterality: N/A;   Family History  Problem Relation Age of Onset   Diabetes Mother    Diabetes Father    Social History   Tobacco Use   Smoking status: Some Days    Types: Cigars   Smokeless tobacco: Never  Substance Use Topics   Alcohol use: Yes    Alcohol/week: 36.0 standard drinks    Types: 36 Cans of beer per week    Comment: 6 bottles/night   Drug use: Never   Review of Systems:   Review of Systems  Constitutional:  Negative for chills and fever.  HENT:  Negative for congestion and sinus pain.   Eyes:  Negative for discharge and redness.  Respiratory:  Negative for cough and shortness of breath.   Cardiovascular:  Negative for chest pain and leg swelling.  Gastrointestinal:  Negative for abdominal pain and diarrhea.  Genitourinary:  Negative for dysuria and urgency.  Musculoskeletal:  Negative for falls and joint pain.  Neurological:  Negative for dizziness and headaches.  Endo/Heme/Allergies:  Negative for polydipsia. Does not bruise/bleed easily.  Psychiatric/Behavioral:  Negative for depression. The patient is not nervous/anxious.     Physical Exam: Vitals:   01/28/21 0908  BP: (!) 161/92  Pulse: 87   Temp: 98.4 F (36.9 C)  TempSrc: Oral  SpO2: 97%  Weight: 171 lb (77.6 kg)  Height: 5\' 10"  (1.778 m)   General: Nl weight, nl appearance  HE: Normocephalic, atraumatic , EOMI, Conjunctivae normal ENT: No congestion, no rhinorrhea, no exudate or erythema  Cardiovascular: Normal rate, regular rhythm.  No murmurs, rubs, or gallops Pulmonary : Effort normal, breath sounds normal. No wheezes, rales, or rhonchi Abdominal: soft, nontender,  bowel sounds present Musculoskeletal: no swelling , deformity, injury ,or tenderness in extremities, Skin: Warm, dry , no bruising, erythema, or rash Psychiatric/Behavioral:  normal mood, normal behavior    Assessment & Plan:   See Encounters Tab for problem based charting.  Patient discussed with Dr. 

## 2021-01-28 NOTE — Assessment & Plan Note (Signed)
HPI: Hgb A1c 8.1 % 3 months ago. Current A1c 8%, result returned after visit. Currently taking Metformin 1000 mg BID.   Assessment/Plan: Uncontrolled Type 2 diabetes mellitus. Will discuss targeting slight lower goal 7 to 7.5% when I call with lab results. Patient can do this with diet and exercise , or we can start additional medication.  - Continue Metformin 1000 Mg BID

## 2021-01-28 NOTE — Patient Instructions (Signed)
Thank you for trusting me with your care. To recap, today we discussed the following:   1. Erectile dysfunction, unspecified erectile dysfunction type - Referral to urology - sildenafil (VIAGRA) 100 MG tablet; Take 1 tablet (100 mg total) by mouth daily as needed for erectile dysfunction.  Dispense: 30 tablet; Refill: 2  2. Primary hypertension .BP: (!) 161/92  - CMP14 + Anion Gap - hydrochlorothiazide (HYDRODIURIL) 25 MG tablet; Take 1 tablet (25 mg total) by mouth daily.  Dispense: 30 tablet; Refill: 1 - Ambulatory referral to Urology  3. Type 2 diabetes mellitus without complication, without long-term current use of insulin (HCC) - POC Hbg A1C - Continue Metformin  4. Hyperlipidemia, unspecified hyperlipidemia type - atorvastatin (LIPITOR) 10 MG tablet; Take 1 tablet (10 mg total) by mouth daily.  Dispense: 180 tablet; Refill: 1

## 2021-01-29 LAB — CMP14 + ANION GAP
ALT: 21 IU/L (ref 0–44)
AST: 23 IU/L (ref 0–40)
Albumin/Globulin Ratio: 1.9 (ref 1.2–2.2)
Albumin: 4.5 g/dL (ref 3.8–4.8)
Alkaline Phosphatase: 85 IU/L (ref 44–121)
Anion Gap: 20 mmol/L — ABNORMAL HIGH (ref 10.0–18.0)
BUN/Creatinine Ratio: 16 (ref 10–24)
BUN: 17 mg/dL (ref 8–27)
Bilirubin Total: 0.5 mg/dL (ref 0.0–1.2)
CO2: 21 mmol/L (ref 20–29)
Calcium: 8.9 mg/dL (ref 8.6–10.2)
Chloride: 98 mmol/L (ref 96–106)
Creatinine, Ser: 1.07 mg/dL (ref 0.76–1.27)
Globulin, Total: 2.4 g/dL (ref 1.5–4.5)
Glucose: 165 mg/dL — ABNORMAL HIGH (ref 70–99)
Potassium: 4.2 mmol/L (ref 3.5–5.2)
Sodium: 139 mmol/L (ref 134–144)
Total Protein: 6.9 g/dL (ref 6.0–8.5)
eGFR: 79 mL/min/{1.73_m2} (ref >59)

## 2021-01-29 LAB — URIC ACID: Uric Acid: 7.9 mg/dL (ref 3.8–8.4)

## 2021-01-29 NOTE — Progress Notes (Signed)
Internal Medicine Clinic Attending  Case discussed with Dr. Steen  At the time of the visit.  We reviewed the resident's history and exam and pertinent patient test results.  I agree with the assessment, diagnosis, and plan of care documented in the resident's note.  

## 2021-02-07 ENCOUNTER — Telehealth: Payer: Self-pay | Admitting: Internal Medicine

## 2021-02-07 MED ORDER — ALLOPURINOL 100 MG PO TABS: 100.0000 mg | ORAL_TABLET | Freq: Every day | ORAL | 1 refills | Status: DC

## 2021-02-07 NOTE — Telephone Encounter (Signed)
Discussed recent lab work with patient. He will start allopurinol for Gout. He will need follow up to check uric acid level. He also has a new concern of night sweats. He reports this has been discussed and worked up by other physicians. He will call and schedule an appointment for further evaluation.

## 2021-02-28 ENCOUNTER — Ambulatory Visit (INDEPENDENT_AMBULATORY_CARE_PROVIDER_SITE_OTHER): Payer: 59 | Admitting: Student

## 2021-02-28 ENCOUNTER — Other Ambulatory Visit: Payer: Self-pay

## 2021-02-28 ENCOUNTER — Encounter: Payer: Self-pay | Admitting: Student

## 2021-02-28 VITALS — BP 144/90 | HR 93 | Temp 98.7°F | Ht 70.0 in | Wt 171.2 lb

## 2021-02-28 DIAGNOSIS — N529 Male erectile dysfunction, unspecified: Secondary | ICD-10-CM | POA: Diagnosis not present

## 2021-02-28 DIAGNOSIS — I10 Essential (primary) hypertension: Secondary | ICD-10-CM

## 2021-02-28 DIAGNOSIS — E119 Type 2 diabetes mellitus without complications: Secondary | ICD-10-CM

## 2021-02-28 DIAGNOSIS — R61 Generalized hyperhidrosis: Secondary | ICD-10-CM

## 2021-02-28 NOTE — Patient Instructions (Signed)
It was a pleasure meeting you!  Regarding the night sweats, we will get the testosterone lab in the morning.  We will also send you to rheumatology as you have also had joint pain in the past and a lab that was positive for possible autoimmune condition.    We will resend the urology referral.

## 2021-02-28 NOTE — Progress Notes (Signed)
° °  CC: F/u regarding night sweats   HPI:  [name] is a 61 y.o. F with a PMH per below who presents for follow up regarding night sweats. Please see problem based charting under encounters tab for further details.    Past Medical History:  Diagnosis Date   Acid reflux    Asthma    As a child   Diabetes mellitus without complication (HCC)    Gout    High cholesterol    Hypertension    Review of Systems:  Please see problem based charting under encounters tab for further details.    Physical Exam:  Vitals:   02/28/21 1326  BP: (!) 144/90  Pulse: 93  Temp: 98.7 F (37.1 C)  TempSrc: Oral  SpO2: 100%  Weight: 171 lb 3.2 oz (77.7 kg)  Height: 5\' 10"  (1.778 m)   Constitutional: Well-developed, well-nourished, and in no distress.  HENT:  Head: Normocephalic and atraumatic.  Eyes: EOM are normal.  Neck: Normal range of motion.  Cardiovascular: Normal rate, regular rhythm, intact distal pulses. No gallop and no friction rub.  No murmur heard. No lower extremity edema  Pulmonary: Non labored breathing on room air, no wheezing or rales  Abdominal: Soft. Normal bowel sounds. Non distended and non tender Musculoskeletal: Normal range of motion.        General: No tenderness or edema.  Neurological: Alert and oriented to person, place, and time. Non focal  Skin: Skin is warm and dry.    Assessment & Plan:   See Encounters Tab for problem based charting.  Patient discussed with Dr. 

## 2021-03-01 DIAGNOSIS — R61 Generalized hyperhidrosis: Secondary | ICD-10-CM | POA: Insufficient documentation

## 2021-03-01 MED ORDER — METFORMIN HCL 1000 MG PO TABS: 1000.0000 mg | ORAL_TABLET | Freq: Two times a day (BID) | ORAL | 2 refills | Status: DC

## 2021-03-01 NOTE — Assessment & Plan Note (Signed)
Patient reports that he has been dealing with night sweats for about 10 years now. He states that he feels the onset of his night sweats coincided with the initiation of metformin for his diabetes. He states that he often has to change his clothes at night about 3 times because he sweats so profusely. He has not had a fever, diarrhea, unintentional weight loss, risky sexual behavior, recent travel, or a known personal history of autoimmune diseases or malignancy.   He is a moderately heavy drinker but states that even when he went months without alcohol use he continued to have night sweats. He also notes that despite missing doses of his metformin for month stretches at a time he has had night sweats. 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 He has since been tested for TB. He states he has to get a chest x ray because his TST is positive due to receiving the vaccine as a child. He has been tested for HIV which was negative, but has been in a committed marriage for many years. He has also had his thyroid tested in the past and states that he was placed on a medication (does not recall which medication). He further notes that despite being on the medication for his thyroid he did not notice a change in his symptoms.   A/P: Unclear exact etiology of patients symptoms, though I suspect it is multifactorial in etiology with his heavy alcohol use contributing, his poorly controlled diabetes leading to nerve dysfunction, and possibly an autoimmune component given his mildly elevated ANA titer. Finally, given patient also reports 5-10 years of erectile dysfunction along with his night sweats he could also have hypogonadism.  -Discussed with patient that he should continue to decrease his alcohol intake and continue to exercise to get better control of his blood sugars.  -Will also obtain a AM testosterone level  -Refer patient to rheumatology for possible autoimmune condition, due  to positive ANA. Although when patient was initially tested his joint pain (knee) was noted to be due to gout.

## 2021-03-01 NOTE — Assessment & Plan Note (Signed)
Patient's A1c 8 at last clinic visit 01/2021. He is currently on metformin 1000mg  BID only. He is having some erectile dysfunction that is likely related to his poorly controlled blood sugars.   Patient would like to continue with current medications and diet and exercise. Will discuss adding additional antidiabetic medication at next clinic visit.

## 2021-03-01 NOTE — Assessment & Plan Note (Signed)
BP this clinic visit is improved 140/90 however patient is still not at goal. At last clinic visit 01/28/2021 we added HCTz 25mg  qd to his regimen of amlodipine 5mg .   A/P: Will have patient follow up with BP log in 3 months to decide if additional blood pressure medications needed. Also discussed with patient the importance of continuing to cut back on his alcohol use. He is currently drinking about 18 drinks per week. We discussed how this can affect his blood pressure.

## 2021-03-01 NOTE — Assessment & Plan Note (Signed)
Patient reports that he has a difficult time maintaining an erection. Discussed with him the importance of getting his blood sugars under control as well as his hypertension and the role this can play in his erectile dysfunction. We also discussed the urology referral given he recently changed insurance providers. This is primarily because he notes that medications are becoming less effective for him.

## 2021-03-04 NOTE — Progress Notes (Signed)
Internal Medicine Clinic Attending  Case discussed with Dr. Montez Morita  At the time of the visit.  We reviewed the residents history and exam and pertinent patient test results.  I agree with the assessment, diagnosis, and plan of care documented in the residents note.   Total testosterone level collected this visit will not be reliable as it was collected in the afternoon. This needs to be checked at 8 am or early morning for accurate interpretation.

## 2021-03-15 ENCOUNTER — Other Ambulatory Visit (INDEPENDENT_AMBULATORY_CARE_PROVIDER_SITE_OTHER): Payer: 59

## 2021-03-15 DIAGNOSIS — R61 Generalized hyperhidrosis: Secondary | ICD-10-CM | POA: Diagnosis not present

## 2021-03-19 ENCOUNTER — Encounter: Payer: Self-pay | Admitting: Urology

## 2021-03-19 ENCOUNTER — Other Ambulatory Visit: Payer: Self-pay

## 2021-03-19 ENCOUNTER — Ambulatory Visit (INDEPENDENT_AMBULATORY_CARE_PROVIDER_SITE_OTHER): Payer: 59 | Admitting: Urology

## 2021-03-19 VITALS — BP 162/87 | HR 77 | Ht 70.0 in | Wt 175.0 lb

## 2021-03-19 DIAGNOSIS — N529 Male erectile dysfunction, unspecified: Secondary | ICD-10-CM

## 2021-03-19 MED ORDER — TADALAFIL 10 MG PO TABS: 10.0000 mg | ORAL_TABLET | Freq: Every day | ORAL | 11 refills | Status: DC

## 2021-03-19 NOTE — Patient Instructions (Signed)
Erectile Dysfunction °Erectile dysfunction (ED) is the inability to get or keep an erection in order to have sexual intercourse. ED is considered a symptom of an underlying disorder and is not considered a disease. ED may include: °Inability to get an erection. °Lack of enough hardness of the erection to allow penetration. °Loss of erection before sex is finished. °What are the causes? °This condition may be caused by: °Physical causes, such as: °Artery problems. This may include heart disease, high blood pressure, atherosclerosis, and diabetes. °Hormonal problems, such as low testosterone. °Obesity. °Nerve problems. This may include back or pelvic injuries, multiple sclerosis, Parkinson's disease, spinal cord injury, and stroke. °Certain medicines, such as: °Pain relievers. °Antidepressants. °Blood pressure medicines and water pills (diuretics). °Cancer medicines. °Antihistamines. °Muscle relaxants. °Lifestyle factors, such as: °Use of drugs such as marijuana, cocaine, or opioids. °Excessive use of alcohol. °Smoking. °Lack of physical activity or exercise. °Psychological causes, such as: °Anxiety or stress. °Sadness or depression. °Exhaustion. °Fear about sexual performance. °Guilt. °What are the signs or symptoms? °Symptoms of this condition include: °Inability to get an erection. °Lack of enough hardness of the erection to allow penetration. °Loss of the erection before sex is finished. °Sometimes having normal erections, but with frequent unsatisfactory episodes. °Low sexual satisfaction in either partner due to erection problems. °A curved penis occurring with erection. The curve may cause pain, or the penis may be too curved to allow for intercourse. °Never having nighttime or morning erections. °How is this diagnosed? °This condition is often diagnosed by: °Performing a physical exam to find other diseases or specific problems with the penis. °Asking you detailed questions about the problem. °Doing tests,  such as: °Blood tests to check for diabetes mellitus or high cholesterol, or to measure hormone levels. °Other tests to check for underlying health conditions. °An ultrasound exam to check for scarring. °A test to check blood flow to the penis. °Doing a sleep study at home to measure nighttime erections. °How is this treated? °This condition may be treated by: °Medicines, such as: °Medicine taken by mouth to help you achieve an erection (oral medicine). °Hormone replacement therapy to replace low testosterone levels. °Medicine that is injected into the penis. Your health care provider may instruct you how to give yourself these injections at home. °Medicine that is delivered with a short applicator tube. The tube is inserted into the opening at the tip of the penis, which is the opening of the urethra. A tiny pellet of medicine is put in the urethra. The pellet dissolves and enhances erectile function. This is also called MUSE (medicated urethral system for erections) therapy. °Vacuum pump. This is a pump with a ring on it. The pump and ring are placed on the penis and used to create pressure that helps the penis become erect. °Penile implant surgery. In this procedure, you may receive: °An inflatable implant. This consists of cylinders, a pump, and a reservoir. The cylinders can be inflated with a fluid that helps to create an erection, and they can be deflated after intercourse. °A semi-rigid implant. This consists of two silicone rubber rods. The rods provide some rigidity. They are also flexible, so the penis can both curve downward in its normal position and become straight for sexual intercourse. °Blood vessel surgery to improve blood flow to the penis. During this procedure, a blood vessel from a different part of the body is placed into the penis to allow blood to flow around (bypass) damaged or blocked blood vessels. °Lifestyle changes,   such as exercising more, losing weight, and quitting smoking. °Follow  these instructions at home: °Medicines ° °Take over-the-counter and prescription medicines only as told by your health care provider. Do not increase the dosage without first discussing it with your health care provider. °If you are using self-injections, do injections as directed by your health care provider. Make sure you avoid any veins that are on the surface of the penis. After giving an injection, apply pressure to the injection site for 5 minutes. °Talk to your health care provider about how to prevent headaches while taking ED medicines. These medicines may cause a sudden headache due to the increase in blood flow in your body. °General instructions °Exercise regularly, as directed by your health care provider. Work with your health care provider to lose weight, if needed. °Do not use any products that contain nicotine or tobacco. These products include cigarettes, chewing tobacco, and vaping devices, such as e-cigarettes. If you need help quitting, ask your health care provider. °Before using a vacuum pump, read the instructions that come with the pump and discuss any questions with your health care provider. °Keep all follow-up visits. This is important. °Contact a health care provider if: °You feel nauseous. °You are vomiting. °You get sudden headaches while taking ED medicines. °You have any concerns about your sexual health. °Get help right away if: °You are taking oral or injectable medicines and you have an erection that lasts longer than 4 hours. If your health care provider is unavailable, go to the nearest emergency room for evaluation. An erection that lasts much longer than 4 hours can result in permanent damage to your penis. °You have severe pain in your groin or abdomen. °You develop redness or severe swelling of your penis. °You have redness spreading at your groin or lower abdomen. °You are unable to urinate. °You experience chest pain or a rapid heartbeat (palpitations) after taking oral  medicines. °These symptoms may represent a serious problem that is an emergency. Do not wait to see if the symptoms will go away. Get medical help right away. Call your local emergency services (911 in the [place] Do not drive yourself to the hospital. °Summary °Erectile dysfunction (ED) is the inability to get or keep an erection during sexual intercourse. °This condition is diagnosed based on a physical exam, your symptoms, and tests to determine the cause. Treatment varies depending on the cause and may include medicines, hormone therapy, surgery, or a vacuum pump. °You may need follow-up visits to make sure that you are using your medicines or devices correctly. °Get help right away if you are taking or injecting medicines and you have an erection that lasts longer than 4 hours. °This information is not intended to replace advice given to you by your health care provider. Make sure you discuss any questions you have with your health care provider. °Document Revised: 04/25/2020 Document Reviewed: 04/25/2020 °Elsevier Patient Education © 2022 [employer] ° °

## 2021-03-19 NOTE — Progress Notes (Signed)
° °  03/19/21 9:51 AM   Patrick Hawkins Filler [birth year] [id number]  CC: ED  HPI: 61 year old male with diabetes(hemoglobin A1c 8) and hypertension who presents with a long history of ED.  He previously was on sildenafil, as well as Cialis with mixed results over the last few years.  He feels like the sildenafil has decreased in effectiveness recently.  Recent testosterone was normal at 313 on 03/15/2021.  He denies any urinary symptoms.  Recent cardiac cath in July 2022 showed no significant obstructive disease, I reviewed the outside procedure note.  PMH: Past Medical History:  Diagnosis Date   Acid reflux    Asthma    As a child   Diabetes mellitus without complication (HCC)    Gout    High cholesterol    Hypertension     Surgical History: Past Surgical History:  Procedure Laterality Date   LEFT HEART CATH AND CORONARY ANGIOGRAPHY N/A 08/31/2020   Procedure: LEFT HEART CATH AND CORONARY ANGIOGRAPHY;  Surgeon: Tonny Bollman, MD;  Location: [facility] INVASIVE CV LAB;  Service: Cardiovascular;  Laterality: N/A;   Family History: Family History  Problem Relation Age of Onset   Diabetes Mother    Diabetes Father     Social History:  reports that he has been smoking cigars. He has never used smokeless tobacco. He reports current alcohol use of about 36.0 standard drinks per week. He reports that he does not use drugs.  Physical Exam: BP (!) 162/87 (BP Location: Left Arm, Patient Position: Sitting, Cuff Size: Normal)    Pulse 77    Ht 5\' 10"  (1.778 m)    Wt 175 lb (79.4 kg)    BMI 25.11 kg/m    Constitutional:  Alert and oriented, No acute distress. Cardiovascular: No clubbing, cyanosis, or edema. Respiratory: Normal respiratory effort, no increased work of breathing. GI: Abdomen is soft, nontender, nondistended, no abdominal masses   Laboratory Data: Testosterone normal, 313 Hemoglobin A1c 8  Assessment & Plan:   61 year old male with ED intermittently responsive to sildenafil and  Cialis.  We reviewed the AUA guidelines regarding erectile dysfunction, as well as his normal testosterone level of 313.  We reviewed the impact of his other comorbidities including diabetes and hypertension on ED.  We discussed options including PDE 5 inhibitors including scheduled Cialis, penile injections, vacuum erection device, or penile prosthesis.  He is interested in continuing with medications, I recommended taking the Cialis daily.  We reviewed this could also be taken just as needed.  Cialis 10 mg daily, okay to increase to 20 mg if needed RTC PA 3 months  66, MD 03/19/2021  [facility] [address] [place], [place] [phone]

## 2021-03-21 LAB — TESTOSTERONE: Testosterone: 313 ng/dL (ref 264–916)

## 2021-03-21 LAB — ANTINUCLEAR ANTIBODIES, IFA: ANA Titer 1: NEGATIVE

## 2021-03-27 ENCOUNTER — Telehealth: Payer: Self-pay | Admitting: *Deleted

## 2021-03-27 NOTE — Telephone Encounter (Signed)
Call from patient with c/o chest tightness .  Patient was advised to go to the ER as soon as possible.  Patient stated that he will go as soon as possible for evaluation.

## 2021-03-29 ENCOUNTER — Other Ambulatory Visit: Payer: Self-pay

## 2021-03-29 ENCOUNTER — Encounter: Payer: Self-pay | Admitting: Emergency Medicine

## 2021-03-29 ENCOUNTER — Observation Stay
Admission: EM | Admit: 2021-03-29 | Discharge: 2021-03-30 | Disposition: A | Discharge status: Home / Self Care | Payer: 59 | Attending: Hospitalist | Admitting: Hospitalist

## 2021-03-29 ENCOUNTER — Emergency Department: Payer: 59

## 2021-03-29 DIAGNOSIS — R9431 Abnormal electrocardiogram [ECG] [EKG]: Secondary | ICD-10-CM | POA: Diagnosis not present

## 2021-03-29 DIAGNOSIS — Z20822 Contact with and (suspected) exposure to covid-19: Secondary | ICD-10-CM | POA: Diagnosis not present

## 2021-03-29 DIAGNOSIS — I2510 Atherosclerotic heart disease of native coronary artery without angina pectoris: Secondary | ICD-10-CM | POA: Insufficient documentation

## 2021-03-29 DIAGNOSIS — F17290 Nicotine dependence, other tobacco product, uncomplicated: Secondary | ICD-10-CM | POA: Diagnosis not present

## 2021-03-29 DIAGNOSIS — R072 Precordial pain: Secondary | ICD-10-CM | POA: Diagnosis not present

## 2021-03-29 DIAGNOSIS — Z888 Allergy status to other drugs, medicaments and biological substances status: Secondary | ICD-10-CM

## 2021-03-29 DIAGNOSIS — Z7984 Long term (current) use of oral hypoglycemic drugs: Secondary | ICD-10-CM | POA: Insufficient documentation

## 2021-03-29 DIAGNOSIS — Z79899 Other long term (current) drug therapy: Secondary | ICD-10-CM | POA: Insufficient documentation

## 2021-03-29 DIAGNOSIS — E119 Type 2 diabetes mellitus without complications: Secondary | ICD-10-CM

## 2021-03-29 DIAGNOSIS — I160 Hypertensive urgency: Secondary | ICD-10-CM

## 2021-03-29 DIAGNOSIS — J45909 Unspecified asthma, uncomplicated: Secondary | ICD-10-CM | POA: Insufficient documentation

## 2021-03-29 DIAGNOSIS — R0789 Other chest pain: Principal | ICD-10-CM | POA: Insufficient documentation

## 2021-03-29 DIAGNOSIS — R079 Chest pain, unspecified: Secondary | ICD-10-CM

## 2021-03-29 DIAGNOSIS — Z7982 Long term (current) use of aspirin: Secondary | ICD-10-CM | POA: Insufficient documentation

## 2021-03-29 DIAGNOSIS — I10 Essential (primary) hypertension: Secondary | ICD-10-CM

## 2021-03-29 DIAGNOSIS — I200 Unstable angina: Secondary | ICD-10-CM

## 2021-03-29 HISTORY — DX: Hypertensive urgency: I16.0

## 2021-03-29 HISTORY — DX: Abnormal electrocardiogram (ECG) (EKG): R94.31

## 2021-03-29 LAB — CBC
HCT: 40 % (ref 39.0–52.0)
Hemoglobin: 13.2 g/dL (ref 13.0–17.0)
MCH: 27.4 pg (ref 26.0–34.0)
MCHC: 33 g/dL (ref 30.0–36.0)
MCV: 83.2 fL (ref 80.0–100.0)
Platelets: 265 10*3/uL (ref 150–400)
RBC: 4.81 MIL/uL (ref 4.22–5.81)
RDW: 13.6 % (ref 11.5–15.5)
WBC: 7.5 10*3/uL (ref 4.0–10.5)
nRBC: 0 % (ref 0.0–0.2)

## 2021-03-29 LAB — RESP PANEL BY RT-PCR (FLU A&B, COVID) ARPGX2
Influenza A by PCR: NEGATIVE
Influenza B by PCR: NEGATIVE
SARS Coronavirus 2 by RT PCR: NEGATIVE

## 2021-03-29 LAB — BASIC METABOLIC PANEL
Anion gap: 7 (ref 5–15)
BUN: 18 mg/dL (ref 8–23)
CO2: 26 mmol/L (ref 22–32)
Calcium: 9 mg/dL (ref 8.9–10.3)
Chloride: 102 mmol/L (ref 98–111)
Creatinine, Ser: 1.2 mg/dL (ref 0.61–1.24)
GFR, Estimated: >60 mL/min (ref >60)
Glucose, Bld: 178 mg/dL — ABNORMAL HIGH (ref 70–99)
Potassium: 4.1 mmol/L (ref 3.5–5.1)
Sodium: 135 mmol/L (ref 135–145)

## 2021-03-29 LAB — TROPONIN I (HIGH SENSITIVITY)
Troponin I (High Sensitivity): 6 ng/L (ref <18)
Troponin I (High Sensitivity): 6 ng/L (ref <18)

## 2021-03-29 LAB — GLUCOSE, CAPILLARY: Glucose-Capillary: 124 mg/dL — ABNORMAL HIGH (ref 70–99)

## 2021-03-29 MED ORDER — ACETAMINOPHEN 325 MG PO TABS: 650.0000 mg | ORAL_TABLET | ORAL | Status: DC | PRN

## 2021-03-29 MED ORDER — ENOXAPARIN SODIUM 40 MG/0.4ML IJ SOSY
40.0000 mg | PREFILLED_SYRINGE | INTRAMUSCULAR | Status: DC
  Administered 2021-03-29: 40 mg via SUBCUTANEOUS
  Filled 2021-03-29: qty 0.4

## 2021-03-29 MED ORDER — NITROGLYCERIN 0.4 MG SL SUBL
0.4000 mg | SUBLINGUAL_TABLET | SUBLINGUAL | Status: DC | PRN
  Administered 2021-03-29: 0.4 mg via SUBLINGUAL
  Filled 2021-03-29: qty 1

## 2021-03-29 MED ORDER — INSULIN ASPART 100 UNIT/ML IJ SOLN
0.0000 [IU] | Freq: Three times a day (TID) | INTRAMUSCULAR | Status: DC
  Administered 2021-03-30: 3 [IU] via SUBCUTANEOUS
  Filled 2021-03-29: qty 1

## 2021-03-29 MED ORDER — HYDROCHLOROTHIAZIDE 25 MG PO TABS
25.0000 mg | ORAL_TABLET | Freq: Every day | ORAL | Status: DC
  Administered 2021-03-29 – 2021-03-30 (×2): 25 mg via ORAL
  Filled 2021-03-29 (×2): qty 1

## 2021-03-29 MED ORDER — NITROGLYCERIN 0.4 MG SL SUBL: 0.4000 mg | SUBLINGUAL_TABLET | SUBLINGUAL | Status: DC | PRN

## 2021-03-29 MED ORDER — ASPIRIN 81 MG PO CHEW
324.0000 mg | CHEWABLE_TABLET | Freq: Once | ORAL | Status: AC
Start: 2021-03-29 — End: 2021-03-29
  Administered 2021-03-29: 324 mg via ORAL
  Filled 2021-03-29: qty 4

## 2021-03-29 MED ORDER — ATORVASTATIN CALCIUM 20 MG PO TABS
10.0000 mg | ORAL_TABLET | Freq: Every day | ORAL | Status: DC
  Administered 2021-03-30: 10 mg via ORAL
  Filled 2021-03-29: qty 1

## 2021-03-29 MED ORDER — METFORMIN HCL 500 MG PO TABS
1000.0000 mg | ORAL_TABLET | Freq: Two times a day (BID) | ORAL | Status: DC
  Administered 2021-03-30: 1000 mg via ORAL
  Filled 2021-03-29: qty 2

## 2021-03-29 MED ORDER — ONDANSETRON HCL 4 MG/2ML IJ SOLN: 4.0000 mg | Freq: Four times a day (QID) | INTRAMUSCULAR | Status: DC | PRN

## 2021-03-29 MED ORDER — MORPHINE SULFATE (PF) 4 MG/ML IV SOLN
4.0000 mg | INTRAVENOUS | Status: DC | PRN
  Filled 2021-03-29: qty 1

## 2021-03-29 MED ORDER — ASPIRIN EC 81 MG PO TBEC
81.0000 mg | DELAYED_RELEASE_TABLET | Freq: Every day | ORAL | Status: DC
  Administered 2021-03-30: 81 mg via ORAL
  Filled 2021-03-29: qty 1

## 2021-03-29 MED ORDER — INSULIN ASPART 100 UNIT/ML IJ SOLN: 0.0000 [IU] | Freq: Every day | INTRAMUSCULAR | Status: DC

## 2021-03-29 NOTE — ED Notes (Signed)
Pt refusing morphine at this time,due to no pain. Per EDP Quentin Cornwall give Nitro for BP. See mar for details.

## 2021-03-29 NOTE — ED Notes (Signed)
Pt to ED for central chest tightness that started about 5 days ago, denies SOB and abdominal pain.  Denies Cardiac Hx.   Pt is A&OX4.

## 2021-03-29 NOTE — ED Notes (Signed)
Activated code stemi w/[facility]

## 2021-03-29 NOTE — ED Notes (Signed)
Green top and [place] top collected and sent down to lab.

## 2021-03-29 NOTE — ED Notes (Addendum)
Canceled Code Stemi w/[facility]

## 2021-03-29 NOTE — H&P (Addendum)
History and Physical    Patient: Patrick Hawkins RCV:[id number] DOB: 1960/03/16 DOA: 03/29/2021 DOS: the patient was seen and examined on 03/29/2021 PCP: Marolyn Haller, MD  Patient coming from: Home  Chief Complaint:  Chief Complaint  Patient presents with   Chest Pain    HPI: Patrick Hawkins is a 61 y.o. male with medical history significant of DM, HTN, ACE related angioedema, nonobstructive CAD on cardiac cath July 2022, who presented as a code STEMI after presenting with 5-day history of chest pain, with EKG changes concerning for lateral STEMI. Work-up in the ED reveals troponin of 6x2  Patient was seen in consultation by Dr. Kirke Corin who after review of patient's history, negative troponins, cancel code STEMI and opined, possible unstable angina, that EKG changes could be due to left ventricular hypertrophy with significant repolarization abnormalities, recommending observation and blood pressure control as well as echocardiogram, nitroglycerin for chest pain and amlodipine for blood pressure and possible coronary spasm.  No heparin for now given negative troponins. Hospitalist consulted for admission.    Other data reviewed; Vitals: BP up to 187/100 with otherwise normal vitals Blood work: Troponin 6-6, CBC and BMP within normal limits except for slightly elevated blood glucose of 178.  COVID and flu negative.  Chest x-ray with no acute findings   Review of Systems: As mentioned in the history of present illness. All other systems reviewed and are negative. Past Medical History:  Diagnosis Date   Acid reflux    Asthma    As a child   Diabetes mellitus without complication (HCC)    Gout    High cholesterol    Hypertension    Past Surgical History:  Procedure Laterality Date   LEFT HEART CATH AND CORONARY ANGIOGRAPHY N/A 08/31/2020   Procedure: LEFT HEART CATH AND CORONARY ANGIOGRAPHY;  Surgeon: Tonny Bollman, MD;  Location: [facility] INVASIVE CV LAB;  Service: Cardiovascular;   Laterality: N/A;   Social History:  reports that he has been smoking cigars. He has never used smokeless tobacco. He reports current alcohol use of about 36.0 standard drinks per week. He reports that he does not use drugs.  Allergies  Allergen Reactions   Ace Inhibitors Swelling    Angioedema 08/31/2020   Cat Hair Extract Shortness Of Breath   Losartan Swelling   Shellfish Allergy Anaphylaxis and Swelling    Other reaction(s): Angioedema    Family History  Problem Relation Age of Onset   Diabetes Mother    Diabetes Father     Prior to Admission medications   Medication Sig Start Date End Date Taking? Authorizing Provider  albuterol (VENTOLIN HFA) 108 (90 Base) MCG/ACT inhaler Inhale 2 puffs into the lungs every 6 (six) hours as needed for wheezing or shortness of breath. 08/31/20   Madelyn Flavors A, MD  amLODipine (NORVASC) 5 MG tablet Take 1 tablet (5 mg total) by mouth daily for 180 doses. 12/05/20 06/03/21  Coralyn Mark, NP  atorvastatin (LIPITOR) 10 MG tablet Take 1 tablet (10 mg total) by mouth daily. 01/28/21 07/27/21  Albertha Ghee, MD  hydrochlorothiazide (HYDRODIURIL) 25 MG tablet Take 1 tablet (25 mg total) by mouth daily. 01/28/21   Albertha Ghee, MD  loratadine (CLARITIN) 10 MG tablet Take 1 tablet (10 mg total) by mouth daily. 10/23/20   Valinda Hoar, NP  metFORMIN (GLUCOPHAGE) 1000 MG tablet Take 1 tablet (1,000 mg total) by mouth 2 (two) times daily with a meal. 03/01/21 11/26/21  Marolyn Haller, MD  sildenafil (  VIAGRA) 100 MG tablet Take 1 tablet (100 mg total) by mouth daily as needed for erectile dysfunction. 01/28/21   Albertha Ghee, MD  tadalafil (CIALIS) 10 MG tablet Take 1 tablet (10 mg total) by mouth daily. 03/19/21   Sondra Come, MD  traZODone (DESYREL) 50 MG tablet Take 1 tablet (50 mg total) by mouth at bedtime. 05/30/19 03/08/20  Merrilee Jansky, MD    Physical Exam: Vitals:   03/29/21 1836 03/29/21 1837 03/29/21 1900 03/29/21 1930  BP: (!)  187/100  (!) 171/102 (!) 158/91  Pulse: 95  94 86  Resp: 19  15 19   Temp:      TempSrc:      SpO2: 99%  97% 100%  Weight:  70.8 kg    Height:       Physical Exam Vitals and nursing note reviewed.  Constitutional:      General: He is not in acute distress.    Appearance: Normal appearance.  HENT:     Head: Normocephalic and atraumatic.  Cardiovascular:     Rate and Rhythm: Normal rate and regular rhythm.     Pulses: Normal pulses.     Heart sounds: Normal heart sounds. No murmur heard. Pulmonary:     Effort: Pulmonary effort is normal.     Breath sounds: Normal breath sounds. No wheezing or rhonchi.  Abdominal:     General: Bowel sounds are normal.     Palpations: Abdomen is soft.     Tenderness: There is no abdominal tenderness.  Musculoskeletal:        General: No swelling or tenderness. Normal range of motion.     Cervical back: Normal range of motion and neck supple.  Skin:    General: Skin is warm and dry.  Neurological:     General: No focal deficit present.     Mental Status: He is alert. Mental status is at baseline.  Psychiatric:        Mood and Affect: Mood normal.        Behavior: Behavior normal.     Data Reviewed: Notes from primary care and specialist visits, past discharge summaries. Prior diagnostic testing as applicable to current admission diagnoses Updated medications and problem lists for reconciliation ED course, including vitals, labs, imaging, treatment and response to treatment Triage notes and ED providers notes   Assessment and Plan: * Chest pain History of nonobstructive CAD on cardiac cath July 2022, Ruled out for STEMI by cardiologist, Dr. 02-24-1980, possible unstable angina  Hold off on heparin for now given negative troponins Nitroglycerin as needed chest pain Aspirin daily Cardiology consulted to follow     Abnormal EKG Per Dr. Arida:EKG changes could be due to left ventricular hypertrophy with significant repolarization  abnormalities Echo ordered  Hypertensive urgency BP 187/100 Continue amlodipine 5 mg, HCTZ 25 mg. Avoid ACE inhibitor due to past angioedema  Allergy to ACE inhibitors - angioedema Avoid ACE inhibitor, as well as ARB  Type 2 diabetes mellitus (HCC) Continue metformin Sliding scale insulin coverage       Advance Care Planning:   Code Status: Prior full  Consults: cardiology  Family Communication: none  Severity of Illness: The appropriate patient status for this patient is OBSERVATION. Observation status is judged to be reasonable and necessary in order to provide the required intensity of service to ensure the patient's safety. The patient's presenting symptoms, physical exam findings, and initial radiographic and laboratory data in the context of their medical condition is felt  to place them at decreased risk for further clinical deterioration. Furthermore, it is anticipated that the patient will be medically stable for discharge from the hospital within 2 midnights of admission.   Author: Andris Baumann, MD 03/29/2021 7:40 PM  For on call review [email or web address].

## 2021-03-29 NOTE — Consult Note (Signed)
Cardiology Consultation:   Patient ID: Patrick Hawkins MRN: [id number]; DOB: 1960-03-26  Admit date: 03/29/2021 Date of Consult: 03/29/2021  PCP:  Marolyn Haller, MD   [facility] Providers Cardiologist:  None        Patient Profile:   Patrick Hawkins is a 61 y.o. male with a hx of essential hypertension, type 2 diabetes and gout who is being seen 03/29/2021 for the evaluation of possible lateral ST elevation myocardial infarction at the request of Dr. Roxan Hockey.  History of Present Illness:   Patrick Hawkins is a 61 year old male with known history of type 2 diabetes and uncontrolled essential hypertension.  He presented in July 2022 with chest tightness of 3 days duration.  His EKG showed possible lateral ST elevation myocardial infarction and thus he underwent emergent cardiac catheterization by Dr. Excell Seltzer at [facility] which showed only mild irregularities in the proximal mid LAD with no obstructive disease.  He ruled out for myocardial infarction at that time.  He was supposed to be on 2 hypertensive medications but does admit that he does not take his medications regularly.  He reports increased stress over the last month. He presented with substernal chest tightness of 5 days duration very similar to what he had back in July of last year.  The pain is overall mild and does waxe and wane.  He had an EKG done which was worrisome for lateral ST elevation with reciprocal changes in the inferior leads.  When compared to his prior EKGs, the changes are more pronounced than before but nonetheless are not new. He denies recreational drug use.  He smokes cigars occasionally and does not drink alcohol but not excessively. He did have ACE inhibitor induced angioedema last year.   Past Medical History:  Diagnosis Date   Acid reflux    Asthma    As a child   Diabetes mellitus without complication (HCC)    Gout    High cholesterol    Hypertension     Past Surgical History:  Procedure  Laterality Date   LEFT HEART CATH AND CORONARY ANGIOGRAPHY N/A 08/31/2020   Procedure: LEFT HEART CATH AND CORONARY ANGIOGRAPHY;  Surgeon: Tonny Bollman, MD;  Location: [facility] INVASIVE CV LAB;  Service: Cardiovascular;  Laterality: N/A;     Home Medications:  Prior to Admission medications   Medication Sig Start Date End Date Taking? Authorizing Provider  albuterol (VENTOLIN HFA) 108 (90 Base) MCG/ACT inhaler Inhale 2 puffs into the lungs every 6 (six) hours as needed for wheezing or shortness of breath. 08/31/20   Madelyn Flavors A, MD  amLODipine (NORVASC) 5 MG tablet Take 1 tablet (5 mg total) by mouth daily for 180 doses. 12/05/20 06/03/21  Coralyn Mark, NP  atorvastatin (LIPITOR) 10 MG tablet Take 1 tablet (10 mg total) by mouth daily. 01/28/21 07/27/21  Albertha Ghee, MD  hydrochlorothiazide (HYDRODIURIL) 25 MG tablet Take 1 tablet (25 mg total) by mouth daily. 01/28/21   Albertha Ghee, MD  loratadine (CLARITIN) 10 MG tablet Take 1 tablet (10 mg total) by mouth daily. 10/23/20   Valinda Hoar, NP  metFORMIN (GLUCOPHAGE) 1000 MG tablet Take 1 tablet (1,000 mg total) by mouth 2 (two) times daily with a meal. 03/01/21 11/26/21  Marolyn Haller, MD  sildenafil (VIAGRA) 100 MG tablet Take 1 tablet (100 mg total) by mouth daily as needed for erectile dysfunction. 01/28/21   Albertha Ghee, MD  tadalafil (CIALIS) 10 MG tablet Take 1 tablet (10 mg total) by  mouth daily. 03/19/21   Sondra Come, MD  traZODone (DESYREL) 50 MG tablet Take 1 tablet (50 mg total) by mouth at bedtime. 05/30/19 03/08/20  Merrilee Jansky, MD    Inpatient Medications: Scheduled Meds:  Continuous Infusions:  PRN Meds: morphine injection, nitroGLYCERIN  Allergies:    Allergies  Allergen Reactions   Ace Inhibitors Swelling    Angioedema 08/31/2020   Cat Hair Extract Shortness Of Breath   Losartan Swelling   Shellfish Allergy Anaphylaxis and Swelling    Other reaction(s): Angioedema    Social History:    Social History   Socioeconomic History   Marital status: Significant Other    Spouse name: Not on file   Number of children: Not on file   Years of education: Not on file   Highest education level: Not on file  Occupational History   Not on file  Tobacco Use   Smoking status: Some Days    Types: Cigars   Smokeless tobacco: Never  Substance and Sexual Activity   Alcohol use: Yes    Alcohol/week: 36.0 standard drinks    Types: 36 Cans of beer per week    Comment: 6 bottles/night   Drug use: Never   Sexual activity: Yes  Other Topics Concern   Not on file  Social History Narrative   Not on file   Social Determinants of Health   Financial Resource Strain: Not on file  Food Insecurity: Not on file  Transportation Needs: Not on file  Physical Activity: Not on file  Stress: Not on file  Social Connections: Not on file  Intimate Partner Violence: Not on file    Family History:    Family History  Problem Relation Age of Onset   Diabetes Mother    Diabetes Father      ROS:  Please see the history of present illness.   All other ROS reviewed and negative.     Physical Exam/Data:   Vitals:   03/29/21 1649 03/29/21 1836 03/29/21 1837 03/29/21 1900  BP: (!) 149/92 (!) 187/100  (!) 171/102  Pulse: 87 95  94  Resp: 16 19  15   Temp: 98.6 F (37 C)     TempSrc: Oral     SpO2: 98% 99%  97%  Weight:   70.8 kg   Height:       No intake or output data in the 24 hours ending 03/29/21 1921 Last 3 Weights 03/29/2021 03/29/2021 03/19/2021  Weight (lbs) 156 lb 174 lb 13.2 oz 175 lb  Weight (kg) 70.761 kg 79.3 kg 79.379 kg     Body mass index is 22.38 kg/m.  General:  Well nourished, well developed, in no acute distress HEENT: normal Neck: no JVD Vascular: No carotid bruits; Distal pulses 2+ bilaterally Cardiac:  normal S1, S2; RRR; no murmur  Lungs:  clear to auscultation bilaterally, no wheezing, rhonchi or rales  Abd: soft, nontender, no hepatomegaly  Ext: no  edema Musculoskeletal:  No deformities, BUE and BLE strength normal and equal Skin: warm and dry  Neuro:  CNs 2-12 intact, no focal abnormalities noted Psych:  Normal affect   EKG:  The EKG was personally reviewed and demonstrates: Sinus rhythm with 1 to 2 mm of ST elevation in 1 and aVL and 1 mm of ST elevation in V2 and V3 with reciprocal ST depression in the inferior leads.  There is LVH with repolarization abnormalities.  Similar EKG last year. Telemetry:  Telemetry was personally reviewed and demonstrates:  Relevant CV Studies:   Laboratory Data:  High Sensitivity Troponin:   Recent Labs  Lab 03/29/21 1654 03/29/21 1838  TROPONINIHS 6 6     Chemistry Recent Labs  Lab 03/29/21 1654  NA 135  K 4.1  CL 102  CO2 26  GLUCOSE 178*  BUN 18  CREATININE 1.20  CALCIUM 9.0  GFRNONAA >60  ANIONGAP 7    No results for input(s): PROT, ALBUMIN, AST, ALT, ALKPHOS, BILITOT in the last 168 hours. Lipids No results for input(s): CHOL, TRIG, HDL, LABVLDL, LDLCALC, CHOLHDL in the last 168 hours.  Hematology Recent Labs  Lab 03/29/21 1654  WBC 7.5  RBC 4.81  HGB 13.2  HCT 40.0  MCV 83.2  MCH 27.4  MCHC 33.0  RDW 13.6  PLT 265   Thyroid No results for input(s): TSH, FREET4 in the last 168 hours.  BNPNo results for input(s): BNP, PROBNP in the last 168 hours.  DDimer No results for input(s): DDIMER in the last 168 hours.   Radiology/Studies:  DG Chest 2 View  Result Date: 03/29/2021 CLINICAL DATA:  Chest tightness for 5 days. EXAM: CHEST - 2 VIEW COMPARISON:  12/31/2018 FINDINGS: Cardiac silhouette and mediastinal contours are within normal limits. The lungs are clear. No pleural effusion or pneumothorax. Minimal multilevel degenerative disc changes of the midthoracic spine. IMPRESSION: No active cardiopulmonary disease. Electronically Signed   By: Neita Garnet M.D.   On: 03/29/2021 17:15     Assessment and Plan:   Possible unstable angina: I do not think the current  presentation is due to ST elevation myocardial infarction as the patient reported onset of symptoms 5 days ago and in spite of that his troponin continues to be negative.  In addition, I did personally review his cardiac catheterization that was done in July of last year that showed no evidence of obstructive coronary artery disease.  Based on this, code STEMI was canceled.  It is still not entirely clear why his EKG is abnormal but could be due to left ventricular hypertrophy with significant repolarization abnormalities.  We should also exclude hypertrophic cardiomyopathy.  I recommend observation for blood pressure control.  Recommend an echocardiogram.  Coronary spasm is a possibility.  Recommend treatment with nitroglycerin and amlodipine for blood pressure.  Also recommend checking urine drug screen. Essential hypertension: Blood pressure is not controlled.  Avoid ACE inhibitors due to prior angioedema.  Recommend treatment with nitrates and amlodipine for possible coronary spasm.    For questions or updates, please contact [facility] Please consult [email or web address] for contact info under    Signed, Lorine Bears, MD  03/29/2021 7:21 PM

## 2021-03-29 NOTE — Assessment & Plan Note (Signed)
Avoid ACE inhibitor, as well as ARB

## 2021-03-29 NOTE — ED Notes (Signed)
Initial EKG done, given to EDP Quentin Cornwall due to STEMI alert. Repeat EKG done. Pt has chest tightness 4/10, denies pain. See chart for details.

## 2021-03-29 NOTE — ED Provider Notes (Signed)
[facility] Provider Note    None    (approximate)   History   Chest Pain   HPI  Patrick Hawkins is a 61 y.o. male with a history of diabetes hypertension presents to the ER for evaluation of chest pain and tightness that has been ongoing for about 5 days.  Came into the ER today because it got worse.  Has not had any nausea or vomiting no recent fevers or chills.  Has had 1 episode like this before for which they performed a cardiac cath and I reviewed that cardiac cath in July 2022 there is no significant CAD.     Physical Exam   Triage Vital Signs: ED Triage Vitals  Enc Vitals Group     BP 03/29/21 1649 (!) 149/92     Pulse Rate 03/29/21 1649 87     Resp 03/29/21 1649 16     Temp 03/29/21 1649 98.6 F (37 C)     Temp Source 03/29/21 1649 Oral     SpO2 03/29/21 1649 98 %     Weight 03/29/21 1646 174 lb 13.2 oz (79.3 kg)     Height 03/29/21 1646 5\' 10"  (1.778 m)     Head Circumference --      Peak Flow --      Pain Score 03/29/21 1645 4     Pain Loc --      Pain Edu? --      Excl. in [place]? --     Most recent vital signs: Vitals:   03/29/21 1836 03/29/21 1900  BP: (!) 187/100 (!) 171/102  Pulse: 95 94  Resp: 19 15  Temp:    SpO2: 99% 97%     Constitutional: Alert  Eyes: Conjunctivae are normal.  Head: Atraumatic. Nose: No congestion/rhinnorhea. Mouth/Throat: Mucous membranes are moist.   Neck: Painless ROM.  Cardiovascular:   Good peripheral circulation. Respiratory: Normal respiratory effort.  No retractions.  Gastrointestinal: Soft and nontender.  Musculoskeletal:  no deformity Neurologic:  MAE spontaneously. No gross focal neurologic deficits are appreciated.  Skin:  Skin is warm, dry and intact. No rash noted. Psychiatric: Mood and affect are normal. Speech and behavior are normal.    ED Results / Procedures / Treatments   Labs (all labs ordered are listed, but only abnormal results are displayed) Labs Reviewed  BASIC  METABOLIC PANEL - Abnormal; Notable for the following components:      Result Value   Glucose, Bld 178 (*)    All other components within normal limits  RESP PANEL BY RT-PCR (FLU A&B, COVID) ARPGX2  CBC  TROPONIN I (HIGH SENSITIVITY)  TROPONIN I (HIGH SENSITIVITY)     EKG  ED ECG REPORT I, Merlyn Lot, the attending physician, personally viewed and interpreted this ECG.   Date: 03/29/2021  EKG Time: 18:20  Rate: 85  Rhythm: sinus  Axis: normal  Intervals:  normal  ST&T Change: anterio STEMI with reciprocal depressions,  more pronounced than previous ecg in 08/2020  ED ECG REPORT I, Merlyn Lot, the attending physician, personally viewed and interpreted this ECG.   Date: 03/29/2021  EKG Time: 18:25  Rate: 95  Rhythm: sinus  Axis: normal  Intervals:normal  ST&T Change: st elevation in I and aVL with deep twave inversion and depression in II,III, aVF and V5V6   RADIOLOGY Please see ED Course for my review and interpretation.  I personally reviewed all radiographic images ordered to evaluate for the above acute complaints and  reviewed radiology reports and findings.  These findings were personally discussed with the patient.  Please see medical record for radiology report.    PROCEDURES:  Critical Care performed: No  .1-3 Lead EKG Interpretation Performed by: Merlyn Lot, MD Authorized by: Merlyn Lot, MD     Interpretation: normal     ECG rate:  80   ECG rate assessment: normal     Rhythm: sinus rhythm     MEDICATIONS ORDERED IN ED: Medications  nitroGLYCERIN (NITROSTAT) SL tablet 0.4 mg (0.4 mg Sublingual Given 03/29/21 1846)  morphine (PF) 4 MG/ML injection 4 mg (has no administration in time range)  aspirin chewable tablet 324 mg (324 mg Oral Given 03/29/21 1835)     IMPRESSION / MDM / ASSESSMENT AND PLAN / ED COURSE  I reviewed the triage vital signs and the nursing notes.                              Differential diagnosis  includes, but is not limited to, ACS, pericarditis, esophagitis, boerhaaves, pe, dissection, pna, bronchitis, costochondritis  Patient presented to ER with symptoms as described above patient clinically appears well.  Due to uncertain delay in obtaining EKG 1 was performed when the patient was roomed in room 26 which showed concerning EKG findings concerning for STEMI though somewhat atypical given 5 days of symptoms.  Possible pericarditis but given the concerning EKG findings with chest pain and pressure code STEMI was immediately called for cardiology evaluation consultation have given aspirin nitro morphine.  Patient placed on cardiac monitor.  On my review of the chest x-ray there is no pneumothorax or significant cardiomegaly.  Clinical Course as of 03/29/21 1916  Fri Mar 29, 2021  1907 Patient has been evaluated by Dr. Fletcher Anon at bedside.  Given duration of symptoms negative troponins recent negative cath will cancel code STEMI but does recommend admission to hospital for hypertensive urgency observation echocardiogram and further cardiac work-up. [PR]  1911 Repeat troponin is stable.  Case discussed in consultation with hospitalist service who agreed to admit patient. [PR]    Clinical Course User Index [PR] Merlyn Lot, MD     FINAL CLINICAL IMPRESSION(S) / ED DIAGNOSES   Final diagnoses:  Chest pain, unspecified type  Hypertension, unspecified type  Abnormal EKG     Rx / DC Orders   ED Discharge Orders     None        Note:  This document was prepared using Dragon voice recognition software and may include unintentional dictation errors.    Merlyn Lot, MD 03/29/21 [phone]

## 2021-03-29 NOTE — Assessment & Plan Note (Addendum)
Per Dr. Arida:EKG changes could be due to left ventricular hypertrophy with significant repolarization abnormalities Echo ordered

## 2021-03-29 NOTE — Assessment & Plan Note (Signed)
BP 187/100 Continue amlodipine 5 mg, HCTZ 25 mg. Avoid ACE inhibitor due to past angioedema

## 2021-03-29 NOTE — Assessment & Plan Note (Addendum)
History of nonobstructive CAD on cardiac cath July 2022, Ruled out for STEMI by cardiologist, Dr. Kirke Corin, possible unstable angina  Hold off on heparin for now given negative troponins Nitroglycerin as needed chest pain Aspirin daily Cardiology consulted to follow

## 2021-03-29 NOTE — ED Notes (Signed)
Cardiology at bedside.

## 2021-03-29 NOTE — Assessment & Plan Note (Signed)
Continue metformin °Sliding scale insulin coverage °

## 2021-03-29 NOTE — ED Triage Notes (Addendum)
C/O chest tightness x 5 days.  NO SOB/ DOE.  Skin warm and dry . NAD  Also c/o stuffy nose, runny nose x 1 day. Denies fever

## 2021-03-30 ENCOUNTER — Encounter: Payer: Self-pay | Admitting: Internal Medicine

## 2021-03-30 ENCOUNTER — Observation Stay: Admit: 2021-03-30 | Payer: 59

## 2021-03-30 DIAGNOSIS — I10 Essential (primary) hypertension: Secondary | ICD-10-CM | POA: Diagnosis not present

## 2021-03-30 DIAGNOSIS — R079 Chest pain, unspecified: Secondary | ICD-10-CM

## 2021-03-30 DIAGNOSIS — R9431 Abnormal electrocardiogram [ECG] [EKG]: Secondary | ICD-10-CM

## 2021-03-30 DIAGNOSIS — R072 Precordial pain: Secondary | ICD-10-CM | POA: Diagnosis not present

## 2021-03-30 DIAGNOSIS — E119 Type 2 diabetes mellitus without complications: Secondary | ICD-10-CM

## 2021-03-30 LAB — URINE DRUG SCREEN, QUALITATIVE (ARMC ONLY)
Amphetamines, Ur Screen: NOT DETECTED
Barbiturates, Ur Screen: NOT DETECTED
Benzodiazepine, Ur Scrn: NOT DETECTED
Cannabinoid 50 Ng, Ur ~~LOC~~: NOT DETECTED
Cocaine Metabolite,Ur ~~LOC~~: NOT DETECTED
MDMA (Ecstasy)Ur Screen: NOT DETECTED
Methadone Scn, Ur: NOT DETECTED
Opiate, Ur Screen: NOT DETECTED
Phencyclidine (PCP) Ur S: NOT DETECTED
Tricyclic, Ur Screen: NOT DETECTED

## 2021-03-30 LAB — HIV ANTIBODY (ROUTINE TESTING W REFLEX): HIV Screen 4th Generation wRfx: NONREACTIVE

## 2021-03-30 LAB — GLUCOSE, CAPILLARY
Glucose-Capillary: 174 mg/dL — ABNORMAL HIGH (ref 70–99)
Glucose-Capillary: 142 mg/dL — ABNORMAL HIGH (ref 70–99)

## 2021-03-30 MED ORDER — TADALAFIL 10 MG PO TABS: 10.0000 mg | ORAL_TABLET | Freq: Every day | ORAL | 11 refills | Status: DC | PRN

## 2021-03-30 MED ORDER — AMLODIPINE BESYLATE 10 MG PO TABS
10.0000 mg | ORAL_TABLET | Freq: Every day | ORAL | Status: DC
  Administered 2021-03-30: 10 mg via ORAL
  Filled 2021-03-30: qty 1

## 2021-03-30 MED ORDER — ISOSORBIDE MONONITRATE ER 60 MG PO TB24
60.0000 mg | ORAL_TABLET | Freq: Every day | ORAL | Status: DC
  Administered 2021-03-30: 60 mg via ORAL
  Filled 2021-03-30: qty 1

## 2021-03-30 MED ORDER — ASPIRIN 81 MG PO TBEC: 81.0000 mg | DELAYED_RELEASE_TABLET | Freq: Every day | ORAL | Status: DC

## 2021-03-30 NOTE — Progress Notes (Addendum)
Progress Note  Patient Name: Patrick Hawkins Date of Encounter: 03/30/2021  Primary Cardiologist: None  Subjective   Still w/ 3/10 midsternal chest soreness - worse w/ palpation.  No dyspnea.  Very eager to go home.  Inpatient Medications    Scheduled Meds:  aspirin EC  81 mg Oral Daily   atorvastatin  10 mg Oral Daily   enoxaparin (LOVENOX) injection  40 mg Subcutaneous Q24H   hydrochlorothiazide  25 mg Oral Daily   insulin aspart  0-15 Units Subcutaneous TID WC   insulin aspart  0-5 Units Subcutaneous QHS   metFORMIN  1,000 mg Oral BID WC   Continuous Infusions:  PRN Meds: acetaminophen, morphine injection, nitroGLYCERIN, ondansetron (ZOFRAN) IV   Vital Signs    Vitals:   03/29/21 1930 03/29/21 2055 03/29/21 2333 03/30/21 0418  BP: (!) 158/91 (!) 175/101 (!) 164/91 (!) 172/95  Pulse: 86 80 81 81  Resp: 19 16 16    Temp:  98.7 F (37.1 C) 98.1 F (36.7 C) 98.7 F (37.1 C)  TempSrc:      SpO2: 100% 100% 99% 100%  Weight:      Height:       No intake or output data in the 24 hours ending 03/30/21 0803 Filed Weights   03/29/21 1646 03/29/21 1837  Weight: 79.3 kg 70.8 kg    Physical Exam   GEN: Well nourished, well developed, in no acute distress.  HEENT: Grossly normal.  Neck: Supple, no JVD, carotid bruits, or masses. Cardiac: RRR, no murmurs, rubs, or gallops. No clubbing, cyanosis, edema.  Radials 2+, DP/PT 2+ and equal bilaterally.   Chest wall is mildly sore to palpation. Respiratory:  Respirations regular and unlabored, clear to auscultation bilaterally. GI: Soft, nontender, nondistended, BS + x 4. MS: no deformity or atrophy. Skin: warm and dry, no rash. Neuro:  Strength and sensation are intact. Psych: AAOx3.  Normal affect.  Labs    Chemistry Recent Labs  Lab 03/29/21 1654  NA 135  K 4.1  CL 102  CO2 26  GLUCOSE 178*  BUN 18  CREATININE 1.20  CALCIUM 9.0  GFRNONAA >60  ANIONGAP 7     Hematology Recent Labs  Lab 03/29/21 1654   WBC 7.5  RBC 4.81  HGB 13.2  HCT 40.0  MCV 83.2  MCH 27.4  MCHC 33.0  RDW 13.6  PLT 265    Cardiac Enzymes  Recent Labs  Lab 03/29/21 1654 03/29/21 1838  TROPONINIHS 6 6      Lipids  Lab Results  Component Value Date   CHOL 175 08/31/2020   HDL 40 (L) 08/31/2020   LDLCALC 95 08/31/2020   TRIG 201 (H) 08/31/2020   CHOLHDL 4.4 08/31/2020    HbA1c  Lab Results  Component Value Date   HGBA1C 8.0 (A) 01/28/2021    Radiology    DG Chest 2 View  Result Date: 03/29/2021 CLINICAL DATA:  Chest tightness for 5 days. EXAM: CHEST - 2 VIEW COMPARISON:  12/31/2018 FINDINGS: Cardiac silhouette and mediastinal contours are within normal limits. The lungs are clear. No pleural effusion or pneumothorax. Minimal multilevel degenerative disc changes of the midthoracic spine. IMPRESSION: No active cardiopulmonary disease. Electronically Signed   By: 01/02/2019 M.D.   On: 03/29/2021 17:15    Telemetry    RSR - Personally Reviewed  Cardiac Studies   Cardiac Catheterization  7.22.2022  Diagnostic Dominance: Right  1.  Patent coronary arteries with no significant obstructive disease.  Mild  irregularity of the proximal and mid LAD with no significant stenosis.  Angiographically normal left main, left circumflex, ramus intermedius, and RCA. 2.  Normal LV systolic function with no regional wall motion abnormalities and LVEF estimated at 55 to 60%, normal LVEDP  _____________   Patient Profile     61 y.o. male w/ a h/o HTN, DMII, and gout, who was admitted 2/17 after presenting w/ a 3 day h/o chest tightness and abnl ECG (lateral ST elevation - not new but more pronounced), and nl HsTrops.  Assessment & Plan    1.  Precordial Chest Pain/nonobstructive:  Pt w/ prior h/o precordial chest pain and abnl ECG s/p cath @ [facility] in 08/2020 w/ minimal nonobs LAD dzs and nl LV fxn.  Notes that he has not been taking his BP meds as Rx (amlodipine & HCTZ).  Presented 2/17 w/ chest wall  pain/soreness and abnl ECG - more pronounced lateral ST elevation compared to priors.  Pain reproducible w/ palpation and HsTrops nl.  Cont w/ mild chest soreness this AM @ 3/10 - noncardiac. No plan for further ischemic eval.  BPs still trending 170's.  Amlodipine 10 started this AM.  Echo ordered and pending, however pt indicates that he has a family function that he needs to get to, and would like to be discharged.  If he leaves before echo completed, we can arrange for outpt f/u and echo. Cont ASA/statin.  2.  Uncontrolled HTN:  noncompliant w/ meds @ home.  Resume amlodipine.  Cont HCTZ.  Consider carvedilol if pressures remain elevated.  3.  HL:  LDL 95 in July - was statin naive @ that time.  Cont statin in setting of DM and nonobs CAD.  4.  DMII:  A1c 8.0 in December.  On metformin as outpt.  Signed, Nicolasa Ducking, NP  03/30/2021, 8:03 AM    For questions or updates, please contact   Please consult [email or web address] for contact info under Cardiology/STEMI.

## 2021-03-30 NOTE — Plan of Care (Signed)
DISCHARGE NOTE HOME KISHAUN EREKSON to be discharged home per MD order. Discussed prescriptions and follow up appointments with the patient. Medication list explained in detail. Patient verbalized understanding.  Skin clean, dry and intact without evidence of skin break down, no evidence of skin tears noted. IV catheter discontinued intact. Site without signs and symptoms of complications. Dressing and pressure applied. Pt denies pain at the site currently. No complaints noted.  Patient free of lines, drains, and wounds.   An After Visit Summary (AVS) was printed and given to the patient. Patient escorted out by his wife and discharged home via private auto.  Arlice Colt, RN

## 2021-03-30 NOTE — Discharge Summary (Signed)
Physician Discharge Summary   Patrick Hawkins  male DOB: [birth year]  [id number]  PCP: Rick Duff, MD  Admit date: 03/29/2021 Discharge date: 03/30/2021  Admitted From: home Disposition:  home CODE STATUS: Full code   Hospital Course:  For full details, please see H&P, progress notes, consult notes and ancillary notes.  Briefly,  Patrick Hawkins is a 61 y.o. male with medical history significant of DM, HTN, ACE related angioedema, nonobstructive CAD on cardiac cath July 2022, who presented as a code STEMI after presenting with 5-day history of chest pain, with EKG changes concerning for lateral STEMI.  Patient was seen by Dr. Fletcher Anon who after review of patient's history, negative troponins, canceled code STEMI.  * Chest pain, atypical --reproducible on palpation. Prior catheterization performed July 2022 nonobstructive disease noted Unable to definitively exclude coronary spasm though symptoms consistent with musculoskeletal etiology (works in a gas station lifting heavy beverages) --UDS neg   Abnormal EKG Possibly secondary to LVH with repolarization abnormality Echocardiogram ordered, but pt had to leave and prefers to have this done as outpatient.  --f/u with outpatient cardiology for Echo   Hypertensive urgency BP 187/100 --Pt had Rx for amlodipine 5 mg, HCTZ 25 mg, but was not taking either PTA. --pt was advised to start taking his BP meds as prescribed.   Allergy to ACE inhibitors - angioedema Avoid ACE inhibitor, as well as ARB   Type 2 diabetes mellitus ([place]) Continue home metformin   Discharge Diagnoses:  Principal Problem:   Chest pain Active Problems:   Type 2 diabetes mellitus ([place])   Hypertensive urgency   Allergy to ACE inhibitors - angioedema   Abnormal EKG     Discharge Instructions:  Allergies as of 03/30/2021       Reactions   Ace Inhibitors Swelling   Angioedema 08/31/2020   Cat Hair Extract Shortness Of Breath   Losartan  Swelling   Shellfish Allergy Anaphylaxis, Swelling   Other reaction(s): Angioedema   Lisinopril         Medication List     STOP taking these medications    loratadine 10 MG tablet Commonly known as: CLARITIN   sildenafil 100 MG tablet Commonly known as: Viagra       TAKE these medications    albuterol 108 (90 Base) MCG/ACT inhaler Commonly known as: VENTOLIN HFA Inhale 2 puffs into the lungs every 6 (six) hours as needed for wheezing or shortness of breath.   amLODipine 5 MG tablet Commonly known as: NORVASC Take 1 tablet (5 mg total) by mouth daily for 180 doses.   aspirin 81 MG EC tablet Take 1 tablet (81 mg total) by mouth daily. Swallow whole. Start taking on: March 31, 2021   atorvastatin 10 MG tablet Commonly known as: LIPITOR Take 1 tablet (10 mg total) by mouth daily.   diphenhydrAMINE 25 MG tablet Commonly known as: BENADRYL Take 25 mg by mouth every 6 (six) hours as needed.   hydrochlorothiazide 25 MG tablet Commonly known as: HYDRODIURIL Take 1 tablet (25 mg total) by mouth daily.   metFORMIN 1000 MG tablet Commonly known as: GLUCOPHAGE Take 1 tablet (1,000 mg total) by mouth 2 (two) times daily with a meal.   tadalafil 10 MG tablet Commonly known as: Cialis Take 1 tablet (10 mg total) by mouth daily as needed for erectile dysfunction. Home med. What changed:  when to take this reasons to take this additional instructions         Follow-up  Information     Rick Duff, MD Follow up in 1 week(s).   Contact information: [address] [address] [phone]         Wellington Hampshire, MD Follow up in 2 week(s).   Specialty: Cardiology Contact information: [address] [place] [address] [phone]                 Allergies  Allergen Reactions   Ace Inhibitors Swelling    Angioedema 08/31/2020   Cat Hair Extract Shortness Of Breath   Losartan Swelling   Shellfish  Allergy Anaphylaxis and Swelling    Other reaction(s): Angioedema   Lisinopril      The results of significant diagnostics from this hospitalization (including imaging, microbiology, ancillary and laboratory) are listed below for reference.   Consultations:   Procedures/Studies: DG Chest 2 View  Result Date: 03/29/2021 CLINICAL DATA:  Chest tightness for 5 days. EXAM: CHEST - 2 VIEW COMPARISON:  12/31/2018 FINDINGS: Cardiac silhouette and mediastinal contours are within normal limits. The lungs are clear. No pleural effusion or pneumothorax. Minimal multilevel degenerative disc changes of the midthoracic spine. IMPRESSION: No active cardiopulmonary disease. Electronically Signed   By: Yvonne Kendall M.D.   On: 03/29/2021 17:15      Labs: BNP (last 3 results) No results for input(s): BNP in the last 8760 hours. Basic Metabolic Panel: Recent Labs  Lab 03/29/21 1654  NA 135  K 4.1  CL 102  CO2 26  GLUCOSE 178*  BUN 18  CREATININE 1.20  CALCIUM 9.0   Liver Function Tests: No results for input(s): AST, ALT, ALKPHOS, BILITOT, PROT, ALBUMIN in the last 168 hours. No results for input(s): LIPASE, AMYLASE in the last 168 hours. No results for input(s): AMMONIA in the last 168 hours. CBC: Recent Labs  Lab 03/29/21 1654  WBC 7.5  HGB 13.2  HCT 40.0  MCV 83.2  PLT 265   Cardiac Enzymes: No results for input(s): CKTOTAL, CKMB, CKMBINDEX, TROPONINI in the last 168 hours. BNP: Invalid input(s): POCBNP CBG: Recent Labs  Lab 03/29/21 2101 03/30/21 0816 03/30/21 1125  GLUCAP 124* 174* 142*   D-Dimer No results for input(s): DDIMER in the last 72 hours. Hgb A1c No results for input(s): HGBA1C in the last 72 hours. Lipid Profile No results for input(s): CHOL, HDL, LDLCALC, TRIG, CHOLHDL, LDLDIRECT in the last 72 hours. Thyroid function studies No results for input(s): TSH, T4TOTAL, T3FREE, THYROIDAB in the last 72 hours.  Invalid input(s): FREET3 Anemia work up No  results for input(s): VITAMINB12, FOLATE, FERRITIN, TIBC, IRON, RETICCTPCT in the last 72 hours. Urinalysis No results found for: COLORURINE, APPEARANCEUR, [place], [place], GLUCOSEU, [place], [place], [place], PROTEINUR, UROBILINOGEN, NITRITE, LEUKOCYTESUR Sepsis Labs Invalid input(s): PROCALCITONIN,  WBC,  LACTICIDVEN Microbiology Recent Results (from the past 240 hour(s))  Resp Panel by RT-PCR (Flu A&B, Covid) Nasopharyngeal Swab     Status: None   Collection Time: 03/29/21  4:54 PM   Specimen: Nasopharyngeal Swab; Nasopharyngeal(NP) swabs in vial transport medium  Result Value Ref Range Status   SARS Coronavirus 2 by RT PCR NEGATIVE NEGATIVE Final    Comment: (NOTE) SARS-CoV-2 target nucleic acids are NOT DETECTED.  The SARS-CoV-2 RNA is generally detectable in upper respiratory specimens during the acute phase of infection. The lowest concentration of SARS-CoV-2 viral copies this assay can detect is 138 copies/mL. A negative result does not preclude SARS-Cov-2 infection and should not be used as the sole basis for treatment  or other patient management decisions. A negative result may occur with  improper specimen collection/handling, submission of specimen other than nasopharyngeal swab, presence of viral mutation(s) within the areas targeted by this assay, and inadequate number of viral copies(<138 copies/mL). A negative result must be combined with clinical observations, patient history, and epidemiological information. The expected result is Negative.  Fact Sheet for Patients:  EntrepreneurPulse.com.au  Fact Sheet for Healthcare Providers:  IncredibleEmployment.be  This test is no t yet approved or cleared by the [place] FDA and  has been authorized for detection and/or diagnosis of SARS-CoV-2 by FDA under an Emergency Use Authorization (EUA). This EUA will remain  in effect (meaning this test can be used) for the duration of  the COVID-19 declaration under Section 564(b)(1) of the Act, 21 U.S.C.section 360bbb-3(b)(1), unless the authorization is terminated  or revoked sooner.       Influenza A by PCR NEGATIVE NEGATIVE Final   Influenza B by PCR NEGATIVE NEGATIVE Final    Comment: (NOTE) The Xpert Xpress SARS-CoV-2/FLU/RSV plus assay is intended as an aid in the diagnosis of influenza from Nasopharyngeal swab specimens and should not be used as a sole basis for treatment. Nasal washings and aspirates are unacceptable for Xpert Xpress SARS-CoV-2/FLU/RSV testing.  Fact Sheet for Patients: EntrepreneurPulse.com.au  Fact Sheet for Healthcare Providers: IncredibleEmployment.be  This test is not yet approved or cleared by the [place] FDA and has been authorized for detection and/or diagnosis of SARS-CoV-2 by FDA under an Emergency Use Authorization (EUA). This EUA will remain in effect (meaning this test can be used) for the duration of the COVID-19 declaration under Section 564(b)(1) of the Act, 21 U.S.C. section 360bbb-3(b)(1), unless the authorization is terminated or revoked.  Performed at [facility], [address] [place], [place] [address]      Total time spend on discharging this patient, including the last patient exam, discussing the hospital stay, instructions for ongoing care as it relates to all pertinent caregivers, as well as preparing the medical discharge records, prescriptions, and/or referrals as applicable, is 40 minutes.    Enzo Bi, MD  [facility] 03/30/2021, 11:34 AM

## 2021-04-01 ENCOUNTER — Telehealth: Payer: Self-pay

## 2021-04-01 NOTE — Telephone Encounter (Signed)
LMOM to schedule follow up.

## 2021-04-01 NOTE — Telephone Encounter (Signed)
-----   Message from Creig Hines, NP sent at 03/30/2021 11:34 AM EST ----- Good morning,  Would you pls arrange for office f/u in 2-4 wks w/ me or Arida?  Thanks,  Thayer Ohm

## 2021-04-08 NOTE — Progress Notes (Signed)
Office Visit Note  Patient: Patrick Hawkins             Date of Birth: Dec 08, 1960           MRN: [id number]             PCP: Rick Duff, MD Referring: Lottie Mussel, MD Visit Date: 04/09/2021 Occupation: Insurance underwriter, now Theatre manager station  Subjective:  New Patient (Initial Visit) (Abnormal labs)   History of Present Illness: Patrick Hawkins is a 61 y.o. male here for evaluation of gouty arthritis with knee and foot pain and also concern for positive ANA with chronic night sweats. He had inflammatory joint pain most commonly in the feet and toes attributed to gout with episodic flares for years.  He was never on any long-term urate lowering treatment.  Last year he developed bilateral knee pain and swelling worse on the right side that persisted for about 2 months total duration.  He had aspiration of the right knee twice with local steroid injection before this resolved.  Fluid at that time showed minimal white blood cells and extracellular monosodium urate crystals.  He had a positive ANA test at low titer.  Since then he had at least 1 more episode of significant right knee pain and swelling with some persistence.  Repeat labs including a negative ANA earlier this year.  He does not have a lot of joint pain but notices some discomfort and when driving long distances or in fixed positions will become unable to fully extend the knee. He does get benefit with NSAIDs as needed. Symptoms are ongoing during the past 10 years suspected causes also including alcohol use or diabetes workup was unrevealing of chronic infections.  He has fairly drenching night sweats we will often change clothing up to 3 times per night.  This has been going on for several years now.  He reports social alcohol drinking estimates he drinks about 2 nights per week.  He has observed less night sweats and awakening when he drinks alcohol but feels worse in the mornings anyway.  He does not experience any sweating during  daytime or other ill effects during 3 or 4 days at a time in between drinking and has never had known withdrawal symptoms.  He denies any unintentional weight loss, lymphadenopathy, fevers, skin rashes, cough, or leg swelling.  Labs reviewed 03/2021 ANA neg Uric acid 7.9  05/2020 ANA 1:40 speckled RF neg ESR 17 CRP 3.9 Synovial fluid extracellular MSU 281 WBCs  Activities of Daily Living:  Patient reports morning stiffness for 0  none .   Patient Denies nocturnal pain.  Difficulty dressing/grooming: Denies Difficulty climbing stairs: Denies Difficulty getting out of chair: Denies Difficulty using hands for taps, buttons, cutlery, and/or writing: Denies  Review of Systems  Constitutional:  Positive for night sweats. Negative for fatigue.  HENT:  Negative for mouth dryness.   Eyes:  Negative for dryness.  Respiratory:  Negative for shortness of breath.   Cardiovascular:  Negative for swelling in legs/feet.  Gastrointestinal:  Negative for constipation.  Endocrine: Negative for cold intolerance.  Genitourinary:  Negative for difficulty urinating.  Musculoskeletal:  Negative for joint pain and joint pain.  Skin:  Negative for rash.  Allergic/Immunologic: Negative for susceptible to infections.  Neurological:  Positive for night sweats.  Hematological:  Negative for bruising/bleeding tendency.  Psychiatric/Behavioral:  Positive for sleep disturbance.    PMFS History:  Patient Active Problem List   Diagnosis Date Noted  Positive ANA (antinuclear antibody) 04/09/2021   Pain in right knee 04/09/2021   Hypertensive urgency 03/29/2021   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   Abnormal EKG 03/29/2021   Chronic night sweats 03/01/2021   Erectile dysfunction 01/28/2021   Type 2 diabetes mellitus ([place]) 01/28/2021   Hypertension 01/28/2021   Hyperlipidemia 01/28/2021   Chest pain 08/31/2020   Angioedema 08/31/2020   AKI (acute kidney injury) ([place]) 08/31/2020   Gout  09/26/2013    Past Medical History:  Diagnosis Date   Acid reflux    Asthma    As a child   Diabetes mellitus without complication ([place])    Gout    High cholesterol    Hypertension     Family History  Problem Relation Age of Onset   Diabetes Mother    Diabetes Father    Diabetes Sister    Diabetes Sister    Diabetes Brother    Parkinson's disease Brother    Past Surgical History:  Procedure Laterality Date   LEFT HEART CATH AND CORONARY ANGIOGRAPHY N/A 08/31/2020   Procedure: LEFT HEART CATH AND CORONARY ANGIOGRAPHY;  Surgeon: Sherren Mocha, MD;  Location: [facility];  Service: Cardiovascular;  Laterality: N/A;   Social History   Social History Narrative   Not on file   Immunization History  Administered Date(s) Administered   Pneumococcal Polysaccharide-23 09/26/2013   Td 02/11/2012     Objective: Vital Signs: BP 121/83 (BP Location: Left Arm, Patient Position: Sitting, Cuff Size: Normal)    Pulse 93    Resp 15    Ht '5\' 9"'  (1.753 m)    Wt 165 lb (74.8 kg)    BMI 24.37 kg/m    Physical Exam Cardiovascular:     Rate and Rhythm: Normal rate and regular rhythm.  Pulmonary:     Effort: Pulmonary effort is normal.     Breath sounds: Normal breath sounds.  Skin:    General: Skin is warm and dry.     Findings: No rash.  Neurological:     Mental Status: He is alert.  Psychiatric:        Mood and Affect: Mood normal.     Musculoskeletal Exam:  Neck full ROM no tenderness Shoulders full ROM no tenderness or swelling Elbows full ROM no tenderness or swelling Wrists full ROM no tenderness or swelling Fingers full ROM no tenderness or swelling Knees full ROM right knee patellofemoral crepitus present mild tenderness to pressure on patellar tendon no palpable effusions Ankles full ROM no tenderness or swelling   Investigation: No additional findings.  Imaging: DG Chest 2 View  Result Date: 03/29/2021 CLINICAL DATA:  Chest tightness for 5 days. EXAM:  CHEST - 2 VIEW COMPARISON:  12/31/2018 FINDINGS: Cardiac silhouette and mediastinal contours are within normal limits. The lungs are clear. No pleural effusion or pneumothorax. Minimal multilevel degenerative disc changes of the midthoracic spine. IMPRESSION: No active cardiopulmonary disease. Electronically Signed   By: Yvonne Kendall M.D.   On: 03/29/2021 17:15    Recent Labs: Lab Results  Component Value Date   WBC 7.5 03/29/2021   HGB 13.2 03/29/2021   PLT 265 03/29/2021   NA 135 03/29/2021   K 4.1 03/29/2021   CL 102 03/29/2021   CO2 26 03/29/2021   GLUCOSE 178 (H) 03/29/2021   BUN 18 03/29/2021   CREATININE 1.20 03/29/2021   BILITOT 0.5 01/28/2021   ALKPHOS 85 01/28/2021   AST 23 01/28/2021   ALT 21  01/28/2021   PROT 6.9 01/28/2021   ALBUMIN 4.5 01/28/2021   CALCIUM 9.0 03/29/2021   GFRAA >60 05/30/2019    Speciality Comments: No specialty comments available.  Procedures:  No procedures performed Allergies: Ace inhibitors, Cat hair extract, Losartan, Shellfish allergy, and Lisinopril   Assessment / Plan:     Visit Diagnoses: Chronic night sweats - Plan: Sedimentation rate, Serum protein electrophoresis with reflex  No obvious underlying cause from exam or history and review of previous labs checked.  We will repeat the sedimentation rate test also checking serum protein electrophoresis.  Positive ANA (antinuclear antibody) - Plan: Sedimentation rate, Anti-Smith antibody, Sjogrens syndrome-A extractable nuclear antibody, Anti-DNA antibody, double-stranded  Previous positive ANA more recently negative on repeat.  We will check limited set of more specific antibody markers today including dsDNA Carmin [place]..  Idiopathic chronic gout of multiple sites without tophus - Plan: Uric acid  Chronic history of gout attacks are pretty infrequent 1 or 2 times per year typically do not know there is strong indication for urate lowering therapy.  We will repeat uric acid level today  while he is not having symptoms.  Chronic pain of right knee  The right knee symptoms are more concerning to me for osteoarthritis and effects of his lateral meniscus tear noted on MRI than gout.  MSU crystals are more likely incidental considering at the time he had bilateral knee effusions and the lack of elevated white blood cell count on synovial fluid analysis.  Discussed options recommended conservative approaches with use of NSAIDs upper leg strength and flexibility exercises, or supportive knee brace or wrap.  Discussed option to see him back if the inflammatory symptoms return to take a better look.  Orders: Orders Placed This Encounter  Procedures   Sedimentation rate   Uric acid   Anti-Smith antibody   Sjogrens syndrome-A extractable nuclear antibody   Anti-DNA antibody, double-stranded   Serum protein electrophoresis with reflex   No orders of the defined types were placed in this encounter.    Follow-Up Instructions: Return in about 22 days (around 05/01/2021) for New pt sweats/knee pain/?gout f/u 3 wks.   Collier Salina, MD  Note - This record has been created using [employer].  Chart creation errors have been sought, but may not always  have been located. Such creation errors do not reflect on  the standard of medical care.

## 2021-04-09 ENCOUNTER — Encounter: Payer: Self-pay | Admitting: Internal Medicine

## 2021-04-09 ENCOUNTER — Ambulatory Visit (INDEPENDENT_AMBULATORY_CARE_PROVIDER_SITE_OTHER): Payer: 59 | Admitting: Internal Medicine

## 2021-04-09 ENCOUNTER — Other Ambulatory Visit: Payer: Self-pay

## 2021-04-09 VITALS — BP 121/83 | HR 93 | Resp 15 | Ht 69.0 in | Wt 165.0 lb

## 2021-04-09 DIAGNOSIS — G8929 Other chronic pain: Secondary | ICD-10-CM

## 2021-04-09 DIAGNOSIS — M1A09X0 Idiopathic chronic gout, multiple sites, without tophus (tophi): Secondary | ICD-10-CM | POA: Diagnosis not present

## 2021-04-09 DIAGNOSIS — M25561 Pain in right knee: Secondary | ICD-10-CM | POA: Insufficient documentation

## 2021-04-09 DIAGNOSIS — R61 Generalized hyperhidrosis: Secondary | ICD-10-CM | POA: Diagnosis not present

## 2021-04-09 DIAGNOSIS — R768 Other specified abnormal immunological findings in serum: Secondary | ICD-10-CM | POA: Insufficient documentation

## 2021-04-09 NOTE — Patient Instructions (Addendum)
I suspect your knee symptoms are coming from structural causes with a lateral meniscus tear and osteoarthritis in that joint. I am attaching some information that includes some of the treatments we discussed to review.  I am checking several tests to further evaluate your chronic night sweats and abnormal lab results.  If these are negative we do not need scheduled follow up but can take another look as needed if your symptom such as joint swelling flare up.

## 2021-04-15 LAB — SJOGRENS SYNDROME-A EXTRACTABLE NUCLEAR ANTIBODY: SSA (Ro) (ENA) Antibody, IgG: <1 AI

## 2021-04-15 LAB — ANTI-DNA ANTIBODY, DOUBLE-STRANDED: ds DNA Ab: <1 IU/mL

## 2021-04-15 LAB — ANTI-SMITH ANTIBODY: ENA SM Ab Ser-aCnc: <1 AI

## 2021-04-15 LAB — PROTEIN ELECTROPHORESIS, SERUM, WITH REFLEX
Albumin ELP: 4 g/dL (ref 3.8–4.8)
Alpha 1: 0.3 g/dL (ref 0.2–0.3)
Alpha 2: 1 g/dL — ABNORMAL HIGH (ref 0.5–0.9)
Beta 2: 0.4 g/dL (ref 0.2–0.5)
Beta Globulin: 0.4 g/dL (ref 0.4–0.6)
Gamma Globulin: 1 g/dL (ref 0.8–1.7)
Total Protein: 7.2 g/dL (ref 6.1–8.1)

## 2021-04-15 LAB — URIC ACID: Uric Acid, Serum: 9.6 mg/dL — ABNORMAL HIGH (ref 4.0–8.0)

## 2021-04-15 LAB — SEDIMENTATION RATE: Sed Rate: 22 mm/h — ABNORMAL HIGH (ref 0–20)

## 2021-04-16 NOTE — Progress Notes (Deleted)
? ?Office Visit Note ? ?Patient: Patrick Hawkins             ?Date of Birth: 1960-11-19           ?MRN: [id number]             ?PCP: Rick Duff, MD ?Referring: Rick Duff, MD ?Visit Date: 04/17/2021 ? ? ?Subjective:  ?No chief complaint on file. ? ? ?History of Present Illness: Patrick Hawkins is a 61 y.o. male here for follow up with knee and foot arthritis and gout history. Labs were mostly unremarkable at initial visit besides hyperuricemia 9.6 higher than previous 7.5. ***  ? ?Previous HPI ?04/09/21 ?Patrick Hawkins is a 61 y.o. male here for evaluation of gouty arthritis with knee and foot pain and also concern for positive ANA with chronic night sweats. He had inflammatory joint pain most commonly in the feet and toes attributed to gout with episodic flares for years.  He was never on any long-term urate lowering treatment.  Last year he developed bilateral knee pain and swelling worse on the right side that persisted for about 2 months total duration.  He had aspiration of the right knee twice with local steroid injection before this resolved.  Fluid at that time showed minimal white blood cells and extracellular monosodium urate crystals.  He had a positive ANA test at low titer.  Since then he had at least 1 more episode of significant right knee pain and swelling with some persistence.  Repeat labs including a negative ANA earlier this year.  He does not have a lot of joint pain but notices some discomfort and when driving long distances or in fixed positions will become unable to fully extend the knee. He does get benefit with NSAIDs as needed. ?Symptoms are ongoing during the past 10 years suspected causes also including alcohol use or diabetes workup was unrevealing of chronic infections.  He has fairly drenching night sweats we will often change clothing up to 3 times per night.  This has been going on for several years now.  He reports social alcohol drinking estimates he drinks about 2  nights per week.  He has observed less night sweats and awakening when he drinks alcohol but feels worse in the mornings anyway.  He does not experience any sweating during daytime or other ill effects during 3 or 4 days at a time in between drinking and has never had known withdrawal symptoms.  He denies any unintentional weight loss, lymphadenopathy, fevers, skin rashes, cough, or leg swelling. ?  ?Labs reviewed ?03/2021 ?ANA neg ?Uric acid 7.9 ?  ?05/2020 ?ANA 1:40 speckled ?RF neg ?ESR 17 ?CRP 3.9 ?Synovial fluid extracellular MSU 281 WBCs ? ? ?No Rheumatology ROS completed.  ? ?PMFS History:  ?Patient Active Problem List  ? Diagnosis Date Noted  ? Positive ANA (antinuclear antibody) 04/09/2021  ? Pain in right knee 04/09/2021  ? Hypertensive urgency 03/29/2021  ? 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  ? Abnormal EKG 03/29/2021  ? Chronic night sweats 03/01/2021  ? Erectile dysfunction 01/28/2021  ? Type 2 diabetes mellitus ([place]) 01/28/2021  ? Hypertension 01/28/2021  ? Hyperlipidemia 01/28/2021  ? Chest pain 08/31/2020  ? Angioedema 08/31/2020  ? AKI (acute kidney injury) ([place]) 08/31/2020  ? Gout 09/26/2013  ?  ?Past Medical History:  ?Diagnosis Date  ? Acid reflux   ? Asthma   ? As a child  ? Diabetes mellitus without complication ([place] [place])   ?  Gout   ? High cholesterol   ? Hypertension   ?  ?Family History  ?Problem Relation Age of Onset  ? Diabetes Mother   ? Diabetes Father   ? Diabetes Sister   ? Diabetes Sister   ? Diabetes Brother   ? Parkinson's disease Brother   ? ?Past Surgical History:  ?Procedure Laterality Date  ? LEFT HEART CATH AND CORONARY ANGIOGRAPHY N/A 08/31/2020  ? Procedure: LEFT HEART CATH AND CORONARY ANGIOGRAPHY;  Surgeon: Sherren Mocha, MD;  Location: [facility];  Service: Cardiovascular;  Laterality: N/A;  ? ?Social History  ? ?Social History Narrative  ? Not on file  ? ?Immunization History  ?Administered Date(s) Administered  ? Pneumococcal Polysaccharide-23 09/26/2013   ? Td 02/11/2012  ?  ? ?Objective: ?Vital Signs: There were no vitals taken for this visit.  ? ?Physical Exam  ? ?Musculoskeletal Exam: *** ? ?CDAI Exam: ?CDAI Score: -- ?Patient Global: --; Provider Global: -- ?Swollen: --; Tender: -- ?Joint Exam 04/17/2021  ? ?No joint exam has been documented for this visit  ? ?There is currently no information documented on the homunculus. Go to the Rheumatology activity and complete the homunculus joint exam. ? ?Investigation: ?No additional findings. ? ?Imaging: ?DG Chest 2 View ? ?Result Date: 03/29/2021 ?CLINICAL DATA:  Chest tightness for 5 days. EXAM: CHEST - 2 VIEW COMPARISON:  12/31/2018 FINDINGS: Cardiac silhouette and mediastinal contours are within normal limits. The lungs are clear. No pleural effusion or pneumothorax. Minimal multilevel degenerative disc changes of the midthoracic spine. IMPRESSION: No active cardiopulmonary disease. Electronically Signed   By: Yvonne Kendall M.D.   On: 03/29/2021 17:15   ? ?Recent Labs: ?Lab Results  ?Component Value Date  ? WBC 7.5 03/29/2021  ? HGB 13.2 03/29/2021  ? PLT 265 03/29/2021  ? NA 135 03/29/2021  ? K 4.1 03/29/2021  ? CL 102 03/29/2021  ? CO2 26 03/29/2021  ? GLUCOSE 178 (H) 03/29/2021  ? BUN 18 03/29/2021  ? CREATININE 1.20 03/29/2021  ? BILITOT 0.5 01/28/2021  ? ALKPHOS 85 01/28/2021  ? AST 23 01/28/2021  ? ALT 21 01/28/2021  ? PROT 7.2 04/09/2021  ? ALBUMIN 4.5 01/28/2021  ? CALCIUM 9.0 03/29/2021  ? GFRAA >60 05/30/2019  ? ? ?Speciality Comments: No specialty comments available. ? ?Procedures:  ?No procedures performed ?Allergies: Ace inhibitors, Cat hair extract, Losartan, Shellfish allergy, and Lisinopril  ? ?Assessment / Plan:     ?Visit Diagnoses: No diagnosis found. ? ?*** ? ?Orders: ?No orders of the defined types were placed in this encounter. ? ?No orders of the defined types were placed in this encounter. ? ? ? ?Follow-Up Instructions: No follow-ups on file. ? ? ?Collier Salina, MD ? ?Note - This record  has been created using [employer].  ?Chart creation errors have been sought, but may not always  ?have been located. Such creation errors do not reflect on  ?the standard of medical care. ? ?

## 2021-04-17 ENCOUNTER — Encounter: Payer: Self-pay | Admitting: Internal Medicine

## 2021-04-17 ENCOUNTER — Ambulatory Visit: Payer: 59 | Admitting: Internal Medicine

## 2021-04-17 ENCOUNTER — Ambulatory Visit (INDEPENDENT_AMBULATORY_CARE_PROVIDER_SITE_OTHER): Payer: 59 | Admitting: Internal Medicine

## 2021-04-17 ENCOUNTER — Other Ambulatory Visit: Payer: Self-pay

## 2021-04-17 VITALS — BP 153/80 | HR 81 | Resp 15 | Ht 69.75 in | Wt 170.0 lb

## 2021-04-17 DIAGNOSIS — R61 Generalized hyperhidrosis: Secondary | ICD-10-CM

## 2021-04-17 DIAGNOSIS — M1A09X0 Idiopathic chronic gout, multiple sites, without tophus (tophi): Secondary | ICD-10-CM | POA: Diagnosis not present

## 2021-04-17 MED ORDER — ALLOPURINOL 300 MG PO TABS: 300.0000 mg | ORAL_TABLET | Freq: Every day | ORAL | 1 refills | Status: DC

## 2021-04-17 NOTE — Progress Notes (Signed)
Office Visit Note  Patient: Patrick Hawkins             Date of Birth: November 25, 1960           MRN: [id number]             PCP: Marolyn Haller, MD Referring: Marolyn Haller, MD Visit Date: 04/17/2021   Subjective:  Follow-up (Doing good)   History of Present Illness: Patrick Hawkins is a 61 y.o. male here for follow up of persistent drenching night sweats and right knee pain and gouty arthritis. Labs at initial visit showing uric acid increased at 9.6 up from 7.5. His right knee pain is slightly better than before less stiffness no new flare ups.   Review of Systems  Constitutional:  Positive for night sweats. Negative for fatigue.  HENT:  Negative for mouth dryness.   Eyes:  Negative for dryness.  Respiratory:  Negative for shortness of breath.   Cardiovascular:  Negative for swelling in legs/feet.  Gastrointestinal:  Negative for constipation.  Endocrine: Negative for excessive thirst.  Genitourinary:  Negative for difficulty urinating.  Musculoskeletal:  Positive for joint pain and joint pain.  Skin:  Negative for rash.  Allergic/Immunologic: Negative for susceptible to infections.  Neurological:  Positive for night sweats.  Hematological:  Negative for bruising/bleeding tendency.  Psychiatric/Behavioral:  Positive for sleep disturbance.    PMFS History:  Patient Active Problem List   Diagnosis Date Noted   Positive ANA (antinuclear antibody) 04/09/2021   Pain in right knee 04/09/2021   Hypertensive urgency 03/29/2021   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   Abnormal EKG 03/29/2021   Chronic night sweats 03/01/2021   Erectile dysfunction 01/28/2021   Type 2 diabetes mellitus (HCC) 01/28/2021   Hypertension 01/28/2021   Hyperlipidemia 01/28/2021   Chest pain 08/31/2020   Angioedema 08/31/2020   AKI (acute kidney injury) ([facility]) 08/31/2020   Gout 09/26/2013    Past Medical History:  Diagnosis Date   Acid reflux    Asthma    As a child    Diabetes mellitus without complication (HCC)    Gout    High cholesterol    Hypertension     Family History  Problem Relation Age of Onset   Diabetes Mother    Diabetes Father    Diabetes Sister    Diabetes Sister    Diabetes Brother    Parkinson's disease Brother    Past Surgical History:  Procedure Laterality Date   LEFT HEART CATH AND CORONARY ANGIOGRAPHY N/A 08/31/2020   Procedure: LEFT HEART CATH AND CORONARY ANGIOGRAPHY;  Surgeon: Tonny Bollman, MD;  Location: [facility];  Service: Cardiovascular;  Laterality: N/A;   Social History   Social History Narrative   Not on file   Immunization History  Administered Date(s) Administered   Pneumococcal Polysaccharide-23 09/26/2013   Td 02/11/2012     Objective: Vital Signs: BP (!) 153/80 (BP Location: Left Arm, Patient Position: Sitting, Cuff Size: Normal)   Pulse 81   Resp 15   Ht 5' 9.75" (1.772 m)   Wt 170 lb (77.1 kg)   BMI 24.57 kg/m    Physical Exam Cardiovascular:     Rate and Rhythm: Normal rate and regular rhythm.  Pulmonary:     Effort: Pulmonary effort is normal.     Breath sounds: Normal breath sounds.  Lymphadenopathy:     Cervical: No cervical adenopathy.  Skin:    General: Skin is warm and dry.  Findings: No rash.  Neurological:     Mental Status: He is alert.      Musculoskeletal Exam:  Elbows full ROM no tenderness or swelling Wrists full ROM no tenderness or swelling Fingers full ROM no tenderness or swelling Knees full ROM right knee patellofemoral crepitus present, no palpable effusions on either side Ankles full ROM no tenderness or swelling  Investigation: No additional findings.  Imaging: DG Chest 2 View  Result Date: 03/29/2021 CLINICAL DATA:  Chest tightness for 5 days. EXAM: CHEST - 2 VIEW COMPARISON:  12/31/2018 FINDINGS: Cardiac silhouette and mediastinal contours are within normal limits. The lungs are clear. No pleural effusion or pneumothorax. Minimal  multilevel degenerative disc changes of the midthoracic spine. IMPRESSION: No active cardiopulmonary disease. Electronically Signed   By: Neita Garnet M.D.   On: 03/29/2021 17:15    Recent Labs: Lab Results  Component Value Date   WBC 7.5 03/29/2021   HGB 13.2 03/29/2021   PLT 265 03/29/2021   NA 135 03/29/2021   K 4.1 03/29/2021   CL 102 03/29/2021   CO2 26 03/29/2021   GLUCOSE 178 (H) 03/29/2021   BUN 18 03/29/2021   CREATININE 1.20 03/29/2021   BILITOT 0.5 01/28/2021   ALKPHOS 85 01/28/2021   AST 23 01/28/2021   ALT 21 01/28/2021   PROT 7.2 04/09/2021   ALBUMIN 4.5 01/28/2021   CALCIUM 9.0 03/29/2021   GFRAA >60 05/30/2019    Speciality Comments: No specialty comments available.  Procedures:  No procedures performed Allergies: Ace inhibitors, Cat hair extract, Losartan, Shellfish allergy, and Lisinopril   Assessment / Plan:     Visit Diagnoses: Idiopathic chronic gout of multiple sites without tophus - Plan: allopurinol (ZYLOPRIM) 300 MG tablet  Initially suspected gout probably secondary to osteoarthritis and meniscal injury as contributors to the knee swelling.  However with intracellular crystals and uric acid greater than 9 and recent flareup I think it is best to start on urate lowering therapy and at least monitor if this prevents recurrence of joint pain and effusions.  If another episode of severe inflammation returns could try to examine him at that time since I have not seen him during acute episode.  Do not currently see any clear explanation for the night sweats symptoms.  Orders: No orders of the defined types were placed in this encounter.  Meds ordered this encounter  Medications   allopurinol (ZYLOPRIM) 300 MG tablet    Sig: Take 1 tablet (300 mg total) by mouth daily.    Dispense:  90 tablet    Refill:  1    Follow-Up Instructions: Return in about 6 months (around 10/18/2021) for Gout allopurinol f/u 40mos.   Fuller Plan, MD  Note - This  record has been created using [employer].  Chart creation errors have been sought, but may not always  have been located. Such creation errors do not reflect on  the standard of medical care.

## 2021-05-01 ENCOUNTER — Ambulatory Visit: Payer: 59 | Admitting: Internal Medicine

## 2021-05-14 ENCOUNTER — Ambulatory Visit: Payer: 59 | Admitting: Cardiovascular Disease

## 2021-05-30 ENCOUNTER — Encounter: Payer: 59 | Admitting: Student

## 2021-06-01 ENCOUNTER — Emergency Department
Admission: EM | Admit: 2021-06-01 | Discharge: 2021-06-02 | Disposition: A | Discharge status: Home / Self Care | Payer: 59 | Attending: Emergency Medicine | Admitting: Emergency Medicine

## 2021-06-01 ENCOUNTER — Other Ambulatory Visit: Payer: Self-pay

## 2021-06-01 ENCOUNTER — Emergency Department: Payer: 59

## 2021-06-01 DIAGNOSIS — I10 Essential (primary) hypertension: Secondary | ICD-10-CM | POA: Insufficient documentation

## 2021-06-01 DIAGNOSIS — S8991XA Unspecified injury of right lower leg, initial encounter: Secondary | ICD-10-CM | POA: Diagnosis present

## 2021-06-01 DIAGNOSIS — J45909 Unspecified asthma, uncomplicated: Secondary | ICD-10-CM | POA: Diagnosis not present

## 2021-06-01 DIAGNOSIS — Z955 Presence of coronary angioplasty implant and graft: Secondary | ICD-10-CM | POA: Diagnosis not present

## 2021-06-01 DIAGNOSIS — E119 Type 2 diabetes mellitus without complications: Secondary | ICD-10-CM | POA: Insufficient documentation

## 2021-06-01 DIAGNOSIS — R079 Chest pain, unspecified: Secondary | ICD-10-CM | POA: Insufficient documentation

## 2021-06-01 DIAGNOSIS — S8001XA Contusion of right knee, initial encounter: Secondary | ICD-10-CM | POA: Insufficient documentation

## 2021-06-01 MED ORDER — ACETAMINOPHEN 500 MG PO TABS
1000.0000 mg | ORAL_TABLET | Freq: Once | ORAL | Status: AC
  Administered 2021-06-02: 1000 mg via ORAL
  Filled 2021-06-01: qty 2
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
## 2021-06-02 ENCOUNTER — Emergency Department: Payer: 59

## 2021-06-02 LAB — CBC WITH DIFFERENTIAL/PLATELET
Abs Immature Granulocytes: 0.02 10*3/uL (ref 0.00–0.07)
Basophils Absolute: 0.1 10*3/uL (ref 0.0–0.1)
Basophils Relative: 1 %
Eosinophils Absolute: 0.1 10*3/uL (ref 0.0–0.5)
Eosinophils Relative: 1 %
HCT: 38 % — ABNORMAL LOW (ref 39.0–52.0)
Hemoglobin: 12.3 g/dL — ABNORMAL LOW (ref 13.0–17.0)
Immature Granulocytes: 0 %
Lymphocytes Relative: 29 %
Lymphs Abs: 2 10*3/uL (ref 0.7–4.0)
MCH: 27.6 pg (ref 26.0–34.0)
MCHC: 32.4 g/dL (ref 30.0–36.0)
MCV: 85.2 fL (ref 80.0–100.0)
Monocytes Absolute: 0.6 10*3/uL (ref 0.1–1.0)
Monocytes Relative: 8 %
Neutro Abs: 4.1 10*3/uL (ref 1.7–7.7)
Neutrophils Relative %: 61 %
Platelets: 324 10*3/uL (ref 150–400)
RBC: 4.46 MIL/uL (ref 4.22–5.81)
RDW: 13.2 % (ref 11.5–15.5)
WBC: 6.9 10*3/uL (ref 4.0–10.5)
nRBC: 0 % (ref 0.0–0.2)

## 2021-06-02 LAB — BASIC METABOLIC PANEL
Anion gap: 12 (ref 5–15)
BUN: 23 mg/dL (ref 8–23)
CO2: 24 mmol/L (ref 22–32)
Calcium: 8.9 mg/dL (ref 8.9–10.3)
Chloride: 102 mmol/L (ref 98–111)
Creatinine, Ser: 1.16 mg/dL (ref 0.61–1.24)
GFR, Estimated: >60 mL/min (ref >60)
Glucose, Bld: 197 mg/dL — ABNORMAL HIGH (ref 70–99)
Potassium: 3.4 mmol/L — ABNORMAL LOW (ref 3.5–5.1)
Sodium: 138 mmol/L (ref 135–145)

## 2021-06-02 LAB — TROPONIN I (HIGH SENSITIVITY)
Troponin I (High Sensitivity): 10 ng/L (ref <18)
Troponin I (High Sensitivity): 8 ng/L (ref <18)

## 2021-06-02 MED ORDER — ASPIRIN 81 MG PO CHEW
324.0000 mg | CHEWABLE_TABLET | Freq: Once | ORAL | Status: AC
  Administered 2021-06-02: 324 mg via ORAL
  Filled 2021-06-02: qty 4

## 2021-06-02 MED ORDER — NITROGLYCERIN 0.4 MG SL SUBL
0.4000 mg | SUBLINGUAL_TABLET | SUBLINGUAL | Status: DC | PRN
  Administered 2021-06-02: 0.4 mg via SUBLINGUAL
  Filled 2021-06-02: qty 1
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
## 2021-06-02 NOTE — ED Notes (Signed)
Patient complaining of chest pain, MD notified and EKG ordered.

## 2021-06-02 NOTE — ED Notes (Signed)
EKG showing changes, patient moved to rm 26 and report given to [place], Charity fundraiser. ?

## 2021-06-02 NOTE — Discharge Instructions (Addendum)

## 2021-06-14 ENCOUNTER — Encounter: Payer: Self-pay | Admitting: Urology

## 2021-06-14 ENCOUNTER — Ambulatory Visit: Payer: 59 | Admitting: Urology

## 2021-06-14 NOTE — Progress Notes (Incomplete)
06/14/21 ?5:52 AM  ? ?Patrick Hawkins ?03-30-1960 ?[id number] ? ?Referring provider:  ?Marolyn Haller, MD ?[address] ?[place],  [place] [address] ?No chief complaint on file. ? ? ?Urological history  ? ? ? ?HPI: ?Patrick Hawkins is a 61 y.o.male ? ? ? ? ? ?PMH: ?Past Medical History:  ?Diagnosis Date  ? Acid reflux   ? Asthma   ? As a child  ? Diabetes mellitus without complication (HCC)   ? Gout   ? High cholesterol   ? Hypertension   ? ? ?Surgical History: ?Past Surgical History:  ?Procedure Laterality Date  ? LEFT HEART CATH AND CORONARY ANGIOGRAPHY N/A 08/31/2020  ? Procedure: LEFT HEART CATH AND CORONARY ANGIOGRAPHY;  Surgeon: Tonny Bollman, MD;  Location: [facility] INVASIVE CV LAB;  Service: Cardiovascular;  Laterality: N/A;  ? ? ?Home Medications:  ?Allergies as of 06/14/2021   ? ?   Reactions  ? Ace Inhibitors Swelling  ? Angioedema 08/31/2020  ? Cat Hair Extract Shortness Of Breath  ? Losartan Swelling  ? Shellfish Allergy Anaphylaxis, Swelling  ? Other reaction(s): Angioedema  ? Lisinopril   ? ?  ? ?  ?Medication List  ?  ? ?  ? Accurate as of Jun 14, 2021  5:52 AM. If you have any questions, ask your nurse or doctor.  ?  ?  ? ?  ? ?albuterol 108 (90 Base) MCG/ACT inhaler ?Commonly known as: VENTOLIN HFA ?Inhale 2 puffs into the lungs every 6 (six) hours as needed for wheezing or shortness of breath. ?  ?allopurinol 300 MG tablet ?Commonly known as: ZYLOPRIM ?Take 1 tablet (300 mg total) by mouth daily. ?  ?amLODipine 5 MG tablet ?Commonly known as: NORVASC ?Take 1 tablet (5 mg total) by mouth daily for 180 doses. ?  ?aspirin 81 MG EC tablet ?Take 1 tablet (81 mg total) by mouth daily. Swallow whole. ?  ?atorvastatin 10 MG tablet ?Commonly known as: LIPITOR ?Take 1 tablet (10 mg total) by mouth daily. ?  ?diphenhydrAMINE 25 MG tablet ?Commonly known as: BENADRYL ?Take 25 mg by mouth every 6 (six) hours as needed. ?  ?hydrochlorothiazide 25 MG tablet ?Commonly known as: HYDRODIURIL ?Take 1 tablet (25 mg total)  by mouth daily. ?  ?metFORMIN 1000 MG tablet ?Commonly known as: GLUCOPHAGE ?Take 1 tablet (1,000 mg total) by mouth 2 (two) times daily with a meal. ?  ?tadalafil 10 MG tablet ?Commonly known as: Cialis ?Take 1 tablet (10 mg total) by mouth daily as needed for erectile dysfunction. Home med. ?  ? ?  ? ? ?Allergies:  ?Allergies  ?Allergen Reactions  ? Ace Inhibitors Swelling  ?  Angioedema 08/31/2020  ? Cat Hair Extract Shortness Of Breath  ? Losartan Swelling  ? Shellfish Allergy Anaphylaxis and Swelling  ?  Other reaction(s): Angioedema  ? Lisinopril   ? ? ?Family History: ?Family History  ?Problem Relation Age of Onset  ? Diabetes Mother   ? Diabetes Father   ? Diabetes Sister   ? Diabetes Sister   ? Diabetes Brother   ? Parkinson's disease Brother   ? ? ?Social History:  reports that he has been smoking cigars. He has never used smokeless tobacco. He reports current alcohol use of about 18.0 standard drinks per week. He reports that he does not use drugs. ? ? ?Physical Exam: ?There were no vitals taken for this visit.  ?Constitutional:  Alert and oriented, No acute distress. ?HEENT: [place] AT, moist mucus membranes.  Trachea midline, no masses. ?Cardiovascular: No clubbing, cyanosis, or edema. ?Respiratory: Normal respiratory effort, no increased work of breathing. ?GI: Abdomen is soft, nontender, nondistended, no abdominal masses ?GU: No CVA tenderness ?Lymph: No cervical or inguinal lymphadenopathy. ?Skin: No rashes, bruises or suspicious lesions. ?Neurologic: Grossly intact, no focal deficits, moving all 4 extremities. ?Psychiatric: Normal mood and affect. ? ?Laboratory Data: ? ?Lab Results  ?Component Value Date  ? CREATININE 1.16 06/02/2021  ? ? ? ?Lab Results  ?Component Value Date  ? HGBA1C 8.0 (A) 01/28/2021  ? ? ?Urinalysis ? ? ?Pertinent Imaging: ? ? ?Assessment & Plan:   ? ? ?No follow-ups on file. ? ?[facility] ?[address], [address] ?[place], [place] [address] ?[phone] ? ?I,Kailey Littlejohn,acting as a Neurosurgeon for [employer], PA-C.,have documented all relevant documentation on the behalf of Patrick MCGOWAN, PA-C,as directed by  [facility], PA-C while in the presence of Patrick MCGOWAN, PA-C. ? ?

## 2021-06-18 ENCOUNTER — Other Ambulatory Visit: Payer: Self-pay | Admitting: Internal Medicine

## 2021-06-18 DIAGNOSIS — I10 Essential (primary) hypertension: Secondary | ICD-10-CM

## 2021-06-27 ENCOUNTER — Encounter: Payer: Self-pay | Admitting: *Deleted

## 2021-07-16 ENCOUNTER — Encounter: Payer: 59 | Admitting: Student

## 2021-07-16 ENCOUNTER — Encounter: Payer: Self-pay | Admitting: Student

## 2021-10-16 NOTE — Progress Notes (Deleted)
Office Visit Note  Patient: Patrick Hawkins             Date of Birth: [birth year]           MRN: [id number]             PCP: Marolyn Haller, MD Referring: Marolyn Haller, MD Visit Date: 10/18/2021   Subjective:  No chief complaint on file.   History of Present Illness: Patrick Hawkins is a 61 y.o. male here for follow up ***   Previous HPI 04/17/2021     No Rheumatology ROS completed.   PMFS History:  Patient Active Problem List   Diagnosis Date Noted   Positive ANA (antinuclear antibody) 04/09/2021   Pain in right knee 04/09/2021   Hypertensive urgency 03/29/2021   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   Abnormal EKG 03/29/2021   Chronic night sweats 03/01/2021   Erectile dysfunction 01/28/2021   Type 2 diabetes mellitus (HCC) 01/28/2021   Hypertension 01/28/2021   Hyperlipidemia 01/28/2021   Chest pain 08/31/2020   Angioedema 08/31/2020   AKI (acute kidney injury) (HCC) 08/31/2020   Gout 09/26/2013    Past Medical History:  Diagnosis Date   Acid reflux    Asthma    As a child   Diabetes mellitus without complication (HCC)    Gout    High cholesterol    Hypertension     Family History  Problem Relation Age of Onset   Diabetes Mother    Diabetes Father    Diabetes Sister    Diabetes Sister    Diabetes Brother    Parkinson's disease Brother    Past Surgical History:  Procedure Laterality Date   LEFT HEART CATH AND CORONARY ANGIOGRAPHY N/A 08/31/2020   Procedure: LEFT HEART CATH AND CORONARY ANGIOGRAPHY;  Surgeon: Tonny Bollman, MD;  Location: [facility] INVASIVE CV LAB;  Service: Cardiovascular;  Laterality: N/A;   Social History   Social History Narrative   Not on file   Immunization History  Administered Date(s) Administered   Pneumococcal Polysaccharide-23 09/26/2013   Td 02/11/2012     Objective: Vital Signs: There were no vitals taken for this visit.   Physical Exam   Musculoskeletal Exam: ***  CDAI Exam: CDAI Score:  -- Patient Global: --; Provider Global: -- Swollen: --; Tender: -- Joint Exam 10/18/2021   No joint exam has been documented for this visit   There is currently no information documented on the homunculus. Go to the Rheumatology activity and complete the homunculus joint exam.  Investigation: No additional findings.  Imaging: No results found.  Recent Labs: Lab Results  Component Value Date   WBC 6.9 06/02/2021   HGB 12.3 (L) 06/02/2021   PLT 324 06/02/2021   NA 138 06/02/2021   K 3.4 (L) 06/02/2021   CL 102 06/02/2021   CO2 24 06/02/2021   GLUCOSE 197 (H) 06/02/2021   BUN 23 06/02/2021   CREATININE 1.16 06/02/2021   BILITOT 0.5 01/28/2021   ALKPHOS 85 01/28/2021   AST 23 01/28/2021   ALT 21 01/28/2021   PROT 7.2 04/09/2021   ALBUMIN 4.5 01/28/2021   CALCIUM 8.9 06/02/2021   GFRAA >60 05/30/2019    Speciality Comments: No specialty comments available.  Procedures:  No procedures performed Allergies: Ace inhibitors, Cat hair extract, Losartan, Shellfish allergy, and Lisinopril   Assessment / Plan:     Visit Diagnoses: No diagnosis found.  ***  Orders: No orders of the defined types were placed in  this encounter.  No orders of the defined types were placed in this encounter.    Follow-Up Instructions: No follow-ups on file.   Jairo Ben, RT  Note - This record has been created using Animal nutritionist.  Chart creation errors have been sought, but may not always  have been located. Such creation errors do not reflect on  the standard of medical care.

## 2021-10-18 ENCOUNTER — Ambulatory Visit: Payer: 59 | Attending: Internal Medicine | Admitting: Internal Medicine

## 2021-10-18 DIAGNOSIS — R61 Generalized hyperhidrosis: Secondary | ICD-10-CM

## 2021-10-18 DIAGNOSIS — M1A09X0 Idiopathic chronic gout, multiple sites, without tophus (tophi): Secondary | ICD-10-CM

## 2021-11-04 ENCOUNTER — Other Ambulatory Visit: Payer: Self-pay | Admitting: Internal Medicine

## 2021-11-04 DIAGNOSIS — N529 Male erectile dysfunction, unspecified: Secondary | ICD-10-CM

## 2021-12-01 ENCOUNTER — Ambulatory Visit
Admission: EM | Admit: 2021-12-01 | Discharge: 2021-12-01 | Disposition: A | Discharge status: Home / Self Care | Payer: Self-pay | Attending: Emergency Medicine | Admitting: Emergency Medicine

## 2021-12-01 DIAGNOSIS — R0981 Nasal congestion: Secondary | ICD-10-CM

## 2021-12-01 DIAGNOSIS — H1033 Unspecified acute conjunctivitis, bilateral: Secondary | ICD-10-CM

## 2021-12-01 MED ORDER — POLYMYXIN B-TRIMETHOPRIM 10000-0.1 UNIT/ML-% OP SOLN: 1.0000 [drp] | Freq: Four times a day (QID) | OPHTHALMIC | 0 refills | Status: AC

## 2021-12-01 NOTE — ED Triage Notes (Signed)
Patient to Urgent Care with complaints of bilateral eye drainage/ redness/ itching x2 days. Reports waking up with eyes crusted shut.

## 2021-12-01 NOTE — ED Provider Notes (Signed)
[name]    CSN: [id number] Arrival date & time: 12/01/21  1249      History   Chief Complaint Chief Complaint  Patient presents with   Eye Drainage    HPI Patrick Hawkins is a 61 y.o. male.  Patient presents with 2-day history of bilateral eye redness, itching, yellow-green drainage, crusting in lashes.  Symptoms are getting worse; eyelids crusted together this morning.  No eye trauma, eye pain, changes in vision.  No treatments at home.  Patient also reports nasal congestion today.  Treatment at home with nasal spray.  He denies fever, rash, sore throat, cough, shortness of breath, vomiting, diarrhea, or other symptoms.  His medical history includes hypertension and diabetes.  The history is provided by the patient and medical records.    Past Medical History:  Diagnosis Date   Acid reflux    Asthma    As a child   Diabetes mellitus without complication (HCC)    Gout    High cholesterol    Hypertension     Patient Active Problem List   Diagnosis Date Noted   Positive ANA (antinuclear antibody) 04/09/2021   Pain in right knee 04/09/2021   Hypertensive urgency 03/29/2021   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   Abnormal EKG 03/29/2021   Chronic night sweats 03/01/2021   Erectile dysfunction 01/28/2021   Type 2 diabetes mellitus (HCC) 01/28/2021   Hypertension 01/28/2021   Hyperlipidemia 01/28/2021   Chest pain 08/31/2020   Angioedema 08/31/2020   AKI (acute kidney injury) (HCC) 08/31/2020   Gout 09/26/2013    Past Surgical History:  Procedure Laterality Date   LEFT HEART CATH AND CORONARY ANGIOGRAPHY N/A 08/31/2020   Procedure: LEFT HEART CATH AND CORONARY ANGIOGRAPHY;  Surgeon: Tonny Bollman, MD;  Location: [facility] INVASIVE CV LAB;  Service: Cardiovascular;  Laterality: N/A;       Home Medications    Prior to Admission medications   Medication Sig Start Date End Date Taking? Authorizing Provider  trimethoprim-polymyxin b  (POLYTRIM) ophthalmic solution Place 1 drop into both eyes 4 (four) times daily for 7 days. 12/01/21 12/08/21 Yes Mickie Bail, NP  albuterol (VENTOLIN HFA) 108 (90 Base) MCG/ACT inhaler Inhale 2 puffs into the lungs every 6 (six) hours as needed for wheezing or shortness of breath. 08/31/20   Clydie Braun, MD  allopurinol (ZYLOPRIM) 300 MG tablet Take 1 tablet (300 mg total) by mouth daily. 04/17/21   Rice, Jamesetta Orleans, MD  amLODipine (NORVASC) 5 MG tablet Take 1 tablet (5 mg total) by mouth daily for 180 doses. 12/05/20 06/03/21  Coralyn Mark, NP  aspirin EC 81 MG EC tablet Take 1 tablet (81 mg total) by mouth daily. Swallow whole. Patient not taking: Reported on 04/09/2021 03/31/21   Darlin Priestly, MD  atorvastatin (LIPITOR) 10 MG tablet Take 1 tablet (10 mg total) by mouth daily. Patient not taking: Reported on 03/30/2021 01/28/21 07/27/21  Gardenia Phlegm, MD  diphenhydrAMINE (BENADRYL) 25 MG tablet Take 25 mg by mouth every 6 (six) hours as needed.    [provider]  hydrochlorothiazide (HYDRODIURIL) 25 MG tablet Take 1 tablet by mouth once daily 06/25/21   Marolyn Haller, MD  metFORMIN (GLUCOPHAGE) 1000 MG tablet Take 1 tablet (1,000 mg total) by mouth 2 (two) times daily with a meal. 03/01/21 11/26/21  Marolyn Haller, MD  tadalafil (CIALIS) 10 MG tablet Take 1 tablet (10 mg total) by mouth daily as needed for erectile dysfunction.  Home med. 03/30/21   Darlin Priestly, MD  traZODone (DESYREL) 50 MG tablet Take 1 tablet (50 mg total) by mouth at bedtime. 05/30/19 03/08/20  LampteyBritta Mccreedy, MD    Family History Family History  Problem Relation Age of Onset   Diabetes Mother    Diabetes Father    Diabetes Sister    Diabetes Sister    Diabetes Brother    Parkinson's disease Brother     Social History Social History   Tobacco Use   Smoking status: Some Days    Types: Cigars   Smokeless tobacco: Never  Vaping Use   Vaping Use: Never used  Substance Use Topics   Alcohol  use: Yes    Alcohol/week: 18.0 standard drinks of alcohol    Types: 18 Cans of beer per week   Drug use: Never     Allergies   Ace inhibitors, Cat hair extract, Losartan, Shellfish allergy, and Lisinopril   Review of Systems Review of Systems  Constitutional:  Negative for chills and fever.  HENT:  Positive for congestion. Negative for ear pain and sore throat.   Eyes:  Positive for discharge, redness and itching. Negative for pain and visual disturbance.  Respiratory:  Negative for cough and shortness of breath.   Gastrointestinal:  Negative for diarrhea and vomiting.  Skin:  Negative for color change and rash.  All other systems reviewed and are negative.    Physical Exam Triage Vital Signs ED Triage Vitals [12/01/21 1326]  Enc Vitals Group     BP (!) 154/80     Pulse Rate 84     Resp 18     Temp 99 F (37.2 C)     Temp Source Oral     SpO2 99 %     Weight 180 lb (81.6 kg)     Height 5\' 10"  (1.778 m)     Head Circumference      Peak Flow      Pain Score 0     Pain Loc      Pain Edu?      Excl. in GC?    No data found.  Updated Vital Signs BP (!) 154/80   Pulse 84   Temp 99 F (37.2 C) (Oral)   Resp 18   Ht 5\' 10"  (1.778 m)   Wt 180 lb (81.6 kg)   SpO2 99%   BMI 25.83 kg/m   Visual Acuity Right Eye Distance:   Left Eye Distance:   Bilateral Distance:    Right Eye Near:   Left Eye Near:    Bilateral Near:     Physical Exam Vitals and nursing note reviewed.  Constitutional:      General: He is not in acute distress.    Appearance: Normal appearance. He is well-developed. He is not ill-appearing.  HENT:     Head: Normocephalic and atraumatic.     Right Ear: Tympanic membrane normal.     Left Ear: Tympanic membrane normal.     Nose: Nose normal.     Mouth/Throat:     Mouth: Mucous membranes are moist.     Pharynx: Oropharynx is clear.  Eyes:     General: Lids are normal. Vision grossly intact.     Extraocular Movements: Extraocular  movements intact.     Conjunctiva/sclera:     Right eye: Right conjunctiva is injected.     Left eye: Left conjunctiva is injected.     Pupils: Pupils are equal, round, and  reactive to light.  Cardiovascular:     Rate and Rhythm: Normal rate and regular rhythm.     Heart sounds: Normal heart sounds.  Pulmonary:     Effort: Pulmonary effort is normal. No respiratory distress.     Breath sounds: Normal breath sounds.  Musculoskeletal:     Cervical back: Neck supple.  Skin:    General: Skin is warm and dry.  Neurological:     Mental Status: He is alert.  Psychiatric:        Mood and Affect: Mood normal.        Behavior: Behavior normal.      UC Treatments / Results  Labs (all labs ordered are listed, but only abnormal results are displayed) Labs Reviewed - No data to display  EKG   Radiology No results found.  Procedures Procedures (including critical care time)  Medications Ordered in UC Medications - No data to display  Initial Impression / Assessment and Plan / UC Course  I have reviewed the triage vital signs and the nursing notes.  Pertinent labs & imaging results that were available during my care of the patient were reviewed by me and considered in my medical decision making (see chart for details).    Bilateral conjunctivitis, nasal congestion.  Treating with Polytrim eyedrops.  Education provided on conjunctivitis.  Instructed patient to follow-up with his PCP if his symptoms are not improving.  ED precautions discussed.  Patient agrees to plan of care.   Final Clinical Impressions(s) / UC Diagnoses   Final diagnoses:  Acute bacterial conjunctivitis of both eyes  Nasal congestion     Discharge Instructions      Use the antibiotic eyedrops as prescribed.    Follow-up with your primary care provider if your symptoms are not improving.    Go to the emergency department if you have acute eye pain, changes in your vision, or other concerning symptoms.         ED Prescriptions     Medication Sig Dispense Auth. Provider   trimethoprim-polymyxin b (POLYTRIM) ophthalmic solution Place 1 drop into both eyes 4 (four) times daily for 7 days. 10 mL Sharion Balloon, NP      PDMP not reviewed this encounter.   Sharion Balloon, NP 12/01/21 1354

## 2021-12-01 NOTE — Discharge Instructions (Addendum)
Use the antibiotic eyedrops as prescribed.    Follow-up with your primary care provider if your symptoms are not improving.    Go to the emergency department if you have acute eye pain, changes in your vision, or other concerning symptoms.    

## 2021-12-10 ENCOUNTER — Telehealth: Payer: Self-pay | Admitting: *Deleted

## 2021-12-10 ENCOUNTER — Other Ambulatory Visit: Payer: Self-pay | Admitting: Student

## 2021-12-10 DIAGNOSIS — E119 Type 2 diabetes mellitus without complications: Secondary | ICD-10-CM

## 2021-12-10 DIAGNOSIS — N529 Male erectile dysfunction, unspecified: Secondary | ICD-10-CM

## 2021-12-10 MED ORDER — SILDENAFIL CITRATE 50 MG PO TABS: 50.0000 mg | ORAL_TABLET | ORAL | 3 refills | Status: DC | PRN

## 2021-12-10 MED ORDER — SILDENAFIL CITRATE 50 MG PO TABS: 50.0000 mg | ORAL_TABLET | ORAL | 1 refills | Status: DC | PRN

## 2021-12-10 NOTE — Telephone Encounter (Signed)
Patient called in requesting refill on Viagra. States Cialis does not work well for him. States ED Provider mistakenly d/c Viagra off med list instead of Cialis.  States that Viagra causes heartburn and wants to know if there is something he can take with it to prevent this.

## 2022-02-20 ENCOUNTER — Encounter: Payer: Self-pay | Admitting: Student

## 2022-04-15 ENCOUNTER — Telehealth: Payer: Self-pay | Admitting: Dietician

## 2022-04-15 NOTE — Telephone Encounter (Signed)
Calling to see if patient would like to continue care at our office . Left voicemail message for return call

## 2022-07-17 ENCOUNTER — Other Ambulatory Visit: Payer: Self-pay | Admitting: Student

## 2022-07-17 DIAGNOSIS — E119 Type 2 diabetes mellitus without complications: Secondary | ICD-10-CM

## 2022-07-17 DIAGNOSIS — I10 Essential (primary) hypertension: Secondary | ICD-10-CM

## 2022-07-26 ENCOUNTER — Other Ambulatory Visit: Payer: Self-pay | Admitting: Student

## 2022-07-26 DIAGNOSIS — I10 Essential (primary) hypertension: Secondary | ICD-10-CM

## 2022-07-26 DIAGNOSIS — E119 Type 2 diabetes mellitus without complications: Secondary | ICD-10-CM

## 2022-08-05 NOTE — Progress Notes (Deleted)
HTN  Will have patient follow up with BP log in 3 months to decide if additional blood pressure medications needed. Also discussed with patient the importance of continuing to cut back on his alcohol use. He is currently drinking about 18 drinks per week. We discussed how this can affect his blood pressure.  -angioedema on  ACE inhibitors -amlodipine 5,hydrochlorothiazide 25  HLD Atorva 10 LDL on 08/2020 95   Gout Uric acid 9.6 03/2021 Allopurinol 300  T2DM A1c 8.0 01/2021 Metformin 1000 BID  [facility]th[facility] HCV Microalbumin colonoscopy

## 2022-08-18 ENCOUNTER — Encounter: Payer: Self-pay | Admitting: Internal Medicine

## 2022-08-18 ENCOUNTER — Ambulatory Visit (INDEPENDENT_AMBULATORY_CARE_PROVIDER_SITE_OTHER): Payer: Self-pay | Admitting: Internal Medicine

## 2022-08-18 VITALS — BP 153/84 | HR 90 | Temp 98.0°F | Wt 165.2 lb

## 2022-08-18 DIAGNOSIS — F4320 Adjustment disorder, unspecified: Secondary | ICD-10-CM

## 2022-08-18 DIAGNOSIS — Z7984 Long term (current) use of oral hypoglycemic drugs: Secondary | ICD-10-CM

## 2022-08-18 DIAGNOSIS — F1090 Alcohol use, unspecified, uncomplicated: Secondary | ICD-10-CM

## 2022-08-18 DIAGNOSIS — Z87891 Personal history of nicotine dependence: Secondary | ICD-10-CM

## 2022-08-18 DIAGNOSIS — E119 Type 2 diabetes mellitus without complications: Secondary | ICD-10-CM

## 2022-08-18 DIAGNOSIS — I10 Essential (primary) hypertension: Secondary | ICD-10-CM

## 2022-08-18 DIAGNOSIS — R61 Generalized hyperhidrosis: Secondary | ICD-10-CM

## 2022-08-18 DIAGNOSIS — R634 Abnormal weight loss: Secondary | ICD-10-CM

## 2022-08-18 DIAGNOSIS — Z789 Other specified health status: Secondary | ICD-10-CM

## 2022-08-18 LAB — POCT GLYCOSYLATED HEMOGLOBIN (HGB A1C): Hemoglobin A1C: 8.3 % — AB (ref 4.0–5.6)

## 2022-08-18 LAB — GLUCOSE, CAPILLARY: Glucose-Capillary: 221 mg/dL — ABNORMAL HIGH (ref 70–99)

## 2022-08-18 MED ORDER — AMLODIPINE BESYLATE 5 MG PO TABS: 5.0000 mg | ORAL_TABLET | Freq: Every day | ORAL | 3 refills | Status: DC

## 2022-08-18 MED ORDER — METFORMIN HCL 1000 MG PO TABS: 1000.0000 mg | ORAL_TABLET | Freq: Two times a day (BID) | ORAL | 0 refills | Status: DC

## 2022-08-18 MED ORDER — HYDROCHLOROTHIAZIDE 25 MG PO TABS: 25.0000 mg | ORAL_TABLET | Freq: Every day | ORAL | 3 refills | Status: DC

## 2022-08-18 NOTE — Progress Notes (Addendum)
Lab Results  Component Value Date   HGBA1C 8.3 (A) 08/18/2022       Subjective:  CC: follow-up on blood pressure and diabetes  HPI:  [name] is a 62 y.o. male with a past medical history stated below and presents today for follow-up on chronic medical conditions. He also continues to have night sweats that have been ongoing for last 10 years. Please see problem based assessment and plan for additional details.  Past Medical History:  Diagnosis Date   Acid reflux    Asthma    As a child   Diabetes mellitus without complication (HCC)    Gout    High cholesterol    Hypertension     Current Outpatient Medications on File Prior to Visit  Medication Sig Dispense Refill   albuterol (VENTOLIN HFA) 108 (90 Base) MCG/ACT inhaler Inhale 2 puffs into the lungs every 6 (six) hours as needed for wheezing or shortness of breath. 8 g 0   allopurinol (ZYLOPRIM) 300 MG tablet Take 1 tablet (300 mg total) by mouth daily. 90 tablet 1   aspirin EC 81 MG EC tablet Take 1 tablet (81 mg total) by mouth daily. Swallow whole. (Patient not taking: Reported on 04/09/2021)     atorvastatin (LIPITOR) 10 MG tablet Take 1 tablet (10 mg total) by mouth daily. (Patient not taking: Reported on 03/30/2021) 180 tablet 1   diphenhydrAMINE (BENADRYL) 25 MG tablet Take 25 mg by mouth every 6 (six) hours as needed.     sildenafil (VIAGRA) 50 MG tablet Take 1 tablet (50 mg total) by mouth as needed for erectile dysfunction. Administer ~1 hour before sexual activity 90 tablet 3   [DISCONTINUED] traZODone (DESYREL) 50 MG tablet Take 1 tablet (50 mg total) by mouth at bedtime. 30 tablet 1   No current facility-administered medications on file prior to visit.    Family History  Problem Relation Age of Onset   Diabetes Mother    Diabetes Father    Diabetes Sister    Diabetes Sister    Diabetes Brother    Parkinson's disease Brother     Social History   Socioeconomic History   Marital status: Single     Spouse name: Not on file   Number of children: Not on file   Years of education: Not on file   Highest education level: Not on file  Occupational History   Not on file  Tobacco Use   Smoking status: Former    Types: Cigars   Smokeless tobacco: Never  Vaping Use   Vaping status: Never Used  Substance and Sexual Activity   Alcohol use: Yes    Alcohol/week: 18.0 standard drinks of alcohol    Types: 18 Cans of beer per week   Drug use: Never   Sexual activity: Yes  Other Topics Concern   Not on file  Social History Narrative   Not on file   Social Determinants of Health   Financial Resource Strain: Not on file  Food Insecurity: Not on file  Transportation Needs: Not on file  Physical Activity: Not on file  Stress: Not on file  Social Connections: Not on file  Intimate Partner Violence: Not on file    Review of Systems: ROS negative except for what is noted on the assessment and plan.  Objective:   Vitals:   08/18/22 1118 08/18/22 1156  BP: (!) 144/88 (!) 153/84  Pulse: 93 90  Temp: 98 F (36.7 C)   TempSrc: Oral  SpO2: 100%   Weight: 165 lb 3.2 oz (74.9 kg)     Physical Exam: Constitutional: well-appearing Neck: no lymphadenopathy Cardiovascular: regular rate and rhythm, no m/r/g Pulmonary/Chest: normal work of breathing on room air, lungs clear to auscultation bilaterally Abdominal: soft, non-tender, non-distended MSK: normal bulk and tone Skin: warm and dry  Assessment & Plan:  Type 2 diabetes mellitus (HCC) Lab Results  Component Value Date   HGBA1C 8.0 (A) 01/28/2021   A1c increased to 8. He does not take metformin regularly because he does not like taking medications. He is prescribed metformin 1000 mg BID. When he takes medication he does not have side effects. I talked with him about risks of elevated blood sugars overtime. P: Refilled Metformin 1000 mg bid, last refill 6/24 He does not like taking medications but is open to taking metformin. He  does feel that his diet could improve but is not interested in going to a dietician at this time.  Repeat A1c in October.  Hypertension Initial 144/88, then increased to 153/84. He was prescribed Hydrochlorothiazide 25 and amlodipine 5 mg but stopped amlodipine as he didn't like taking medications. He did not have side effects. He endorses taking hydrochlorothiazide this AM. Allergies listed for ACE and ARB with facial swelling. P: Unfortunately limited with combination pills. Restart amlodipine 5 mg and continue hydrochlorothiazide 25 mg  Alcohol use Drinks about 3-4 times a week  4-6 drinks. He is not interested in decreasing amount. He does not feel that he has withdraw symptoms. He endorses night sweats when he doesn't drink but these persist even during times that he hasn't drank for >7 days. No history of withdraw sz.  Adjustment disorder    08/18/2022    4:47 PM 01/28/2021    9:10 AM  Depression screen PHQ 2/9  Decreased Interest 0 0  Down, Depressed, Hopeless 1 0  PHQ - 2 Score 1 0  Altered sleeping 2 0  Tired, decreased energy 2 0  Change in appetite 0 0  Feeling bad or failure about yourself  1 0  Trouble concentrating 0 0  Moving slowly or fidgety/restless 0 0  Suicidal thoughts 0 0  PHQ-9 Score 6 0  Difficult doing work/chores Somewhat difficult Not difficult at all      08/18/2022    4:48 PM  GAD 7 : Generalized Anxiety Score  Nervous, Anxious, on Edge 2  Control/stop worrying 2  Worry too much - different things 2  Trouble relaxing 2  Restless 0  Easily annoyed or irritable 2  Afraid - awful might happen 2  Total GAD 7 Score 12  Anxiety Difficulty Somewhat difficult    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 He has lost a lot of money and had trouble keeping business open. He thinks that all of positive screening questions are due to this situation.  Unintentional weight loss He  has lost about 15 lbs in last 10 months unintentionally. He thinks that this is due to stress from work. He endorses decreased appetite and he is eating less in setting of stress. He continues to have night sweats that have been present for the last 10 years. Denies lymphadenopathy. - CMP, CBC w diff, TSH T4  Addendum 7/16: Quantferon gold added, however unable to reach patient. VM left  Chronic night sweats He was evaluated by rheum in setting of positive ANA with night sweats. Further lab  evalution completed with sed rate, SPEP, anti-smith, sjogrens, anti-DNA.  This was negative including repeat ANA.  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 Last CXR completed while symptomatic with night sweats and did not show signs concerning for active TB. HIV negative 1 year ago. 8AM testosterone was >300. He denies cough, diarrhea, vomiting, fevers, chills, or pain. Differentials include hyperthyroidism vs diabetes. He does not have clear review of systems to put to other causes. P: He does not have insurance card with him today. Will place future lab orders and he plans to come back later this week with his card. TSH, free T4 CBC CMP    Patient discussed with Dr. Precious Bard Rheya Minogue, D.O. [facility]  PGY-3 Pager: [phone]  Phone: [phone] Date 08/26/2022  Time 5:14 PM

## 2022-08-18 NOTE — Patient Instructions (Addendum)
Thank you, Patrick Hawkins for allowing [place] to provide your care today.   Weight loss I want to repeat labs for you today, please bring insurance card and come in for lab only visit later this week. It would also be good to make sure you are up to date with cancer screening.   Blood pressure There is not a combination pill with amlodipine and hydrochlorothiazide. To decrease your risk of stroke the goal is for BP to be <120/80. Please take both amlodipine and hydrochlorothiazide.  I have ordered the following labs for you:  Lab Orders         Glucose, capillary         TSH         CMP14 + Anion Gap         CBC with Diff         T4, Free         POC Hbg A1C      Referrals ordered today:   Referral Orders  No referral(s) requested today     I have ordered the following medication/changed the following medications:   Stop the following medications: Medications Discontinued During This Encounter  Medication Reason   amLODipine (NORVASC) 5 MG tablet Reorder   hydrochlorothiazide (HYDRODIURIL) 25 MG tablet Reorder   metFORMIN (GLUCOPHAGE) 1000 MG tablet Reorder     Start the following medications: Meds ordered this encounter  Medications   metFORMIN (GLUCOPHAGE) 1000 MG tablet    Sig: Take 1 tablet (1,000 mg total) by mouth 2 (two) times daily with a meal.    Dispense:  180 tablet    Refill:  0   amLODipine (NORVASC) 5 MG tablet    Sig: Take 1 tablet (5 mg total) by mouth daily for 180 doses.    Dispense:  90 tablet    Refill:  3   hydrochlorothiazide (HYDRODIURIL) 25 MG tablet    Sig: Take 1 tablet (25 mg total) by mouth daily.    Dispense:  90 tablet    Refill:  3     Follow up: 4 weeks  We look forward to seeing you next time. Please call our clinic at [phone] if you have any questions or concerns. The best time to call is Monday-Friday from 9am-4pm, but there is someone available 24/7. If after hours or the weekend, call the main hospital number and ask for  the Internal Medicine Resident On-Call. If you need medication refills, please notify your pharmacy one week in advance and they will send [place] a request.   Thank you for trusting me with your care. Wishing you the best!   Rudene Christians, DO [facility]

## 2022-08-19 DIAGNOSIS — R634 Abnormal weight loss: Secondary | ICD-10-CM | POA: Insufficient documentation

## 2022-08-19 DIAGNOSIS — F4320 Adjustment disorder, unspecified: Secondary | ICD-10-CM | POA: Insufficient documentation

## 2022-08-19 DIAGNOSIS — Z789 Other specified health status: Secondary | ICD-10-CM | POA: Insufficient documentation

## 2022-08-19 IMAGING — DX DG CHEST 1V PORT
1 series · 1 of 1 positions shown · non-contrast
Comparison: 03/29/2021

CLINICAL DATA: Chest pain

EXAM:
PORTABLE CHEST 1 VIEW

[chest ap]
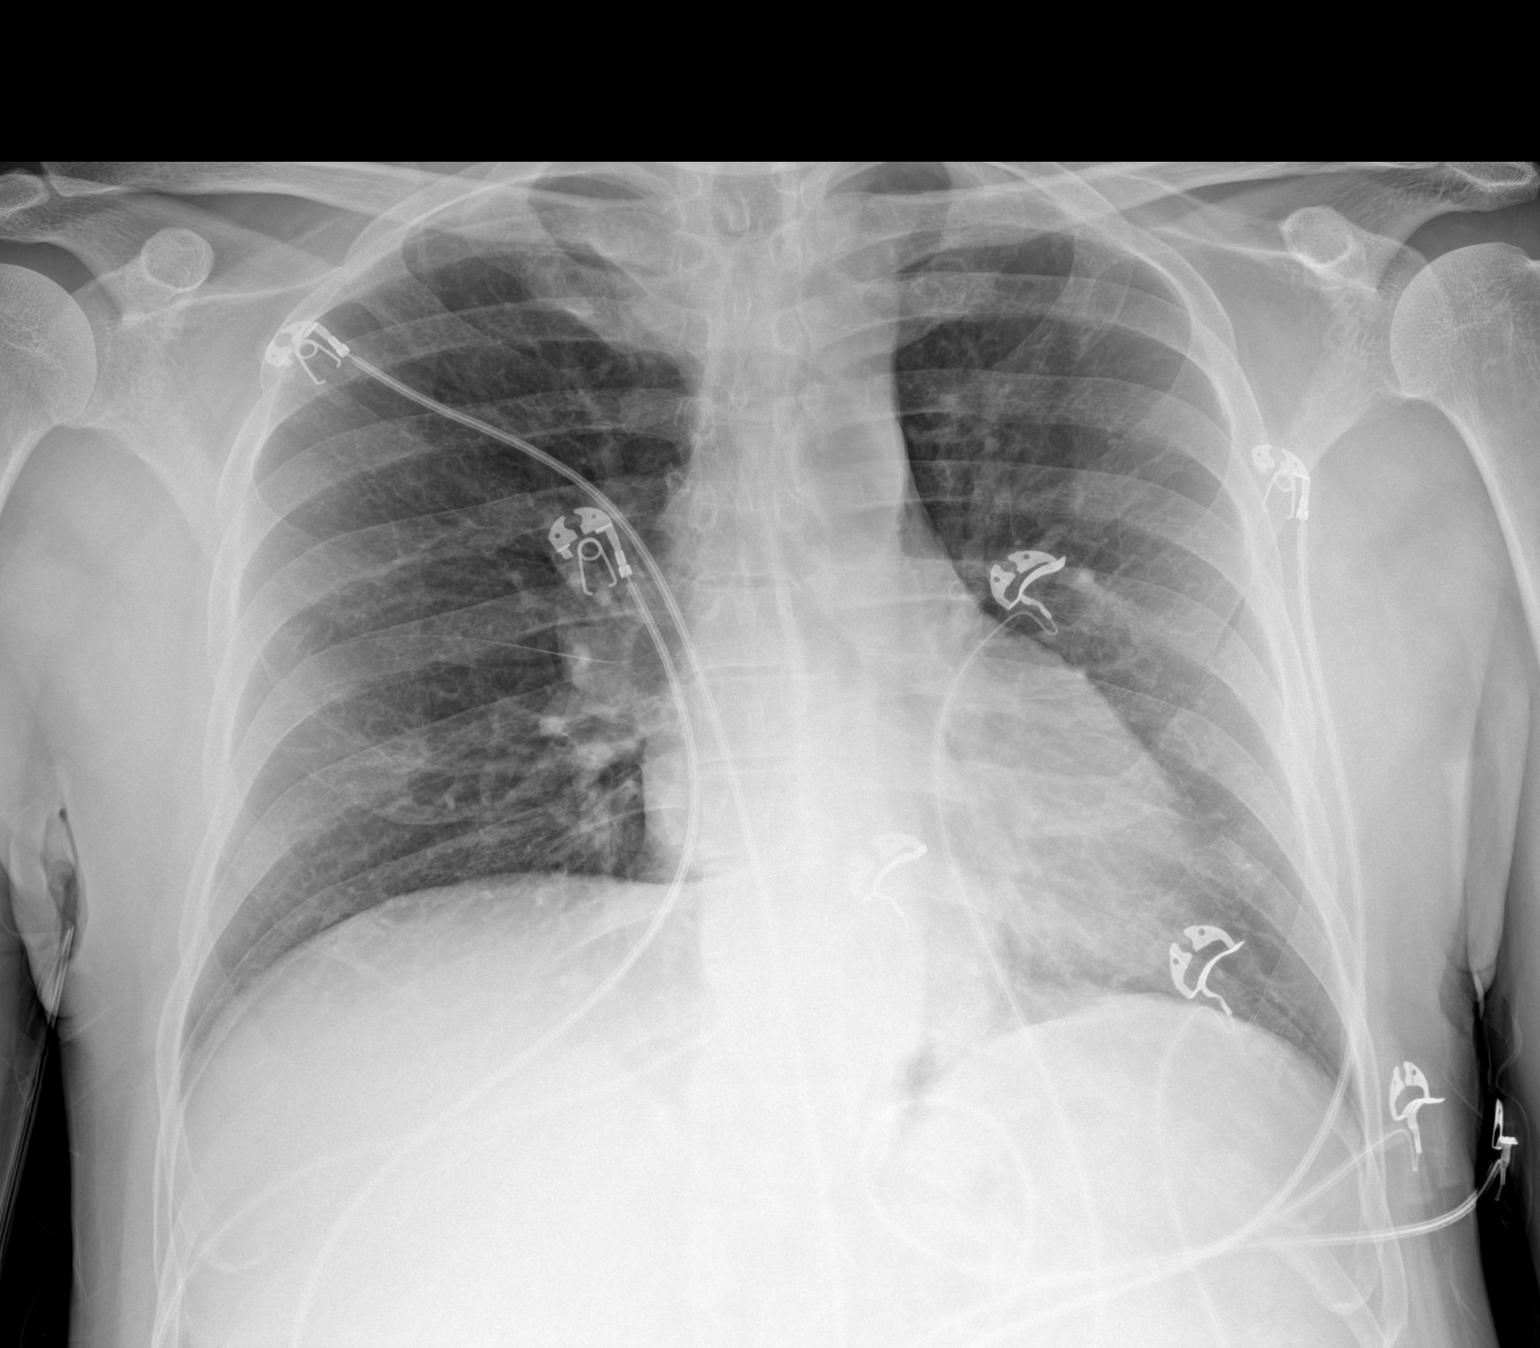

[1 of 1 positions shown; findings below may reference images not displayed]

FINDINGS: The heart size and mediastinal contours are within normal limits.
Both lungs are clear. The visualized skeletal structures are
unremarkable.
IMPRESSION: Normal study.

## 2022-08-19 NOTE — Assessment & Plan Note (Signed)
He was evaluated by rheum in setting of positive ANA with night sweats. Further lab evalution completed with sed rate, SPEP, anti-smith, sjogrens, anti-DNA.  This was negative including repeat ANA.  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 Last CXR completed while symptomatic with night sweats and did not show signs concerning for active TB. HIV negative 1 year ago. 8AM testosterone was >300. He denies cough, diarrhea, vomiting, fevers, chills, or pain. Differentials include hyperthyroidism vs diabetes. He does not have clear review of systems to put to other causes. P: He does not have insurance card with him today. Will place future lab orders and he plans to come back later this week with his card. TSH, free T4 CBC CMP

## 2022-08-19 NOTE — Assessment & Plan Note (Signed)
Lab Results  Component Value Date   HGBA1C 8.0 (A) 01/28/2021   A1c increased to 8. He does not take metformin regularly because he does not like taking medications. He is prescribed metformin 1000 mg BID. When he takes medication he does not have side effects. I talked with him about risks of elevated blood sugars overtime. P: Refilled Metformin 1000 mg bid, last refill 6/24 He does not like taking medications but is open to taking metformin. He does feel that his diet could improve but is not interested in going to a dietician at this time.  Repeat A1c in October.

## 2022-08-19 NOTE — Assessment & Plan Note (Addendum)
He has lost about 15 lbs in last 10 months unintentionally. He thinks that this is due to stress from work. He endorses decreased appetite and he is eating less in setting of stress. He continues to have night sweats that have been present for the last 10 years. Denies lymphadenopathy. - CMP, CBC w diff, TSH T4  Addendum 7/16: Quantferon gold added, however unable to reach patient. VM left

## 2022-08-19 NOTE — Assessment & Plan Note (Signed)
Initial 144/88, then increased to 153/84. He was prescribed Hydrochlorothiazide 25 and amlodipine 5 mg but stopped amlodipine as he didn't like taking medications. He did not have side effects. He endorses taking hydrochlorothiazide this AM. Allergies listed for ACE and ARB with facial swelling. P: Unfortunately limited with combination pills. Restart amlodipine 5 mg and continue hydrochlorothiazide 25 mg

## 2022-08-19 NOTE — Assessment & Plan Note (Signed)
    08/18/2022    4:47 PM 01/28/2021    9:10 AM  Depression screen PHQ 2/9  Decreased Interest 0 0  Down, Depressed, Hopeless 1 0  PHQ - 2 Score 1 0  Altered sleeping 2 0  Tired, decreased energy 2 0  Change in appetite 0 0  Feeling bad or failure about yourself  1 0  Trouble concentrating 0 0  Moving slowly or fidgety/restless 0 0  Suicidal thoughts 0 0  PHQ-9 Score 6 0  Difficult doing work/chores Somewhat difficult Not difficult at all      08/18/2022    4:48 PM  GAD 7 : Generalized Anxiety Score  Nervous, Anxious, on Edge 2  Control/stop worrying 2  Worry too much - different things 2  Trouble relaxing 2  Restless 0  Easily annoyed or irritable 2  Afraid - awful might happen 2  Total GAD 7 Score 12  Anxiety Difficulty Somewhat difficult    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 He has lost a lot of money and had trouble keeping business open. He thinks that all of positive screening questions are due to this situation.

## 2022-08-19 NOTE — Assessment & Plan Note (Signed)
Drinks about 3-4 times a week  4-6 drinks. He is not interested in decreasing amount. He does not feel that he has withdraw symptoms. He endorses night sweats when he doesn't drink but these persist even during times that he hasn't drank for >7 days. No history of withdraw sz.

## 2022-08-22 NOTE — Progress Notes (Signed)
Internal Medicine Clinic Attending  Case discussed with Dr. Sloan Leiter  At the time of the visit.  We reviewed the resident's history and exam and pertinent patient test results.  I agree with the assessment, diagnosis, and plan of care documented in the resident's note. I have discussed the addition of a Quant-Gold to patient's laboratory testing given travel history and presenting symptoms of weight loss/night sweats. No specific symptoms to identify site of infection at this time, but will continue to evaluate pending results. Short-term f/u recommended for continued weight trend and evaluation.

## 2022-08-22 NOTE — Addendum Note (Signed)
Addended by: Lucille Passy on: 08/22/2022 12:13 PM   Modules accepted: Orders

## 2022-08-25 NOTE — Addendum Note (Signed)
Addended by: Lucille Passy on: 08/25/2022 06:50 AM   Modules accepted: Orders

## 2022-08-28 ENCOUNTER — Telehealth: Payer: Self-pay | Admitting: Internal Medicine

## 2022-08-28 NOTE — Telephone Encounter (Signed)
Patient called yesterday and today, but I have not been able to reach him. With recent weight loss and long standing night sweats would like to get quant gold. Future order placed. He has not returned for other lab work from visit last week.

## 2022-09-01 ENCOUNTER — Telehealth: Payer: Self-pay | Admitting: Internal Medicine

## 2022-09-01 NOTE — Telephone Encounter (Signed)
Unable to reach patient, VM left to return call.

## 2022-09-21 ENCOUNTER — Other Ambulatory Visit: Payer: Self-pay | Admitting: Student

## 2022-09-21 DIAGNOSIS — N529 Male erectile dysfunction, unspecified: Secondary | ICD-10-CM

## 2022-09-22 ENCOUNTER — Other Ambulatory Visit: Payer: Self-pay

## 2022-09-22 DIAGNOSIS — N529 Male erectile dysfunction, unspecified: Secondary | ICD-10-CM

## 2022-09-22 NOTE — Telephone Encounter (Signed)
Next appt scheduled  8/27 with Dr Welton Flakes.

## 2022-09-22 NOTE — Telephone Encounter (Signed)
Pt is requesting a call back  he is requesting a temp supply of his ( sildenafil ) even though I told him I sent the refill request to his PCP  and it takes up to  48 hours

## 2022-09-25 ENCOUNTER — Telehealth: Payer: Self-pay | Admitting: Internal Medicine

## 2022-09-25 NOTE — Telephone Encounter (Signed)
I called and talked wth patient about bloodwork that was pended from last visit. He now has insurance card and plans to come get labs tomorrow morning. He was not aware of appt with Dr. Welton Flakes 8/27 but is able to make it to that appointment.

## 2022-09-26 ENCOUNTER — Other Ambulatory Visit (INDEPENDENT_AMBULATORY_CARE_PROVIDER_SITE_OTHER): Payer: 59

## 2022-09-26 DIAGNOSIS — R634 Abnormal weight loss: Secondary | ICD-10-CM | POA: Diagnosis not present

## 2022-09-26 DIAGNOSIS — R61 Generalized hyperhidrosis: Secondary | ICD-10-CM

## 2022-09-30 LAB — CMP14 + ANION GAP
ALT: 20 IU/L (ref 0–44)
AST: 20 IU/L (ref 0–40)
Albumin: 4.6 g/dL (ref 3.9–4.9)
Alkaline Phosphatase: 97 IU/L (ref 44–121)
Anion Gap: 15 mmol/L (ref 10.0–18.0)
BUN/Creatinine Ratio: 11 (ref 10–24)
BUN: 13 mg/dL (ref 8–27)
Bilirubin Total: <0.2 mg/dL (ref 0.0–1.2)
CO2: 23 mmol/L (ref 20–29)
Calcium: 11.4 mg/dL — ABNORMAL HIGH (ref 8.6–10.2)
Chloride: 99 mmol/L (ref 96–106)
Creatinine, Ser: 1.16 mg/dL (ref 0.76–1.27)
Globulin, Total: 2.9 g/dL (ref 1.5–4.5)
Glucose: 176 mg/dL — ABNORMAL HIGH (ref 70–99)
Potassium: 4.9 mmol/L (ref 3.5–5.2)
Sodium: 137 mmol/L (ref 134–144)
Total Protein: 7.5 g/dL (ref 6.0–8.5)
eGFR: 71 mL/min/{1.73_m2} (ref >59)

## 2022-09-30 LAB — CBC WITH DIFFERENTIAL/PLATELET
Basophils Absolute: 0 10*3/uL (ref 0.0–0.2)
Basos: 1 %
EOS (ABSOLUTE): 0.1 10*3/uL (ref 0.0–0.4)
Eos: 1 %
Hematocrit: 40.1 % (ref 37.5–51.0)
Hemoglobin: 13.4 g/dL (ref 13.0–17.7)
Immature Grans (Abs): 0 10*3/uL (ref 0.0–0.1)
Immature Granulocytes: 0 %
Lymphocytes Absolute: 1.6 10*3/uL (ref 0.7–3.1)
Lymphs: 37 %
MCH: 28.8 pg (ref 26.6–33.0)
MCHC: 33.4 g/dL (ref 31.5–35.7)
MCV: 86 fL (ref 79–97)
Monocytes Absolute: 0.4 10*3/uL (ref 0.1–0.9)
Monocytes: 9 %
Neutrophils Absolute: 2.3 10*3/uL (ref 1.4–7.0)
Neutrophils: 52 %
Platelets: 235 10*3/uL (ref 150–450)
RBC: 4.66 x10E6/uL (ref 4.14–5.80)
RDW: 13.7 % (ref 11.6–15.4)
WBC: 4.4 10*3/uL (ref 3.4–10.8)

## 2022-09-30 LAB — QUANTIFERON-TB GOLD PLUS
QuantiFERON Mitogen Value: >10 [IU]/mL
QuantiFERON Nil Value: 0.01 [IU]/mL
QuantiFERON TB1 Ag Value: 0.01 [IU]/mL
QuantiFERON TB2 Ag Value: 0.02 [IU]/mL
QuantiFERON-TB Gold Plus: NEGATIVE

## 2022-09-30 LAB — TSH: TSH: 2.76 u[IU]/mL (ref 0.450–4.500)

## 2022-09-30 LAB — T4, FREE: Free T4: 1.25 ng/dL (ref 0.82–1.77)

## 2022-10-03 ENCOUNTER — Encounter: Payer: Self-pay | Admitting: Internal Medicine

## 2022-10-06 ENCOUNTER — Encounter: Payer: Self-pay | Admitting: Internal Medicine

## 2022-10-06 NOTE — Progress Notes (Deleted)
   CC: unintentional weight loss/Night sweats  HPI:  [name] is a 62 y.o. with medical history of HTN, HLD, DMII, Gout presenting to [facility] for a follow up on his weight loss and night sweats. Last seen on 09/01/2022 for initial work up for this which has been unrevealing.   Please see problem-based list for further details, assessments, and plans.  Past Medical History:  Diagnosis Date   Abnormal EKG 03/29/2021   Acid reflux    Asthma    As a child   Chest pain 08/31/2020   Diabetes mellitus without complication (HCC)    Erectile dysfunction 01/28/2021   Gout    High cholesterol    Hypertension    Hypertensive urgency 03/29/2021    Current Outpatient Medications (Endocrine & Metabolic):    metFORMIN (GLUCOPHAGE) 1000 MG tablet, Take 1 tablet (1,000 mg total) by mouth 2 (two) times daily with a meal.  Current Outpatient Medications (Cardiovascular):    amLODipine (NORVASC) 5 MG tablet, Take 1 tablet (5 mg total) by mouth daily for 180 doses.   atorvastatin (LIPITOR) 10 MG tablet, Take 1 tablet (10 mg total) by mouth daily. (Patient not taking: Reported on 03/30/2021)   hydrochlorothiazide (HYDRODIURIL) 25 MG tablet, Take 1 tablet (25 mg total) by mouth daily.   sildenafil (VIAGRA) 50 MG tablet, TAKE 1 TABLET BY MOUTH ONCE DAILY AS NEEDED 1  HOUR  BEFORE  SEXUAL  ACTIVITY  FOR  ERECTILE  DYSFUNCTION  Current Outpatient Medications (Respiratory):    albuterol (VENTOLIN HFA) 108 (90 Base) MCG/ACT inhaler, Inhale 2 puffs into the lungs every 6 (six) hours as needed for wheezing or shortness of breath.   diphenhydrAMINE (BENADRYL) 25 MG tablet, Take 25 mg by mouth every 6 (six) hours as needed.  Current Outpatient Medications (Analgesics):    allopurinol (ZYLOPRIM) 300 MG tablet, Take 1 tablet (300 mg total) by mouth daily.   aspirin EC 81 MG EC tablet, Take 1 tablet (81 mg total) by mouth daily. Swallow whole. (Patient not taking: Reported on 04/09/2021)    Review of  Systems:  Review of system negative unless stated in the problem list or HPI.    Physical Exam:  There were no vitals filed for this visit. Physical Exam General: NAD HENT: NCAT Lungs: CTAB, no wheeze, rhonchi or rales.  Cardiovascular: Normal heart sounds, no r/m/g, 2+ pulses in all extremities. No LE edema Abdomen: No TTP, normal bowel sounds MSK: No asymmetry or muscle atrophy.  Skin: no lesions noted on exposed skin Neuro: Alert and oriented x4. CN grossly intact Psych: Normal mood and normal affect   Assessment & Plan:   No problem-specific Assessment & Plan notes found for this encounter.   See Encounters Tab for problem based charting.  Patient Discussed with Dr. {NAMES:[id number]::"Guilloud","Hoffman","Mullen","Narendra","Vincent","Machen","Lau","Hatcher","Williams"} Gwenevere Abbot, MD Eligha Bridegroom. [facility] Internal Medicine Residency, PGY-3   Unintentional weight loss Colonoscopy  Hypercalcemia: CXR.

## 2022-10-07 ENCOUNTER — Encounter: Payer: Self-pay | Admitting: Internal Medicine

## 2022-11-18 ENCOUNTER — Ambulatory Visit
Admission: EM | Admit: 2022-11-18 | Discharge: 2022-11-18 | Disposition: A | Discharge status: Home / Self Care | Payer: 59 | Attending: Emergency Medicine | Admitting: Emergency Medicine

## 2022-11-18 DIAGNOSIS — H7291 Unspecified perforation of tympanic membrane, right ear: Secondary | ICD-10-CM

## 2022-11-18 DIAGNOSIS — H60391 Other infective otitis externa, right ear: Secondary | ICD-10-CM

## 2022-11-18 MED ORDER — OFLOXACIN 0.3 % OT SOLN: 10.0000 [drp] | Freq: Every day | OTIC | 0 refills | Status: DC

## 2022-11-18 NOTE — Discharge Instructions (Signed)
Your evaluated for your and symptoms, on exam there is Patrick Hawkins pus drainage and blood within the ear canal and the eardrum has burst, this is most likely due to the infection  A busted eardrum typically improves with time and will heal itself over the coming weeks a busted eardrum typically improves with time and will heal itself over the coming weeks, you may follow-up with your primary doctor in 4 to 6 weeks for recheck you may follow-up with your doctor in 4 to 6 weeks for recheck  Place 10 drops of ofloxacin into the right ear every morning for 7 days to clear infection  For pain and discomfort you may use Tylenol and/or Motrin as needed or you may apply warm compresses to the outer ear  Do not stick any objects attempted thoroughly clean the ears until you have completed all medication and fullness has resolved, you may tilt head to the side and use cloth/napkins or cotton balls to remove drainage as needed  You may follow-up with this urgent care as needed for any concerns regarding healing

## 2022-11-18 NOTE — ED Provider Notes (Signed)
Patrick Hawkins    CSN: [id number] Arrival date & time: 11/18/22  1501      History   Chief Complaint Chief Complaint  Patient presents with   Otalgia    HPI [name] is a 62 y.o. male.   Patient presents for evaluation of right-sided ear fullness beginning this morning.  Felt as if something was in his ear and when he touched it felt drainage which had a foul odor.  Attempted to clean ear and noticed blood therefore he sought evaluation.  Denies itching, nasal congestion, fever.  Has not attempted treatment.  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 Authorizing Provider  ofloxacin (FLOXIN) 0.3 % OTIC solution Place 10 drops into the right ear daily. 11/18/22  Yes Kiley Torrence, Elita Boone, NP  albuterol (VENTOLIN HFA) 108 (90 Base) MCG/ACT inhaler Inhale 2 puffs into the lungs every 6 (six) hours as needed for  wheezing or shortness of breath. 08/31/20   Clydie Braun, MD  allopurinol (ZYLOPRIM) 300 MG tablet Take 1 tablet (300 mg total) by mouth daily. 04/17/21   Rice, Jamesetta Orleans, MD  amLODipine (NORVASC) 5 MG tablet Take 1 tablet (5 mg total) by mouth daily for 180 doses. 08/18/22 02/14/23  Masters, Katie, DO  aspirin EC 81 MG EC tablet Take 1 tablet (81 mg total) by mouth daily. Swallow whole. Patient not taking: Reported on 04/09/2021 03/31/21   Darlin Priestly, MD  atorvastatin (LIPITOR) 10 MG tablet Take 1 tablet (10 mg total) by mouth daily. Patient not taking: Reported on 03/30/2021 01/28/21 07/27/21  Gardenia Phlegm, MD  diphenhydrAMINE (BENADRYL) 25 MG tablet Take 25 mg by mouth every 6 (six) hours as needed.    [provider]  hydrochlorothiazide (HYDRODIURIL) 25 MG tablet Take 1 tablet (25 mg total) by mouth daily. 08/18/22   Masters, Florentina Addison, DO  metFORMIN (GLUCOPHAGE) 1000 MG tablet Take 1 tablet (1,000 mg total) by mouth 2 (two) times daily with a meal. 08/18/22   Masters, Katie, DO  sildenafil (VIAGRA) 50 MG tablet TAKE 1 TABLET BY MOUTH ONCE DAILY AS NEEDED 1  HOUR  BEFORE  SEXUAL  ACTIVITY  FOR  ERECTILE  DYSFUNCTION 09/22/22   Masters, Florentina Addison, DO  traZODone (DESYREL) 50 MG tablet Take 1 tablet (50 mg total) by mouth at bedtime. 05/30/19 03/08/20  Lamptey, Britta Mccreedy, MD    Family History Family History  Problem Relation Age of Onset   Diabetes Mother    Diabetes Father    Diabetes Sister    Diabetes Sister    Diabetes Brother    Parkinson's disease Brother     Social History Social History   Tobacco Use   Smoking status: Former    Types: Cigars   Smokeless tobacco: Never  Vaping Use   Vaping status: Never Used  Substance Use Topics   Alcohol use: Yes    Alcohol/week: 18.0 standard drinks of alcohol    Types: 18 Cans of beer per week   Drug use: Never     Allergies   Ace inhibitors, Cat hair extract, Losartan, Shellfish allergy, and Lisinopril   Review of Systems Review of  Systems   Physical Exam Triage Vital Signs ED Triage Vitals  Encounter Vitals Group     BP 11/18/22 1520 (!) 145/91     Systolic BP Percentile --      Diastolic BP Percentile --      Pulse Rate 11/18/22 1520 88     Resp 11/18/22 1520 17     Temp 11/18/22 1520 98.1 F (36.7 C)     Temp src --      SpO2 11/18/22 1520 95 %     Weight --      Height --      Head Circumference --      Peak Flow --      Pain Score 11/18/22 1519 0     Pain Loc --      Pain Education --      Exclude from Growth Chart --    No data found.  Updated Vital Signs BP (!) 145/91   Pulse 88   Temp 98.1 F (36.7 C)   Resp 17   SpO2 95%   Visual Acuity Right Eye Distance:   Left Eye Distance:   Bilateral Distance:    Right Eye Near:   Left Eye Near:    Bilateral Near:     Physical Exam HENT:     Left Ear: Hearing, tympanic membrane, ear canal and external ear normal.     Ears:     Comments: Deja Pisarski puslike and bloody drainage, mild to moderate swelling and erythema noted within the right ear canal, perforated eardrum present     UC Treatments / Results  Labs (all labs ordered are listed, but only abnormal results are displayed) Labs Reviewed - No data to display  EKG   Radiology No results found.  Procedures Procedures (including critical care time)  Medications Ordered in UC Medications - No data to display  Initial Impression / Assessment and Plan / UC Course  I have reviewed the triage vital signs and the nursing notes.  Pertinent labs & imaging results that were available during my care of the patient were reviewed by me and considered in my medical decision making (see chart for details).  Infective otitis externa, right, perforated eardrum, right  Presentation is consistent with infection, infection most likely cause of rupture, discussed with patient, prescribed ofloxacin and recommend over-the-counter analgesics and warm compresses to the external ear, advised against  ear cleaning or object placement in the canal to prevent further irritation, discussed timeline of resolution of perforation and advised follow-up in 4 to 6 weeks with PCP for recheck, may follow-up with urgent care for any concern regarding healing of infection Final Clinical Impressions(s) / UC  Diagnoses   Final diagnoses:  Infective otitis externa, right  Perforated ear drum, right     Discharge Instructions      Your evaluated for your and symptoms, on exam there is Konnie Noffsinger pus drainage and blood within the ear canal and the eardrum has burst, this is most likely due to the infection  A busted eardrum typically improves with time and will heal itself over the coming weeks a busted eardrum typically improves with time and will heal itself over the coming weeks, you may follow-up with your primary doctor in 4 to 6 weeks for recheck you may follow-up with your doctor in 4 to 6 weeks for recheck  Place 10 drops of ofloxacin into the right ear every morning for 7 days to clear infection  For pain and discomfort you may use Tylenol and/or Motrin as needed or you may apply warm compresses to the outer ear  Do not stick any objects attempted thoroughly clean the ears until you have completed all medication and fullness has resolved, you may tilt head to the side and use cloth/napkins or cotton balls to remove drainage as needed  You may follow-up with this urgent care as needed for any concerns regarding healing   ED Prescriptions     Medication Sig Dispense Auth. Provider   ofloxacin (FLOXIN) 0.3 % OTIC solution Place 10 drops into the right ear daily. 5 mL Valinda Hoar, NP      PDMP not reviewed this encounter.   Valinda Hoar, NP 11/18/22 1554

## 2022-11-18 NOTE — ED Triage Notes (Signed)
Patient to Urgent Care with complaints of right sided ear fullness/ reports this afternoon he had some bloody drainage.  Woke up this morning with symptoms. Denies any pain. Denies any fevers.

## 2022-11-27 ENCOUNTER — Ambulatory Visit
Admission: EM | Admit: 2022-11-27 | Discharge: 2022-11-27 | Disposition: A | Discharge status: Home / Self Care | Payer: 59 | Attending: Emergency Medicine | Admitting: Emergency Medicine

## 2022-11-27 DIAGNOSIS — H60391 Other infective otitis externa, right ear: Secondary | ICD-10-CM

## 2022-11-27 MED ORDER — AMOXICILLIN-POT CLAVULANATE 875-125 MG PO TABS
1.0000 | ORAL_TABLET | Freq: Two times a day (BID) | ORAL | 0 refills | Status: AC
Start: 2022-11-27 — End: 2022-12-07

## 2022-11-27 NOTE — Discharge Instructions (Addendum)
You are being treated for continued ear fullness and decreased hearing  On exam the ear canal is blocked which initially appeared to be due to pus or wax, attempted to remove but after removal of the first layer area began to bleed which leads me to believe that this is possible an overgrowth of tissue and therefore will not continue with attempted removal  As the eardrops were ineffective begin use of oral antibiotics  Take Augmentin every morning and every evening for 10 days, if you have not seen any improvement in 3 days of using antibiotic you need to be rechecked  You may use Tylenol or ibuprofen for management of discomfort  May hold warm compresses to the ear for additional comfort  Please not attempted any ear cleaning or object or fluid placement into the ear canal to prevent further irritation  Please schedule follow-up appointment with ear nose and throat which is the specialist for follow-up and further management

## 2022-11-27 NOTE — ED Provider Notes (Signed)
Patrick Hawkins    CSN: [id number] Arrival date & time: 11/27/22  1525      History   Chief Complaint Chief Complaint  Patient presents with   Otalgia    HPI Patrick Hawkins is a 62 y.o. male.   Patient presents for persisting ear fullness, decreased hearing and intermittent right-sided ear pain beginning 2 weeks ago.  Endorses continued yellow drainage from the right ear.  Was evaluated in this urgent care, diagnosed with an outer ear infection and perforated eardrum, took Ciprodex as directed with no improvement in symptoms.  Attempted to be seen by PCP but unable to be seen until December.  Denies fever.   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 Authorizing Provider  amoxicillin-clavulanate (AUGMENTIN) 875-125 MG tablet Take 1 tablet by mouth every 12 (twelve) hours for 10 days. 11/27/22 12/07/22 Yes Muscab Brenneman, Elita Boone, NP  albuterol (VENTOLIN HFA) 108 (90 Base) MCG/ACT inhaler Inhale 2 puffs into the lungs every 6 (six) hours as needed for wheezing or shortness of breath. 08/31/20   Clydie Braun, MD  allopurinol (ZYLOPRIM) 300 MG tablet Take 1 tablet (300 mg total) by mouth daily. 04/17/21   Rice,  Jamesetta Orleans, MD  amLODipine (NORVASC) 5 MG tablet Take 1 tablet (5 mg total) by mouth daily for 180 doses. 08/18/22 02/14/23  Masters, Katie, DO  aspirin EC 81 MG EC tablet Take 1 tablet (81 mg total) by mouth daily. Swallow whole. Patient not taking: Reported on 04/09/2021 03/31/21   Darlin Priestly, MD  atorvastatin (LIPITOR) 10 MG tablet Take 1 tablet (10 mg total) by mouth daily. Patient not taking: Reported on 03/30/2021 01/28/21 07/27/21  Gardenia Phlegm, MD  diphenhydrAMINE (BENADRYL) 25 MG tablet Take 25 mg by mouth every 6 (six) hours as needed.    [provider]  hydrochlorothiazide (HYDRODIURIL) 25 MG tablet Take 1 tablet (25 mg total) by mouth daily. 08/18/22   Masters, Florentina Addison, DO  metFORMIN (GLUCOPHAGE) 1000 MG tablet Take 1 tablet (1,000 mg total) by mouth 2 (two) times daily with a meal. 08/18/22   Masters, Katie, DO  ofloxacin (FLOXIN) 0.3 % OTIC solution Place 10 drops into the right ear daily. 11/18/22   Shyann Hefner, Elita Boone, NP  sildenafil (VIAGRA) 50 MG tablet TAKE 1 TABLET BY MOUTH ONCE DAILY AS NEEDED 1  HOUR  BEFORE  SEXUAL  ACTIVITY  FOR  ERECTILE  DYSFUNCTION 09/22/22   Masters, Florentina Addison, DO  traZODone (DESYREL) 50 MG tablet Take 1 tablet (50 mg total) by mouth at bedtime. 05/30/19 03/08/20  Merrilee Jansky, MD    Family History Family History  Problem Relation Age of Onset   Diabetes Mother  Diabetes Father    Diabetes Sister    Diabetes Sister    Diabetes Brother    Parkinson's disease Brother     Social History Social History   Tobacco Use   Smoking status: Former    Types: Cigars   Smokeless tobacco: Never  Vaping Use   Vaping status: Never Used  Substance Use Topics   Alcohol use: Yes    Alcohol/week: 18.0 standard drinks of alcohol    Types: 18 Cans of beer per week   Drug use: Never     Allergies   Ace inhibitors, Cat hair extract, Losartan, Shellfish allergy, and Lisinopril   Review of Systems Review of Systems   Physical Exam Triage Vital  Signs ED Triage Vitals  Encounter Vitals Group     BP 11/27/22 1538 (!) 145/87     Systolic BP Percentile --      Diastolic BP Percentile --      Pulse Rate 11/27/22 1538 85     Resp 11/27/22 1538 17     Temp 11/27/22 1538 98.1 F (36.7 C)     Temp src --      SpO2 11/27/22 1538 100 %     Weight --      Height --      Head Circumference --      Peak Flow --      Pain Score 11/27/22 1535 4     Pain Loc --      Pain Education --      Exclude from Growth Chart --    No data found.  Updated Vital Signs BP (!) 145/87   Pulse 85   Temp 98.1 F (36.7 C)   Resp 17   SpO2 100%   Visual Acuity Right Eye Distance:   Left Eye Distance:   Bilateral Distance:    Right Eye Near:   Left Eye Near:    Bilateral Near:     Physical Exam Constitutional:      Appearance: Normal appearance.  HENT:     Ears:     Comments: Large yellow impaction noted to the right ear canal Eyes:     Extraocular Movements: Extraocular movements intact.  Pulmonary:     Effort: Pulmonary effort is normal.  Neurological:     Mental Status: He is alert and oriented to person, place, and time. Mental status is at baseline.      UC Treatments / Results  Labs (all labs ordered are listed, but only abnormal results are displayed) Labs Reviewed - No data to display  EKG   Radiology No results found.  Procedures Procedures (including critical care time)  Medications Ordered in UC Medications - No data to display  Initial Impression / Assessment and Plan / UC Course  I have reviewed the triage vital signs and the nursing notes.  Pertinent labs & imaging results that were available during my care of the patient were reviewed by me and considered in my medical decision making (see chart for details).  Infective otitis externa of the right ear  Yellow impaction noted on exam, appearing to be drainage, attempted to removed with curette after a small amount removed, erythematous granular tissue  noted and bleeding, impacted area now appearing as overgrown tissue therefore attempt at removal discontinued, discussed this with patient, will prescribe oral antibiotics as no improvement seen with drops and given referral to ear nose and throat for further evaluation and management May use over-the-counter analgesics for management discomfort  as well as warm compresses to the external ear, advised against ear cleaning until further notice, may follow-up with urgent care and only if unable to be seen by specialist Final Clinical Impressions(s) / UC Diagnoses   Final diagnoses:  Infective otitis externa of right ear     Discharge Instructions      You are being treated for continued ear fullness and decreased hearing  On exam the ear canal is blocked which initially appeared to be due to pus or wax, attempted to remove but after removal of the first layer area began to bleed which leads me to believe that this is possible an overgrowth of tissue and therefore will not continue with attempted removal  As the eardrops were ineffective begin use of oral antibiotics  Take Augmentin every morning and every evening for 10 days, if you have not seen any improvement in 3 days of using antibiotic you need to be rechecked  You may use Tylenol or ibuprofen for management of discomfort  May hold warm compresses to the ear for additional comfort  Please not attempted any ear cleaning or object or fluid placement into the ear canal to prevent further irritation  Please schedule follow-up appointment with ear nose and throat which is the specialist for follow-up and further management    ED Prescriptions     Medication Sig Dispense Auth. Provider   amoxicillin-clavulanate (AUGMENTIN) 875-125 MG tablet Take 1 tablet by mouth every 12 (twelve) hours for 10 days. 20 tablet Valinda Hoar, NP      PDMP not reviewed this encounter.   Valinda Hoar, NP 11/27/22 [phone]

## 2022-11-27 NOTE — ED Triage Notes (Signed)
Patient to Urgent Care with complaints of right sided ear pain and fullness.   Symptoms started two weeks ago. Reports no improvement with prescribed medications. Now having some yellow drainage and pain.

## 2022-11-30 ENCOUNTER — Other Ambulatory Visit: Payer: Self-pay | Admitting: Internal Medicine

## 2022-11-30 DIAGNOSIS — N529 Male erectile dysfunction, unspecified: Secondary | ICD-10-CM

## 2022-12-01 ENCOUNTER — Other Ambulatory Visit: Payer: Self-pay | Admitting: Otolaryngology

## 2022-12-01 DIAGNOSIS — D2320 Other benign neoplasm of skin of unspecified ear and external auricular canal: Secondary | ICD-10-CM

## 2022-12-01 DIAGNOSIS — H9011 Conductive hearing loss, unilateral, right ear, with unrestricted hearing on the contralateral side: Secondary | ICD-10-CM

## 2022-12-01 DIAGNOSIS — H90A21 Sensorineural hearing loss, unilateral, right ear, with restricted hearing on the contralateral side: Secondary | ICD-10-CM

## 2022-12-01 NOTE — Telephone Encounter (Signed)
Patient is requesting his rx to be refilled as 100 MG.

## 2022-12-01 NOTE — Plan of Care (Signed)
[facility] Tonsillectomy/Adenoidectomy, Postoperative PEDS care plan entered in error.

## 2022-12-02 ENCOUNTER — Ambulatory Visit
Admission: RE | Admit: 2022-12-02 | Discharge: 2022-12-02 | Disposition: A | Discharge status: Home / Self Care | Payer: 59 | Source: Ambulatory Visit | Attending: Otolaryngology | Admitting: Otolaryngology

## 2022-12-02 DIAGNOSIS — D2320 Other benign neoplasm of skin of unspecified ear and external auricular canal: Secondary | ICD-10-CM | POA: Insufficient documentation

## 2022-12-02 DIAGNOSIS — H90A21 Sensorineural hearing loss, unilateral, right ear, with restricted hearing on the contralateral side: Secondary | ICD-10-CM | POA: Insufficient documentation

## 2022-12-02 LAB — POCT I-STAT CREATININE: Creatinine, Ser: 1.2 mg/dL (ref 0.61–1.24)

## 2022-12-02 MED ORDER — IOHEXOL 300 MG/ML  SOLN
75.0000 mL | Freq: Once | INTRAMUSCULAR | Status: AC | PRN
  Administered 2022-12-02: 75 mL via INTRAVENOUS

## 2022-12-03 ENCOUNTER — Telehealth (INDEPENDENT_AMBULATORY_CARE_PROVIDER_SITE_OTHER): Payer: Self-pay

## 2022-12-03 NOTE — Telephone Encounter (Signed)
Patient called to see if we can take him as a patient because his ENT doesn't accept his insurance. Patient says he has a Ct scan and biopsy done. Told patient we needed a referral so I spoke with the current ENT to please send patient records and referral. Gave fax number so she can send. Told patient to call back in a couple of days to make sure we got records and referral.

## 2022-12-05 ENCOUNTER — Emergency Department
Admission: EM | Admit: 2022-12-05 | Discharge: 2022-12-05 | Disposition: A | Discharge status: Home / Self Care | Payer: 59 | Attending: Emergency Medicine | Admitting: Emergency Medicine

## 2022-12-05 ENCOUNTER — Other Ambulatory Visit: Payer: Self-pay

## 2022-12-05 DIAGNOSIS — H61891 Other specified disorders of right external ear: Secondary | ICD-10-CM | POA: Insufficient documentation

## 2022-12-05 DIAGNOSIS — H938X1 Other specified disorders of right ear: Secondary | ICD-10-CM

## 2022-12-05 DIAGNOSIS — H9201 Otalgia, right ear: Secondary | ICD-10-CM | POA: Diagnosis present

## 2022-12-05 MED ORDER — CEFDINIR 300 MG PO CAPS: 300.0000 mg | ORAL_CAPSULE | Freq: Two times a day (BID) | ORAL | 0 refills | Status: AC

## 2022-12-05 MED ORDER — ONDANSETRON 4 MG PO TBDP
4.0000 mg | ORAL_TABLET | Freq: Once | ORAL | Status: AC
  Administered 2022-12-05: 4 mg via ORAL
  Filled 2022-12-05: qty 1

## 2022-12-05 MED ORDER — OXYCODONE-ACETAMINOPHEN 5-325 MG PO TABS
1.0000 | ORAL_TABLET | Freq: Four times a day (QID) | ORAL | 0 refills | Status: DC | PRN
Start: 2022-12-05 — End: 2023-01-26

## 2022-12-05 MED ORDER — OXYCODONE-ACETAMINOPHEN 5-325 MG PO TABS
2.0000 | ORAL_TABLET | Freq: Once | ORAL | Status: AC
  Administered 2022-12-05: 2 via ORAL
  Filled 2022-12-05: qty 2

## 2022-12-05 MED ORDER — CIPROFLOXACIN-DEXAMETHASONE 0.3-0.1 % OT SUSP
4.0000 [drp] | Freq: Once | OTIC | Status: AC
  Administered 2022-12-05: 4 [drp] via OTIC
  Filled 2022-12-05: qty 7.5

## 2022-12-05 NOTE — ED Triage Notes (Signed)
Pt comes with c/o right ear pain. Pt states this started few days ago. Pt states he is now feeling pain on that side while eating also. Pt states he was prescribed meds in past but didn't really help.

## 2022-12-05 NOTE — ED Provider Notes (Signed)
[facility] Provider Note    Event Date/Time   First MD Initiated Contact with Patient 12/05/22 1108     (approximate)   History   Otalgia   HPI  Patrick Hawkins is a 62 y.o. male here with pain to his right ear drum.  The patient recently has been seen multiple times by ENT for a soft tissue mass versus severe otitis externa of the right ear.  He had a CT scan and recent biopsy which is concerning for possible soft tissue mass.  He was referred to [facility] but they did not accept his insurance so he is currently going to [facility].  He states he has an appointment in a week.  However, he feels like the mass seems to be getting larger and he has had some mild increased pain around it so he presents for evaluation.  Denies any fevers or chills.  Denies any ear ringing.  Denies any other complaints.     Physical Exam   Triage Vital Signs: ED Triage Vitals  Encounter Vitals Group     BP 12/05/22 0916 (!) 160/86     Systolic BP Percentile --      Diastolic BP Percentile --      Pulse Rate 12/05/22 0916 91     Resp 12/05/22 0916 18     Temp 12/05/22 0916 98.1 F (36.7 C)     Temp src --      SpO2 12/05/22 0916 98 %     Weight 12/05/22 0915 170 lb (77.1 kg)     Height 12/05/22 0915 5\' 10"  (1.778 m)     Head Circumference --      Peak Flow --      Pain Score 12/05/22 0915 4     Pain Loc --      Pain Education --      Exclude from Growth Chart --     Most recent vital signs: Vitals:   12/05/22 0916 12/05/22 1429  BP: (!) 160/86 (!) 155/87  Pulse: 91 90  Resp: 18 16  Temp: 98.1 F (36.7 C) 98.1 F (36.7 C)  SpO2: 98% 98%     General: Awake, no distress.  CV:  Good peripheral perfusion.  Regular rate and rhythm.  No murmurs, rubs, or gallops. Resp:  Normal work of breathing.  Abd:  No distention.  Other:  Right external auditory canal with large soft tissue mass in the external canal, occluding the tympanic membrane.  This appears tan/gray  in areas, with some secondary excoriation, redness, and serous drainage.  No overt purulence.  No mastoid tenderness.   ED Results / Procedures / Treatments   Labs (all labs ordered are listed, but only abnormal results are displayed) Labs Reviewed - No data to display   EKG    RADIOLOGY CT temporal bone 10/22: Near complete opacification of the right external auditory canal from sift tissue mass   I also independently reviewed and agree with radiologist interpretations.   PROCEDURES:  Critical Care performed: No   MEDICATIONS ORDERED IN ED: Medications  oxyCODONE-acetaminophen (PERCOCET/ROXICET) 5-325 MG per tablet 2 tablet (2 tablets Oral Given 12/05/22 1142)  ondansetron (ZOFRAN-ODT) disintegrating tablet 4 mg (4 mg Oral Given 12/05/22 1142)  ciprofloxacin-dexamethasone (CIPRODEX) 0.3-0.1 % OTIC (EAR) suspension 4 drop (4 drops Right EAR Given 12/05/22 1246)     IMPRESSION / MDM / ASSESSMENT AND PLAN / ED COURSE  I reviewed the triage vital signs and the nursing  notes.                              Differential diagnosis includes, but is not limited to, soft tissue mass/CA of EAC, otitis externa w/ mass, mastoiditis, otitis media  Patient's presentation is most consistent with acute presentation with potential threat to life or bodily function.  62 yo M here with mass/drainage from R ear. 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 He's had extensive recent w/u including CT and ENT f/u. Today, he is mostly here due to concern for difficulty with arranging f/u, and slight increased pain. Exam is as above, largely benign. No signs of mastoiditis. Discussed at length with Dr. Okey Dupre, who had seen pt previously. Will start on ciprodex and cefdinir, but pt's main issue is the need for tertiary f/u. I discussed that regardless of insurance issues, he needs to be seen at [facility] and [facility]. Dr. Okey Dupre will resend the referral and pt will call  today. Return precautions given.    FINAL CLINICAL IMPRESSION(S) / ED DIAGNOSES   Final diagnoses:  Mass of right ear canal     Rx / DC Orders   ED Discharge Orders          Ordered    cefdinir (OMNICEF) 300 MG capsule  2 times daily        12/05/22 1412    oxyCODONE-acetaminophen (PERCOCET) 5-325 MG tablet  Every 6 hours PRN        12/05/22 1412             Note:  This document was prepared using Dragon voice recognition software and may include unintentional dictation errors.   Shaune Pollack, MD 12/05/22 1435

## 2022-12-05 NOTE — Discharge Instructions (Addendum)
STOP the Amoxicillin and start the Cefdinir antibiotic prescribed today.  Apply the ear drops twice a day for 7-10 days.  CALL [facility] to tell them you were seen again in the ER at [facility], and that Dr. Okey Dupre, with ENT here, recommended and believes you will NEED [facility] or tertiary care for treatment. Tell them you'd like to set up an appointment regardless of insurance status for now.  IF YOU HAVE WORSENING PAIN, BLEEDING, OR DRAINAGE, I'D RECOMMEND ACTUALLY GOING TO THE [facility] ED (MAIN CAMPUS, IN [place]) TO BE SEEN - THAT WAY THE SPECIALISTS CAN SEE YOU DIRECTLY TO START YOUR TREATMENT PLAN  Dr. Serita Kyle office is going to send your info to them. Call that office for any further questions

## 2022-12-05 NOTE — Telephone Encounter (Signed)
Pt called stating that he was seen in the ED this morning and that Dr. Erma Heritage had spoken with his ENT doctor, Dr. Okey Dupre and they had agreed patient needed an urgent referral to [facility]. Pt states that Dr. Okey Dupre did not sent the referral as urgent and that he needs EDP to call Dr. Okey Dupre and have him resend the referral as urgent. This RN explained to the patient that if his ENT did not send the referral over correctly that he needed to call and have them resend it. Pt was addiment that the EDP should do this and and if he was not able to do this he wanted to be referral to a different ENT. A secure chat was sent to EDP notifying him of the above.

## 2022-12-11 ENCOUNTER — Institutional Professional Consult (permissible substitution) (INDEPENDENT_AMBULATORY_CARE_PROVIDER_SITE_OTHER): Payer: 59

## 2022-12-19 ENCOUNTER — Other Ambulatory Visit: Payer: Self-pay | Admitting: Internal Medicine

## 2022-12-19 DIAGNOSIS — N529 Male erectile dysfunction, unspecified: Secondary | ICD-10-CM

## 2022-12-22 NOTE — Telephone Encounter (Signed)
Pt is requesting 90 tabs. Last refill was 10/21 for 15 tabs. Next appt scheduled 02/23/23 with PCP.

## 2023-01-13 ENCOUNTER — Emergency Department: Payer: 59

## 2023-01-13 ENCOUNTER — Inpatient Hospital Stay
Admission: EM | Admit: 2023-01-13 | Discharge: 2023-01-16 | DRG: 378 | Disposition: A | Discharge status: Home / Self Care | Payer: 59 | Attending: Obstetrics and Gynecology | Admitting: Obstetrics and Gynecology

## 2023-01-13 ENCOUNTER — Other Ambulatory Visit: Payer: Self-pay

## 2023-01-13 DIAGNOSIS — K5733 Diverticulitis of large intestine without perforation or abscess with bleeding: Principal | ICD-10-CM | POA: Diagnosis present

## 2023-01-13 DIAGNOSIS — N179 Acute kidney failure, unspecified: Secondary | ICD-10-CM | POA: Diagnosis present

## 2023-01-13 DIAGNOSIS — M109 Gout, unspecified: Secondary | ICD-10-CM | POA: Diagnosis present

## 2023-01-13 DIAGNOSIS — I2489 Other forms of acute ischemic heart disease: Secondary | ICD-10-CM | POA: Diagnosis present

## 2023-01-13 DIAGNOSIS — Z87891 Personal history of nicotine dependence: Secondary | ICD-10-CM | POA: Diagnosis not present

## 2023-01-13 DIAGNOSIS — Y929 Unspecified place or not applicable: Secondary | ICD-10-CM | POA: Diagnosis not present

## 2023-01-13 DIAGNOSIS — Z888 Allergy status to other drugs, medicaments and biological substances status: Secondary | ICD-10-CM

## 2023-01-13 DIAGNOSIS — Z7984 Long term (current) use of oral hypoglycemic drugs: Secondary | ICD-10-CM | POA: Diagnosis not present

## 2023-01-13 DIAGNOSIS — K5730 Diverticulosis of large intestine without perforation or abscess without bleeding: Secondary | ICD-10-CM | POA: Diagnosis not present

## 2023-01-13 DIAGNOSIS — K219 Gastro-esophageal reflux disease without esophagitis: Secondary | ICD-10-CM | POA: Diagnosis present

## 2023-01-13 DIAGNOSIS — E1165 Type 2 diabetes mellitus with hyperglycemia: Secondary | ICD-10-CM | POA: Diagnosis present

## 2023-01-13 DIAGNOSIS — Z820 Family history of epilepsy and other diseases of the nervous system: Secondary | ICD-10-CM

## 2023-01-13 DIAGNOSIS — Z789 Other specified health status: Secondary | ICD-10-CM | POA: Diagnosis present

## 2023-01-13 DIAGNOSIS — E785 Hyperlipidemia, unspecified: Secondary | ICD-10-CM | POA: Diagnosis not present

## 2023-01-13 DIAGNOSIS — Z833 Family history of diabetes mellitus: Secondary | ICD-10-CM

## 2023-01-13 DIAGNOSIS — T184XXA Foreign body in colon, initial encounter: Secondary | ICD-10-CM | POA: Diagnosis present

## 2023-01-13 DIAGNOSIS — E119 Type 2 diabetes mellitus without complications: Secondary | ICD-10-CM | POA: Diagnosis not present

## 2023-01-13 DIAGNOSIS — Z91013 Allergy to seafood: Secondary | ICD-10-CM

## 2023-01-13 DIAGNOSIS — Z79899 Other long term (current) drug therapy: Secondary | ICD-10-CM

## 2023-01-13 DIAGNOSIS — E7800 Pure hypercholesterolemia, unspecified: Secondary | ICD-10-CM | POA: Diagnosis present

## 2023-01-13 DIAGNOSIS — E876 Hypokalemia: Secondary | ICD-10-CM | POA: Diagnosis present

## 2023-01-13 DIAGNOSIS — F1090 Alcohol use, unspecified, uncomplicated: Secondary | ICD-10-CM | POA: Diagnosis present

## 2023-01-13 DIAGNOSIS — D62 Acute posthemorrhagic anemia: Secondary | ICD-10-CM | POA: Diagnosis present

## 2023-01-13 DIAGNOSIS — I10 Essential (primary) hypertension: Secondary | ICD-10-CM | POA: Diagnosis not present

## 2023-01-13 DIAGNOSIS — I119 Hypertensive heart disease without heart failure: Secondary | ICD-10-CM | POA: Diagnosis present

## 2023-01-13 DIAGNOSIS — K625 Hemorrhage of anus and rectum: Secondary | ICD-10-CM | POA: Diagnosis present

## 2023-01-13 DIAGNOSIS — R710 Precipitous drop in hematocrit: Secondary | ICD-10-CM | POA: Diagnosis not present

## 2023-01-13 DIAGNOSIS — Z794 Long term (current) use of insulin: Secondary | ICD-10-CM

## 2023-01-13 DIAGNOSIS — W449XXA Unspecified foreign body entering into or through a natural orifice, initial encounter: Secondary | ICD-10-CM | POA: Diagnosis present

## 2023-01-13 DIAGNOSIS — R933 Abnormal findings on diagnostic imaging of other parts of digestive tract: Secondary | ICD-10-CM | POA: Diagnosis not present

## 2023-01-13 DIAGNOSIS — N529 Male erectile dysfunction, unspecified: Secondary | ICD-10-CM | POA: Diagnosis present

## 2023-01-13 DIAGNOSIS — I5A Non-ischemic myocardial injury (non-traumatic): Secondary | ICD-10-CM | POA: Diagnosis present

## 2023-01-13 DIAGNOSIS — E871 Hypo-osmolality and hyponatremia: Secondary | ICD-10-CM | POA: Diagnosis present

## 2023-01-13 DIAGNOSIS — D500 Iron deficiency anemia secondary to blood loss (chronic): Secondary | ICD-10-CM | POA: Diagnosis present

## 2023-01-13 LAB — TROPONIN I (HIGH SENSITIVITY)
Troponin I (High Sensitivity): 10 ng/L (ref <18)
Troponin I (High Sensitivity): 24 ng/L — ABNORMAL HIGH (ref <18)

## 2023-01-13 LAB — COMPREHENSIVE METABOLIC PANEL
ALT: 16 U/L (ref 0–44)
AST: 16 U/L (ref 15–41)
Albumin: 3.5 g/dL (ref 3.5–5.0)
Alkaline Phosphatase: 62 U/L (ref 38–126)
Anion gap: 13 (ref 5–15)
BUN: 24 mg/dL — ABNORMAL HIGH (ref 8–23)
CO2: 23 mmol/L (ref 22–32)
Calcium: 8.8 mg/dL — ABNORMAL LOW (ref 8.9–10.3)
Chloride: 92 mmol/L — ABNORMAL LOW (ref 98–111)
Creatinine, Ser: 1.53 mg/dL — ABNORMAL HIGH (ref 0.61–1.24)
GFR, Estimated: 51 mL/min — ABNORMAL LOW (ref >60)
Glucose, Bld: 482 mg/dL — ABNORMAL HIGH (ref 70–99)
Potassium: 2.9 mmol/L — ABNORMAL LOW (ref 3.5–5.1)
Sodium: 128 mmol/L — ABNORMAL LOW (ref 135–145)
Total Bilirubin: 0.5 mg/dL (ref <1.2)
Total Protein: 6.4 g/dL — ABNORMAL LOW (ref 6.5–8.1)

## 2023-01-13 LAB — RETICULOCYTES
Immature Retic Fract: 13.8 % (ref 2.3–15.9)
RBC.: 3.38 MIL/uL — ABNORMAL LOW (ref 4.22–5.81)
Retic Count, Absolute: 76.7 10*3/uL (ref 19.0–186.0)
Retic Ct Pct: 2.3 % (ref 0.4–3.1)

## 2023-01-13 LAB — CBC
HCT: 28.6 % — ABNORMAL LOW (ref 39.0–52.0)
Hemoglobin: 9.6 g/dL — ABNORMAL LOW (ref 13.0–17.0)
MCH: 28.5 pg (ref 26.0–34.0)
MCHC: 33.6 g/dL (ref 30.0–36.0)
MCV: 84.9 fL (ref 80.0–100.0)
Platelets: 243 10*3/uL (ref 150–400)
RBC: 3.37 MIL/uL — ABNORMAL LOW (ref 4.22–5.81)
RDW: 12.1 % (ref 11.5–15.5)
WBC: 8.4 10*3/uL (ref 4.0–10.5)
nRBC: 0 % (ref 0.0–0.2)

## 2023-01-13 LAB — CBG MONITORING, ED: Glucose-Capillary: 303 mg/dL — ABNORMAL HIGH (ref 70–99)

## 2023-01-13 LAB — PHOSPHORUS: Phosphorus: 3.3 mg/dL (ref 2.5–4.6)

## 2023-01-13 MED ORDER — POTASSIUM CHLORIDE 10 MEQ/100ML IV SOLN
10.0000 meq | INTRAVENOUS | Status: AC
  Administered 2023-01-14 (×2): 10 meq via INTRAVENOUS
  Filled 2023-01-13: qty 100

## 2023-01-13 MED ORDER — SODIUM CHLORIDE 0.9 % IV SOLN: INTRAVENOUS | Status: DC

## 2023-01-13 MED ORDER — HYDRALAZINE HCL 20 MG/ML IJ SOLN: 5.0000 mg | INTRAMUSCULAR | Status: DC | PRN

## 2023-01-13 MED ORDER — SODIUM CHLORIDE 0.9 % IV BOLUS
1000.0000 mL | Freq: Once | INTRAVENOUS | Status: AC
  Administered 2023-01-13: 1000 mL via INTRAVENOUS

## 2023-01-13 MED ORDER — SODIUM CHLORIDE 1 G PO TABS: 1.0000 g | ORAL_TABLET | Freq: Two times a day (BID) | ORAL | Status: DC

## 2023-01-13 MED ORDER — MORPHINE SULFATE (PF) 2 MG/ML IV SOLN: 2.0000 mg | INTRAVENOUS | Status: DC | PRN

## 2023-01-13 MED ORDER — INSULIN ASPART 100 UNIT/ML IJ SOLN
0.0000 [IU] | Freq: Every day | INTRAMUSCULAR | Status: DC
  Administered 2023-01-14 – 2023-01-15 (×2): 3 [IU] via SUBCUTANEOUS
  Filled 2023-01-13 (×2): qty 1

## 2023-01-13 MED ORDER — SODIUM CHLORIDE 0.9 % IV BOLUS
1000.0000 mL | Freq: Once | INTRAVENOUS | Status: AC
  Administered 2023-01-14: 1000 mL via INTRAVENOUS

## 2023-01-13 MED ORDER — PANTOPRAZOLE SODIUM 40 MG IV SOLR
40.0000 mg | Freq: Two times a day (BID) | INTRAVENOUS | Status: DC
  Administered 2023-01-14 – 2023-01-16 (×6): 40 mg via INTRAVENOUS
  Filled 2023-01-13 (×6): qty 10

## 2023-01-13 MED ORDER — INSULIN ASPART 100 UNIT/ML IJ SOLN
0.0000 [IU] | Freq: Three times a day (TID) | INTRAMUSCULAR | Status: DC
  Administered 2023-01-14 – 2023-01-16 (×4): 3 [IU] via SUBCUTANEOUS
  Filled 2023-01-13 (×5): qty 1

## 2023-01-13 MED ORDER — IOHEXOL 350 MG/ML SOLN
100.0000 mL | Freq: Once | INTRAVENOUS | Status: AC | PRN
  Administered 2023-01-13: 100 mL via INTRAVENOUS

## 2023-01-13 MED ORDER — POTASSIUM CHLORIDE CRYS ER 20 MEQ PO TBCR
40.0000 meq | EXTENDED_RELEASE_TABLET | Freq: Once | ORAL | Status: AC
  Administered 2023-01-13: 40 meq via ORAL
  Filled 2023-01-13: qty 2

## 2023-01-13 MED ORDER — ACETAMINOPHEN 325 MG PO TABS: 650.0000 mg | ORAL_TABLET | Freq: Four times a day (QID) | ORAL | Status: DC | PRN

## 2023-01-13 MED ORDER — ONDANSETRON HCL 4 MG/2ML IJ SOLN: 4.0000 mg | Freq: Three times a day (TID) | INTRAMUSCULAR | Status: DC | PRN

## 2023-01-13 MED ORDER — ALBUTEROL SULFATE (2.5 MG/3ML) 0.083% IN NEBU: 2.5000 mg | INHALATION_SOLUTION | RESPIRATORY_TRACT | Status: DC | PRN

## 2023-01-13 NOTE — ED Notes (Signed)
Patient given pillow and blanket per request

## 2023-01-13 NOTE — ED Notes (Signed)
ED TO INPATIENT HANDOFF REPORT  ED Nurse Name and Phone #: Fleet Contras [phone]  S Name/Age/Gender Patrick Hawkins 62 y.o. male Room/Bed: ED11A/ED11A  Code Status   Code Status: Prior  Home/SNF/Other Home Patient oriented to: self, place, time, and situation Is this baseline? Yes   Triage Complete: Triage complete  Chief Complaint Rectal bleeding [K62.5]  Triage Note Pt reports chest tightness and rectal bleeding x 2 days. Hx of rectal bleeding d/t diverticulitis. Pt denies passage of clots, but states blood is dark red. Pt also reports weakness and pt appears SOB in triage. Pt alert and oriented. Pt speaking in full sentences.    Allergies Allergies  Allergen Reactions   Ace Inhibitors Swelling    Angioedema 08/31/2020   Cat Hair Extract Shortness Of Breath   Losartan Swelling   Shellfish Allergy Anaphylaxis and Swelling    Other reaction(s): Angioedema   Lisinopril     Level of Care/Admitting Diagnosis ED Disposition     ED Disposition  Admit   Condition  --   Comment  Hospital Area: [facility] [[id number]]  Level of Care: Telemetry Medical [104]  Covid Evaluation: Asymptomatic - no recent exposure (last 10 days) testing not required  Diagnosis: Rectal bleeding [217577]  Admitting Physician: Lorretta Harp [[id number]]  Attending Physician: Lorretta Harp [[id number]]  Certification:: I certify this patient will need inpatient services for at least 2 midnights  Expected Medical Readiness: 01/15/2023          B Medical/Surgery History Past Medical History:  Diagnosis Date   Abnormal EKG 03/29/2021   Acid reflux    Asthma    As a child   Chest pain 08/31/2020   Diabetes mellitus without complication (HCC)    Erectile dysfunction 01/28/2021   Gout    High cholesterol    Hypertension    Hypertensive urgency 03/29/2021   Past Surgical History:  Procedure Laterality Date   LEFT HEART CATH AND CORONARY ANGIOGRAPHY N/A 08/31/2020   Procedure: LEFT HEART  CATH AND CORONARY ANGIOGRAPHY;  Surgeon: Tonny Bollman, MD;  Location: [facility] INVASIVE CV LAB;  Service: Cardiovascular;  Laterality: N/A;     A IV Location/Drains/Wounds Patient Lines/Drains/Airways Status     Active Line/Drains/Airways     Name Placement date Placement time Site Days   Peripheral IV 01/13/23 18 G 1" Right Antecubital 01/13/23  2024  Antecubital  less than 1            Intake/Output Last 24 hours No intake or output data in the 24 hours ending 01/13/23 2345  Labs/Imaging Results for orders placed or performed during the hospital encounter of 01/13/23 (from the past 48 hour(s))  CBG monitoring, ED     Status: Abnormal   Collection Time: 01/13/23  8:18 PM  Result Value Ref Range   Glucose-Capillary 303 (H) 70 - 99 mg/dL    Comment: Glucose reference range applies only to samples taken after fasting for at least 8 hours.   Comment 1 Notify RN    Comment 2 Document in Chart   Type and screen [facility]     Status: None   Collection Time: 01/13/23  8:27 PM  Result Value Ref Range   ABO/RH(D) O POS    Antibody Screen NEG    Sample Expiration      01/16/2023,2359 Performed at [facility], [address] [place], [place] [address]   Reticulocytes     Status: Abnormal   Collection Time: 01/13/23  8:27 PM  Result Value Ref Range   Retic Ct Pct 2.3 0.4 - 3.1 %   RBC. 3.38 (L) 4.22 - 5.81 MIL/uL   Retic Count, Absolute 76.7 19.0 - 186.0 K/uL   Immature Retic Fract 13.8 2.3 - 15.9 %    Comment: Performed at [facility], [address] [place], [place] [address]  CBC     Status: Abnormal   Collection Time: 01/13/23  8:28 PM  Result Value Ref Range   WBC 8.4 4.0 - 10.5 K/uL   RBC 3.37 (L) 4.22 - 5.81 MIL/uL   Hemoglobin 9.6 (L) 13.0 - 17.0 g/dL   HCT 19.1 (L) 47.8 - 29.5 %   MCV 84.9 80.0 - 100.0 fL   MCH 28.5 26.0 - 34.0 pg   MCHC 33.6 30.0 - 36.0 g/dL   RDW 62.1 30.8 - 65.7 %   Platelets 243 150 - 400 K/uL    nRBC 0.0 0.0 - 0.2 %    Comment: Performed at [facility], [address], [place], [place] [address]  Troponin I (High Sensitivity)     Status: None   Collection Time: 01/13/23  8:28 PM  Result Value Ref Range   Troponin I (High Sensitivity) 10 <18 ng/L    Comment: (NOTE) Elevated high sensitivity troponin I (hsTnI) values and significant  changes across serial measurements may suggest ACS but many other  chronic and acute conditions are known to elevate hsTnI results.  Refer to the "Links" section for chest pain algorithms and additional  guidance. Performed at [facility], [address] [place], [place] [address]   Comprehensive metabolic panel     Status: Abnormal   Collection Time: 01/13/23  8:28 PM  Result Value Ref Range   Sodium 128 (L) 135 - 145 mmol/L   Potassium 2.9 (L) 3.5 - 5.1 mmol/L   Chloride 92 (L) 98 - 111 mmol/L   CO2 23 22 - 32 mmol/L   Glucose, Bld 482 (H) 70 - 99 mg/dL    Comment: Glucose reference range applies only to samples taken after fasting for at least 8 hours.   BUN 24 (H) 8 - 23 mg/dL   Creatinine, Ser 4.13 (H) 0.61 - 1.24 mg/dL   Calcium 8.8 (L) 8.9 - 10.3 mg/dL   Total Protein 6.4 (L) 6.5 - 8.1 g/dL   Albumin 3.5 3.5 - 5.0 g/dL   AST 16 15 - 41 U/L   ALT 16 0 - 44 U/L   Alkaline Phosphatase 62 38 - 126 U/L   Total Bilirubin 0.5 <1.2 mg/dL   GFR, Estimated 51 (L) >60 mL/min    Comment: (NOTE) Calculated using the CKD-EPI Creatinine Equation (2021)    Anion gap 13 5 - 15    Comment: Performed at [facility], [address] [place], [place] [address]  Troponin I (High Sensitivity)     Status: Abnormal   Collection Time: 01/13/23 10:29 PM  Result Value Ref Range   Troponin I (High Sensitivity) 24 (H) <18 ng/L    Comment: (NOTE) Elevated high sensitivity troponin I (hsTnI) values and significant  changes across serial measurements may suggest ACS but many other  chronic and acute conditions are known to  elevate hsTnI results.  Refer to the "Links" section for chest pain algorithms and additional  guidance. Performed at [facility], [address] [place], [place] [address]    CT ANGIO GI BLEED  Result Date: 01/13/2023 CLINICAL DATA:  Lower  abdominal pain.  GI bleed. EXAM: CTA ABDOMEN AND PELVIS WITHOUT AND WITH CONTRAST TECHNIQUE: Multidetector CT imaging of the abdomen and pelvis was performed using the standard protocol during bolus administration of intravenous contrast. Multiplanar reconstructed images and MIPs were obtained and reviewed to evaluate the vascular anatomy. RADIATION DOSE REDUCTION: This exam was performed according to the departmental dose-optimization program which includes automated exposure control, adjustment of the mA and/or kV according to patient size and/or use of iterative reconstruction technique. CONTRAST:  OMNIPAQUE IOHEXOL 350 MG/ML SOLN COMPARISON:  None Available. FINDINGS: VASCULAR Aorta: Normal caliber aorta without aneurysm, dissection, vasculitis or significant stenosis. Celiac: Patent without evidence of aneurysm, dissection, vasculitis or significant stenosis. SMA: Patent without evidence of aneurysm, dissection, vasculitis or significant stenosis. Renals: Both renal arteries are patent without evidence of aneurysm, dissection, vasculitis, fibromuscular dysplasia or significant stenosis. IMA: Patent without evidence of aneurysm, dissection, vasculitis or significant stenosis. Inflow: Patent without evidence of aneurysm, dissection, vasculitis or significant stenosis. Proximal Outflow: Bilateral common femoral and visualized portions of the superficial and profunda femoral arteries are patent without evidence of aneurysm, dissection, vasculitis or significant stenosis. Veins: The IVC is unremarkable. The SMV, splenic vein, and main portal vein are patent. No portal venous gas. Review of the MIP images confirms the above findings. NON-VASCULAR Lower  chest: The visualized lung bases are clear. No intra-abdominal free air or free fluid. Hepatobiliary: The liver is unremarkable. No biliary ductal dilatation. The gallbladder is unremarkable Pancreas: Unremarkable. No pancreatic ductal dilatation or surrounding inflammatory changes. Spleen: Normal in size without focal abnormality. Adrenals/Urinary Tract: The adrenal glands are unremarkable. There is no hydronephrosis or nephrolithiasis on either side. Subcentimeter right renal interpolar hypodense lesions are too small to characterize. There is symmetric enhancement of the renal parenchyma bilaterally. The visualized ureters and urinary bladder appear unremarkable. Stomach/Bowel: Moderate size hiatal hernia. There is an approximately 2.5 cm linear radiopaque foreign object in the distal descending colon extending outside of the confines of the colonic wall. This may represent an ingested toothpick or a bone. There is associated inflammatory changes. No evidence of active bleed. There is colonic diverticulosis without active inflammatory changes. There is no bowel obstruction. The appendix is normal. Lymphatic: No adenopathy. Reproductive: The prostate and seminal vesicles are grossly unremarkable no pelvic mass. Other: None Musculoskeletal: No acute osseous pathology. IMPRESSION: 1. No evidence of active GI bleed. 2. Linear radiopaque foreign object in the distal descending colon extending outside of the bowel wall. No intra-abdominal free air or free fluid. 3. Colonic diverticulosis. No bowel obstruction. Normal appendix. 4. Moderate size hiatal hernia. Electronically Signed   By: Elgie Collard M.D.   On: 01/13/2023 22:26   DG Chest Port 1 View  Result Date: 01/13/2023 CLINICAL DATA:  Chest tightness and rectal bleeding x2 days. EXAM: PORTABLE CHEST 1 VIEW COMPARISON:  June 02, 2021 FINDINGS: The heart size and mediastinal contours are within normal limits. Both lungs are clear. The visualized skeletal  structures are unremarkable. IMPRESSION: No active disease. Electronically Signed   By: Aram Candela M.D.   On: 01/13/2023 21:11    Pending Labs Unresulted Labs (From admission, onward)     Start     Ordered   01/14/23 0300  CBC  Now then every 6 hours,   TIMED      01/13/23 2331   01/13/23 2330  Phosphorus  Add-on,   AD        01/13/23 2329   01/13/23 2330  Vitamin B12  (  Anemia Panel (PNL))  Add-on,   AD        01/13/23 2329   01/13/23 2330  Folate  (Anemia Panel (PNL))  Add-on,   AD        01/13/23 2329   01/13/23 2330  Iron and TIBC  (Anemia Panel (PNL))  Add-on,   AD        01/13/23 2329   01/13/23 2330  Ferritin  (Anemia Panel (PNL))  Add-on,   AD        01/13/23 2329   01/13/23 2329  Magnesium  Add-on,   AD        01/13/23 2329            Vitals/Pain Today's Vitals   01/13/23 2018 01/13/23 2145 01/13/23 2230 01/13/23 2300  BP: 95/81 126/75 123/66 (!) 143/81  Pulse: (!) 118 100 (!) 105 93  Resp: (!) 22 14 16 18   SpO2: 100% 100% 100% 98%  Weight:      Height:      PainSc:        Isolation Precautions No active isolations  Medications Medications  sodium chloride 0.9 % bolus 1,000 mL (has no administration in time range)  0.9 %  sodium chloride infusion (has no administration in time range)  insulin aspart (novoLOG) injection 0-5 Units (has no administration in time range)  insulin aspart (novoLOG) injection 0-9 Units (has no administration in time range)  ondansetron (ZOFRAN) injection 4 mg (has no administration in time range)  pantoprazole (PROTONIX) injection 40 mg (has no administration in time range)  potassium chloride 10 mEq in 100 mL IVPB (has no administration in time range)  morphine (PF) 2 MG/ML injection 2 mg (has no administration in time range)  acetaminophen (TYLENOL) tablet 650 mg (has no administration in time range)  hydrALAZINE (APRESOLINE) injection 5 mg (has no administration in time range)  albuterol (VENTOLIN HFA) 108 (90 Base)  MCG/ACT inhaler 2 puff (has no administration in time range)  iohexol (OMNIPAQUE) 350 MG/ML injection 100 mL (100 mLs Intravenous Contrast Given 01/13/23 2122)  sodium chloride 0.9 % bolus 1,000 mL (1,000 mLs Intravenous New Bag/Given 01/13/23 2243)  potassium chloride SA (KLOR-CON M) CR tablet 40 mEq (40 mEq Oral Given 01/13/23 2243)    Mobility walks        R Recommendations: See Admitting Provider Note  Report given to:   Additional Notes:

## 2023-01-13 NOTE — ED Triage Notes (Signed)
Pt reports chest tightness and rectal bleeding x 2 days. Hx of rectal bleeding d/t diverticulitis. Pt denies passage of clots, but states blood is dark red. Pt also reports weakness and pt appears SOB in triage. Pt alert and oriented. Pt speaking in full sentences.

## 2023-01-13 NOTE — Progress Notes (Signed)
62 yo male DM, HTN presents w SOB, chest pain and rectal bleeding. W/U so far shows hyperosmolar state with mild anemia and AKI. CT pers reviewed showing thin radiopaque FB descending colon, no free air, some mild inflammatory changes w/o abscess. Per Dr Anner Crete not peritonitic and apparently has colonoscopies in the past. I would manage w antibiotics, resuscitation and correction of hyperosmolar state,correction of lytes and serial exams. No need for surgical intervention based on my assessment and Dr. Anner Crete' Report.

## 2023-01-13 NOTE — ED Provider Notes (Signed)
[facility] Provider Note    Event Date/Time   First MD Initiated Contact with Patient 01/13/23 2027     (approximate)   History   Chest Pain and Rectal Bleeding   HPI Patrick Hawkins is a 62 y.o. male with history of HTN, DM2 presenting today for rectal bleeding.  Patient states he has noted bleeding in his stools for the past 2 days.  Initially was bright red and then became darker.  He is not sure if he seen any melanotic stools.  Notes recently with increased weakness and shortness of breath with ambulating.  Has had some chest tightness as well.  Notes prior history of diverticulitis with rectal bleeding.  Has had prior colonoscopies and EGD which were reassuring.  Denies any abdominal pain or vomiting at this time.     Physical Exam   Triage Vital Signs: ED Triage Vitals  Encounter Vitals Group     BP 01/13/23 2018 95/81     Systolic BP Percentile --      Diastolic BP Percentile --      Pulse Rate 01/13/23 2018 (!) 118     Resp 01/13/23 2018 (!) 22     Temp --      Temp src --      SpO2 01/13/23 2018 100 %     Weight 01/13/23 2013 170 lb (77.1 kg)     Height 01/13/23 2013 5\' 10"  (1.778 m)     Head Circumference --      Peak Flow --      Pain Score 01/13/23 2013 5     Pain Loc --      Pain Education --      Exclude from Growth Chart --     Most recent vital signs: Vitals:   01/13/23 2230 01/13/23 2300  BP: 123/66 (!) 143/81  Pulse: (!) 105 93  Resp: 16 18  SpO2: 100% 98%   Physical Exam: I have reviewed the vital signs and nursing notes. General: Awake, alert, no acute distress.  Nontoxic appearing. Head:  Atraumatic, normocephalic.   ENT:  EOM intact, PERRL. Oral mucosa is pink and moist with no lesions. Neck: Neck is supple with full range of motion, No meningeal signs. Cardiovascular:  tachycardic, RR, No murmurs. Peripheral pulses palpable and equal bilaterally. Respiratory:  Symmetrical chest wall expansion.  No rhonchi,  rales, or wheezes.  Good air movement throughout.  No use of accessory muscles.   Musculoskeletal:  No cyanosis or edema. Moving extremities with full ROM Abdomen:  Soft, nontender, nondistended. Rectal: Scant blood noted but no melanotic stools.  No gross blood or ongoing bleeding Neuro:  GCS 15, moving all four extremities, interacting appropriately. Speech clear. Psych:  Calm, appropriate.   Skin:  Warm, dry, no rash.    ED Results / Procedures / Treatments   Labs (all labs ordered are listed, but only abnormal results are displayed) Labs Reviewed  CBC - Abnormal; Notable for the following components:      Result Value   RBC 3.37 (*)    Hemoglobin 9.6 (*)    HCT 28.6 (*)    All other components within normal limits  COMPREHENSIVE METABOLIC PANEL - Abnormal; Notable for the following components:   Sodium 128 (*)    Potassium 2.9 (*)    Chloride 92 (*)    Glucose, Bld 482 (*)    BUN 24 (*)    Creatinine, Ser 1.53 (*)    Calcium  8.8 (*)    Total Protein 6.4 (*)    GFR, Estimated 51 (*)    All other components within normal limits  CBG MONITORING, ED - Abnormal; Notable for the following components:   Glucose-Capillary 303 (*)    All other components within normal limits  TROPONIN I (HIGH SENSITIVITY) - Abnormal; Notable for the following components:   Troponin I (High Sensitivity) 24 (*)    All other components within normal limits  MAGNESIUM  PHOSPHORUS  VITAMIN B12  FOLATE  IRON AND TIBC  FERRITIN  RETICULOCYTES  CBC  CBC  TYPE AND SCREEN  TROPONIN I (HIGH SENSITIVITY)     EKG My EKG interpretation: Rate of 120, sinus tachycardia, no acute ST elevations or depressions   RADIOLOGY Inability interpreted CT abdomen/pelvis showing evidence of foreign body in the distal colonic wall   PROCEDURES:  Critical Care performed: No  Procedures   MEDICATIONS ORDERED IN ED: Medications  sodium chloride 0.9 % bolus 1,000 mL (has no administration in time range)   0.9 %  sodium chloride infusion (has no administration in time range)  insulin aspart (novoLOG) injection 0-5 Units (has no administration in time range)  insulin aspart (novoLOG) injection 0-9 Units (has no administration in time range)  ondansetron (ZOFRAN) injection 4 mg (has no administration in time range)  pantoprazole (PROTONIX) injection 40 mg (has no administration in time range)  potassium chloride 10 mEq in 100 mL IVPB (has no administration in time range)  morphine (PF) 2 MG/ML injection 2 mg (has no administration in time range)  acetaminophen (TYLENOL) tablet 650 mg (has no administration in time range)  hydrALAZINE (APRESOLINE) injection 5 mg (has no administration in time range)  albuterol (VENTOLIN HFA) 108 (90 Base) MCG/ACT inhaler 2 puff (has no administration in time range)  iohexol (OMNIPAQUE) 350 MG/ML injection 100 mL (100 mLs Intravenous Contrast Given 01/13/23 2122)  sodium chloride 0.9 % bolus 1,000 mL (1,000 mLs Intravenous New Bag/Given 01/13/23 2243)  potassium chloride SA (KLOR-CON M) CR tablet 40 mEq (40 mEq Oral Given 01/13/23 2243)     IMPRESSION / MDM / ASSESSMENT AND PLAN / ED COURSE  I reviewed the triage vital signs and the nursing notes.                              Differential diagnosis includes, but is not limited to, diverticulitis, gastritis, upper GI bleed, lower GI bleed, hemorrhoids  Patient's presentation is most consistent with acute complicated illness / injury requiring diagnostic workup.  Patient is a 62 year old male presenting today for rectal bleeding for 36 hours.  Nontender abdomen but notably tachycardic on exam.  Hemoglobin found to be 9.6 which is a 4 point drop from 3 months ago.  Also found to have an AKI with some hypokalemia.  Patient given 1 L of fluids and potassium repletion.  CT abdomen/pelvis was ordered for further evaluation.  This shows evidence of foreign body that is protruding through the distal descending colonic wall.   No obvious perforation or other free air or fluid.  Discussed the case initially with GI who recommends no emergent colonoscopy at this time.  They recommended consultation with general surgery.  Spoke with general surgery, who stated without perforation or abscess, no emergent surgery required.  They recommend admission overnight for continued monitoring and reassessment in the morning.  Patient admitted to hospitalist for further care.  The patient is on the cardiac  monitor to evaluate for evidence of arrhythmia and/or significant heart rate changes. Clinical Course as of 01/13/23 2336  Tue Jan 13, 2023  2107 Comprehensive metabolic panel(!) New AKI.  Hyperglycemia without DKA.  Hypokalemia present as well. [DW]  2107 Hemoglobin(!): 9.6 Hemoglobin drop of 4 from 3 months ago [DW]  2243 Rectal exam with scant amount of gross blood but no obvious melanotic stools [DW]  2252 GI - no emergency colonoscopy needed with no active extravasation seen. Recommends consulting with general surgery [DW]  2258 Surgery - admit, monitoring overnight. No additional workup needed. Will re-evaluate tomorrow. [DW]    Clinical Course User Index [DW] Janith Lima, MD     FINAL CLINICAL IMPRESSION(S) / ED DIAGNOSES   Final diagnoses:  Foreign body in descending colon  Rectal bleeding  Acute blood loss anemia     Rx / DC Orders   ED Discharge Orders     None        Note:  This document was prepared using Dragon voice recognition software and may include unintentional dictation errors.   Janith Lima, MD 01/13/23 [phone]

## 2023-01-13 NOTE — ED Notes (Signed)
Patient to CT.
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
## 2023-01-14 ENCOUNTER — Inpatient Hospital Stay: Payer: 59

## 2023-01-14 DIAGNOSIS — E871 Hypo-osmolality and hyponatremia: Secondary | ICD-10-CM | POA: Diagnosis present

## 2023-01-14 DIAGNOSIS — E876 Hypokalemia: Secondary | ICD-10-CM | POA: Diagnosis present

## 2023-01-14 DIAGNOSIS — D62 Acute posthemorrhagic anemia: Secondary | ICD-10-CM | POA: Diagnosis present

## 2023-01-14 DIAGNOSIS — K5730 Diverticulosis of large intestine without perforation or abscess without bleeding: Secondary | ICD-10-CM | POA: Diagnosis not present

## 2023-01-14 DIAGNOSIS — T184XXA Foreign body in colon, initial encounter: Secondary | ICD-10-CM | POA: Diagnosis not present

## 2023-01-14 DIAGNOSIS — R933 Abnormal findings on diagnostic imaging of other parts of digestive tract: Secondary | ICD-10-CM

## 2023-01-14 DIAGNOSIS — K625 Hemorrhage of anus and rectum: Secondary | ICD-10-CM | POA: Diagnosis not present

## 2023-01-14 DIAGNOSIS — R710 Precipitous drop in hematocrit: Secondary | ICD-10-CM

## 2023-01-14 DIAGNOSIS — I5A Non-ischemic myocardial injury (non-traumatic): Secondary | ICD-10-CM | POA: Diagnosis present

## 2023-01-14 DIAGNOSIS — E119 Type 2 diabetes mellitus without complications: Secondary | ICD-10-CM

## 2023-01-14 DIAGNOSIS — D500 Iron deficiency anemia secondary to blood loss (chronic): Secondary | ICD-10-CM | POA: Diagnosis present

## 2023-01-14 HISTORY — DX: Foreign body in colon, initial encounter: T18.4XXA

## 2023-01-14 LAB — VITAMIN B12: Vitamin B-12: 275 pg/mL (ref 180–914)

## 2023-01-14 LAB — GLUCOSE, CAPILLARY
Glucose-Capillary: 290 mg/dL — ABNORMAL HIGH (ref 70–99)
Glucose-Capillary: 211 mg/dL — ABNORMAL HIGH (ref 70–99)
Glucose-Capillary: 228 mg/dL — ABNORMAL HIGH (ref 70–99)
Glucose-Capillary: 145 mg/dL — ABNORMAL HIGH (ref 70–99)

## 2023-01-14 LAB — HEMOGLOBIN A1C
Hgb A1c MFr Bld: 10.6 % — ABNORMAL HIGH (ref 4.8–5.6)
Mean Plasma Glucose: 257.52 mg/dL

## 2023-01-14 LAB — PROTIME-INR
INR: 1.1 (ref 0.8–1.2)
Prothrombin Time: 14.5 s (ref 11.4–15.2)

## 2023-01-14 LAB — ABO/RH: ABO/RH(D): O POS

## 2023-01-14 LAB — LIPID PANEL
Cholesterol: 133 mg/dL (ref 0–200)
HDL: 28 mg/dL — ABNORMAL LOW (ref >40)
LDL Cholesterol: 51 mg/dL (ref 0–99)
Total CHOL/HDL Ratio: 4.8 {ratio}
Triglycerides: 270 mg/dL — ABNORMAL HIGH (ref <150)
VLDL: 54 mg/dL — ABNORMAL HIGH (ref 0–40)

## 2023-01-14 LAB — FOLATE: Folate: 27 ng/mL (ref >5.9)

## 2023-01-14 LAB — CBC
HCT: 20 % — ABNORMAL LOW (ref 39.0–52.0)
HCT: 19.6 % — ABNORMAL LOW (ref 39.0–52.0)
Hemoglobin: 7 g/dL — ABNORMAL LOW (ref 13.0–17.0)
Hemoglobin: 6.7 g/dL — ABNORMAL LOW (ref 13.0–17.0)
MCH: 28.8 pg (ref 26.0–34.0)
MCH: 28.5 pg (ref 26.0–34.0)
MCHC: 35 g/dL (ref 30.0–36.0)
MCHC: 34.2 g/dL (ref 30.0–36.0)
MCV: 82.3 fL (ref 80.0–100.0)
MCV: 83.4 fL (ref 80.0–100.0)
Platelets: 186 10*3/uL (ref 150–400)
Platelets: 167 10*3/uL (ref 150–400)
RBC: 2.43 MIL/uL — ABNORMAL LOW (ref 4.22–5.81)
RBC: 2.35 MIL/uL — ABNORMAL LOW (ref 4.22–5.81)
RDW: 11.9 % (ref 11.5–15.5)
RDW: 12.1 % (ref 11.5–15.5)
WBC: 7 10*3/uL (ref 4.0–10.5)
WBC: 6.3 10*3/uL (ref 4.0–10.5)
nRBC: 0 % (ref 0.0–0.2)
nRBC: 0 % (ref 0.0–0.2)

## 2023-01-14 LAB — BASIC METABOLIC PANEL
Anion gap: 5 (ref 5–15)
BUN: 19 mg/dL (ref 8–23)
CO2: 25 mmol/L (ref 22–32)
Calcium: 7.6 mg/dL — ABNORMAL LOW (ref 8.9–10.3)
Chloride: 101 mmol/L (ref 98–111)
Creatinine, Ser: 1.05 mg/dL (ref 0.61–1.24)
GFR, Estimated: >60 mL/min (ref >60)
Glucose, Bld: 296 mg/dL — ABNORMAL HIGH (ref 70–99)
Potassium: 3.6 mmol/L (ref 3.5–5.1)
Sodium: 131 mmol/L — ABNORMAL LOW (ref 135–145)

## 2023-01-14 LAB — TROPONIN I (HIGH SENSITIVITY): Troponin I (High Sensitivity): 29 ng/L — ABNORMAL HIGH (ref <18)

## 2023-01-14 LAB — HIV ANTIBODY (ROUTINE TESTING W REFLEX): HIV Screen 4th Generation wRfx: NONREACTIVE

## 2023-01-14 LAB — APTT: aPTT: 27 s (ref 24–36)

## 2023-01-14 LAB — MAGNESIUM: Magnesium: 1.7 mg/dL (ref 1.7–2.4)

## 2023-01-14 LAB — HEMOGLOBIN: Hemoglobin: 8.2 g/dL — ABNORMAL LOW (ref 13.0–17.0)

## 2023-01-14 LAB — IRON AND TIBC
Iron: 70 ug/dL (ref 45–182)
Saturation Ratios: 21 % (ref 17.9–39.5)
TIBC: 328 ug/dL (ref 250–450)
UIBC: 258 ug/dL

## 2023-01-14 LAB — FERRITIN: Ferritin: 38 ng/mL (ref 24–336)

## 2023-01-14 LAB — PREPARE RBC (CROSSMATCH)

## 2023-01-14 MED ORDER — ATORVASTATIN CALCIUM 10 MG PO TABS
10.0000 mg | ORAL_TABLET | Freq: Every day | ORAL | Status: DC
  Administered 2023-01-14 – 2023-01-15 (×2): 10 mg via ORAL
  Filled 2023-01-14 (×2): qty 1

## 2023-01-14 MED ORDER — PIPERACILLIN-TAZOBACTAM 3.375 G IVPB
3.3750 g | Freq: Three times a day (TID) | INTRAVENOUS | Status: DC
  Administered 2023-01-14: 3.375 g via INTRAVENOUS
  Filled 2023-01-14: qty 50

## 2023-01-14 MED ORDER — LORAZEPAM 2 MG/ML IJ SOLN
0.0000 mg | Freq: Four times a day (QID) | INTRAMUSCULAR | Status: DC
  Filled 2023-01-14: qty 1

## 2023-01-14 MED ORDER — ADULT MULTIVITAMIN W/MINERALS CH
1.0000 | ORAL_TABLET | Freq: Every day | ORAL | Status: DC
  Administered 2023-01-14 – 2023-01-16 (×3): 1 via ORAL
  Filled 2023-01-14 (×3): qty 1

## 2023-01-14 MED ORDER — LORAZEPAM 2 MG/ML IJ SOLN: 0.0000 mg | Freq: Two times a day (BID) | INTRAMUSCULAR | Status: DC

## 2023-01-14 MED ORDER — OXYCODONE-ACETAMINOPHEN 5-325 MG PO TABS: 1.0000 | ORAL_TABLET | Freq: Four times a day (QID) | ORAL | Status: DC | PRN

## 2023-01-14 MED ORDER — PEG 3350-KCL-NA BICARB-NACL 420 G PO SOLR
4000.0000 mL | Freq: Once | ORAL | Status: AC
  Administered 2023-01-14: 4000 mL via ORAL
  Filled 2023-01-14: qty 4000

## 2023-01-14 MED ORDER — LORAZEPAM 1 MG PO TABS: 1.0000 mg | ORAL_TABLET | ORAL | Status: DC | PRN

## 2023-01-14 MED ORDER — INSULIN GLARGINE-YFGN 100 UNIT/ML ~~LOC~~ SOLN
5.0000 [IU] | Freq: Every day | SUBCUTANEOUS | Status: DC
  Administered 2023-01-14 – 2023-01-16 (×3): 5 [IU] via SUBCUTANEOUS
  Filled 2023-01-14 (×3): qty 0.05

## 2023-01-14 MED ORDER — THIAMINE MONONITRATE 100 MG PO TABS
100.0000 mg | ORAL_TABLET | Freq: Every day | ORAL | Status: DC
Start: 2023-01-14 — End: 2023-01-16
  Administered 2023-01-14 – 2023-01-16 (×3): 100 mg via ORAL
  Filled 2023-01-14 (×3): qty 1

## 2023-01-14 MED ORDER — FOLIC ACID 1 MG PO TABS
1.0000 mg | ORAL_TABLET | Freq: Every day | ORAL | Status: DC
Start: 2023-01-14 — End: 2023-01-16
  Administered 2023-01-14 – 2023-01-16 (×3): 1 mg via ORAL
  Filled 2023-01-14 (×3): qty 1

## 2023-01-14 MED ORDER — THIAMINE HCL 100 MG/ML IJ SOLN
100.0000 mg | Freq: Every day | INTRAMUSCULAR | Status: DC
Start: 2023-01-14 — End: 2023-01-16

## 2023-01-14 MED ORDER — SODIUM CHLORIDE 0.9% IV SOLUTION: Freq: Once | INTRAVENOUS | Status: AC

## 2023-01-14 MED ORDER — INFLUENZA VIRUS VACC SPLIT PF (FLUZONE) 0.5 ML IM SUSY: 0.5000 mL | PREFILLED_SYRINGE | INTRAMUSCULAR | Status: DC | PRN

## 2023-01-14 MED ORDER — LORAZEPAM 2 MG/ML IJ SOLN: 1.0000 mg | INTRAMUSCULAR | Status: DC | PRN

## 2023-01-14 NOTE — Plan of Care (Signed)

## 2023-01-14 NOTE — Consult Note (Signed)
[facility] SURGICAL CONSULTATION NOTE (initial) - cpt: 29562   HISTORY OF PRESENT ILLNESS (HPI):  62 y.o. male presented to [facility] ED yesterday for evaluation of GI bleeding. Patient reports that on Monday while at work he had a large bright red bloody bowel movement. He reports associated fatigue, weakness, and SOB after this. He denied any significant abdominal pain. Bloody bowel movement continued into Tuesday and he did have a syncopal episode Tuesday afternoon. No fever, chills, nausea, emesis. He denied any intentional ingestion of foreign bodies. He does report a history of diverticulitis previously. Last colonoscopy was ~5 years ago; unable to find record of this. He denied any previous abdominal surgeries.  Work up in the ED revealed a normal WBC to 8.4K, Hgb to 9.6  with previous baseline around 12-13 (was 7.0 this AM), AKI with sCr - 1.53 (now 1.05), hypokalemia to 2.9 (now 3.6). He did under CTA for GI bleeding which revealed a linear foreign body in the descending colon, no free air, and diverticulosis without diverticulitis. He was admitted to the medicine service.   Surgery is consulted by emergency medicine physician Dr. Onalee Hua Well, MD in this context for evaluation and management of descending colonic foreign body with GI Bleeding.  PAST MEDICAL HISTORY (PMH):  Past Medical History:  Diagnosis Date   Abnormal EKG 03/29/2021   Acid reflux    Asthma    As a child   Chest pain 08/31/2020   Diabetes mellitus without complication (HCC)    Erectile dysfunction 01/28/2021   Gout    High cholesterol    Hypertension    Hypertensive urgency 03/29/2021     PAST SURGICAL HISTORY [facility]):  Past Surgical History:  Procedure Laterality Date   LEFT HEART CATH AND CORONARY ANGIOGRAPHY N/A 08/31/2020   Procedure: LEFT HEART CATH AND CORONARY ANGIOGRAPHY;  Surgeon: Tonny Bollman, MD;  Location: [facility] INVASIVE CV LAB;  Service: Cardiovascular;  Laterality: N/A;      MEDICATIONS:  Prior to Admission medications   Medication Sig Start Date End Date Taking? Authorizing Provider  albuterol (VENTOLIN HFA) 108 (90 Base) MCG/ACT inhaler Inhale 2 puffs into the lungs every 6 (six) hours as needed for wheezing or shortness of breath. 08/31/20  Yes Madelyn Flavors A, MD  allopurinol (ZYLOPRIM) 300 MG tablet Take 1 tablet (300 mg total) by mouth daily. 04/17/21  Yes Rice, Jamesetta Orleans, MD  amLODipine (NORVASC) 5 MG tablet Take 1 tablet (5 mg total) by mouth daily for 180 doses. 08/18/22 02/14/23 Yes Masters, Katie, DO  diphenhydrAMINE (BENADRYL) 25 MG tablet Take 25 mg by mouth every 6 (six) hours as needed.   Yes [provider]  hydrochlorothiazide (HYDRODIURIL) 25 MG tablet Take 1 tablet (25 mg total) by mouth daily. 08/18/22  Yes Masters, Katie, DO  metFORMIN (GLUCOPHAGE) 1000 MG tablet Take 1 tablet (1,000 mg total) by mouth 2 (two) times daily with a meal. 08/18/22  Yes Masters, Katie, DO  oxyCODONE-acetaminophen (PERCOCET) 5-325 MG tablet Take 1 tablet by mouth every 6 (six) hours as needed for severe pain (pain score 7-10). 12/05/22 12/05/23 Yes Shaune Pollack, MD  aspirin EC 81 MG EC tablet Take 1 tablet (81 mg total) by mouth daily. Swallow whole. Patient not taking: Reported on 04/09/2021 03/31/21   Darlin Priestly, MD  atorvastatin (LIPITOR) 10 MG tablet Take 1 tablet (10 mg total) by mouth daily. Patient not taking: Reported on 03/30/2021 01/28/21 07/27/21  Gardenia Phlegm, MD  ofloxacin (FLOXIN) 0.3 % OTIC solution Place  10 drops into the right ear daily. Patient not taking: Reported on 01/14/2023 11/18/22   Valinda Hoar, NP  sildenafil (VIAGRA) 50 MG tablet TAKE 1 TABLET BY MOUTH ONCE DAILY AS NEEDED 1  HOUR  BEFORE  SEXUAL  ACTIVITY  FOR  ERECTILE  DYSFUNCTION 12/22/22   Masters, Florentina Addison, DO  traZODone (DESYREL) 50 MG tablet Take 1 tablet (50 mg total) by mouth at bedtime. 05/30/19 03/08/20  Merrilee Jansky, MD     ALLERGIES:  Allergies  Allergen Reactions    Ace Inhibitors Swelling    Angioedema 08/31/2020   Cat Hair Extract Shortness Of Breath   Losartan Swelling   Shellfish Allergy Anaphylaxis and Swelling    Other reaction(s): Angioedema   Lisinopril      SOCIAL HISTORY:  Social History   Socioeconomic History   Marital status: Single    Spouse name: Not on file   Number of children: Not on file   Years of education: Not on file   Highest education level: Not on file  Occupational History   Not on file  Tobacco Use   Smoking status: Former    Types: Cigars   Smokeless tobacco: Never  Vaping Use   Vaping status: Never Used  Substance and Sexual Activity   Alcohol use: Yes    Alcohol/week: 18.0 standard drinks of alcohol    Types: 18 Cans of beer per week   Drug use: Never   Sexual activity: Yes  Other Topics Concern   Not on file  Social History Narrative   Not on file   Social Determinants of Health   Financial Resource Strain: Not on file  Food Insecurity: No Food Insecurity (01/14/2023)   Hunger Vital Sign    Worried About Running Out of Food in the Last Year: Never true    Ran Out of Food in the Last Year: Never true  Transportation Needs: No Transportation Needs (01/14/2023)   PRAPARE - Administrator, Civil Service (Medical): No    Lack of Transportation (Non-Medical): No  Physical Activity: Not on file  Stress: Not on file  Social Connections: Not on file  Intimate Partner Violence: Not At Risk (01/14/2023)   Humiliation, Afraid, Rape, and Kick questionnaire    Fear of Current or Ex-Partner: No    Emotionally Abused: No    Physically Abused: No    Sexually Abused: No     FAMILY HISTORY:  Family History  Problem Relation Age of Onset   Diabetes Mother    Diabetes Father    Diabetes Sister    Diabetes Sister    Diabetes Brother    Parkinson's disease Brother       REVIEW OF SYSTEMS:  Review of Systems  Constitutional:  Positive for malaise/fatigue. Negative for chills and fever.   Respiratory:  Negative for cough and shortness of breath.   Cardiovascular:  Negative for chest pain and palpitations.  Gastrointestinal:  Positive for blood in stool. Negative for abdominal pain, constipation, diarrhea, nausea and vomiting.  Genitourinary:  Negative for dysuria and urgency.  Neurological:  Positive for loss of consciousness and weakness. Negative for tingling and headaches.  All other systems reviewed and are negative.   VITAL SIGNS:  Temp:  [97.7 F (36.5 C)-98.1 F (36.7 C)] 97.7 F (36.5 C) (12/04 0053) Pulse Rate:  [93-118] 108 (12/04 0053) Resp:  [14-22] 19 (12/04 0053) BP: (95-143)/(66-82) 123/82 (12/04 0053) SpO2:  [98 %-100 %] 99 % (12/04 0053) Weight:  [  77.1 kg] 77.1 kg (12/03 2013)     Height: 5\' 10"  (177.8 cm) Weight: 77.1 kg BMI (Calculated): 24.39   INTAKE/OUTPUT:  12/03 0701 - 12/04 0700 In: 2234.8 [I.V.:39.6; IV Piggyback:2195.2] Out: -   PHYSICAL EXAM:  Physical Exam Vitals and nursing note reviewed.  Constitutional:      General: He is not in acute distress.    Appearance: Normal appearance. He is normal weight. He is not ill-appearing.     Comments: Resting comfortably, NAD  HENT:     Head: Normocephalic and atraumatic.  Eyes:     General: No scleral icterus.    Conjunctiva/sclera: Conjunctivae normal.  Cardiovascular:     Rate and Rhythm: Normal rate.     Pulses: Normal pulses.     Heart sounds: No murmur heard. Pulmonary:     Effort: Pulmonary effort is normal.  Abdominal:     General: Abdomen is flat. There is no distension.     Palpations: Abdomen is soft.     Tenderness: There is no abdominal tenderness. There is no guarding or rebound.     Comments: Abdomen is soft, non-tender, non-distended, no rebound/guarding   Genitourinary:    Comments: Deferred Musculoskeletal:     Right lower leg: No edema.     Left lower leg: No edema.  Skin:    General: Skin is warm and dry.     Coloration: Skin is not pale.  Neurological:      General: No focal deficit present.     Mental Status: He is alert and oriented to person, place, and time.  Psychiatric:        Mood and Affect: Mood normal.        Behavior: Behavior normal.      Labs:     Latest Ref Rng & Units 01/14/2023    3:05 AM 01/13/2023    8:28 PM 09/26/2022   10:55 AM  CBC  WBC 4.0 - 10.5 K/uL 7.0  8.4  4.4   Hemoglobin 13.0 - 17.0 g/dL 7.0  9.6  95.6   Hematocrit 39.0 - 52.0 % 20.0  28.6  40.1   Platelets 150 - 400 K/uL 186  243  235       Latest Ref Rng & Units 01/14/2023    3:05 AM 01/13/2023    8:28 PM 12/02/2022    8:13 AM  CMP  Glucose 70 - 99 mg/dL 387  564    BUN 8 - 23 mg/dL 19  24    Creatinine 3.32 - 1.24 mg/dL 9.51  8.84  1.66   Sodium 135 - 145 mmol/L 131  128    Potassium 3.5 - 5.1 mmol/L 3.6  2.9    Chloride 98 - 111 mmol/L 101  92    CO2 22 - 32 mmol/L 25  23    Calcium 8.9 - 10.3 mg/dL 7.6  8.8    Total Protein 6.5 - 8.1 g/dL  6.4    Total Bilirubin <1.2 mg/dL  0.5    Alkaline Phos 38 - 126 U/L  62    AST 15 - 41 U/L  16    ALT 0 - 44 U/L  16       Imaging studies:   CTA GI Bleed (01/13/2023) personally reviewed with noted linear foreign body in descending colon, no evidence of perforation, no diverticulitis, and radiologist report reviewed below:  IMPRESSION: 1. No evidence of active GI bleed. 2. Linear radiopaque foreign object in the distal descending  colon extending outside of the bowel wall. No intra-abdominal free air or free fluid. 3. Colonic diverticulosis. No bowel obstruction. Normal appendix. 4. Moderate size hiatal hernia.   KUB (01/14/2023) personally reviewed, no longer with linear foreign body in descending colon as seen on CT, please note metallic foreign bodies noted are the draw strings on his pants, and radiologist report reviewed below: IMPRESSION: 1. No acute findings. 2. No evidence of bowel obstruction or free air. 3. Linear foreign body noted along the distal descending colon on the CT from  yesterday is not visualized radiographically. 4. Metallic foreign bodies projecting over the rectum and medial right inguinal region.   Assessment/Plan:  62 y.o. male with GI likely secondary to foreign body in descending colon seen on admission, without evidence of perforation or peritonitis.   - Fortunately, there is no evidence of perforation, peritonitis, or clinical deterioration. We will continue to monitor with serial abdominal examinations.  - No need for emergent surgical intervention at this time  - We can do CLD only today  - Monitor H&H; will give transfusion this AM - Monitor abdominal examination; on-going bowel function   - Pain control prn; antiemetics prn   - Mobilize as tolerated  - Further management per primary service; we will follow   All of the above findings and recommendations were discussed with the patient, and all of patient's questions were answered to his expressed satisfaction.  Thank you for the opportunity to participate in this patient's care.   -- Lynden Oxford, PA-C [facility] 01/14/2023, 7:28 AM M-F: 7am - 4pm

## 2023-01-14 NOTE — Discharge Instructions (Addendum)
Tips For Adding Protein  Patients may be advised to increase the protein in their diet but not necessarily the calories as well. However, note that when adding protein to your diet, you will also be adding extra calories. The following suggestions may help add the extra protein while keeping the calories as low as possible.  Tips Add extra egg to one or more meals  Increase the portion of milk to drink and change to skim milk if able  Include [place] yogurt or cottage cheese for snack or part of a meal  Increase portion size of protein entre and decrease portion of starch/bread  Mix protein powder, nut butter, almond/nut milk, non-fat dry milk, or Greek yogurt to shakes and smoothies  Use these ingredients also in baked goods or other recipes Use double the amount of sandwich filling  Add protein foods to all snacks including cheese, nut butters, milk and yogurt Food Tips for Including Protein  Beans Cook and use dried peas, beans, and tofu in soups or add to casseroles, pastas, and grain dishes that also contain cheese or meat  Mash with cheese and milk  Use tofu to make smoothies  Commercial Protein Supplements Use nutritional supplements or protein powder sold at pharmacies and grocery stores  Use protein powder in milk drinks and desserts, such as pudding  Mix with ice cream, milk, and fruit or other flavorings for a high-protein milkshake  Cottage Cheese or [employer] Mix with or use to stuff fruits and vegetables  Add to casseroles, spaghetti, noodles, or egg dishes such as omelets, scrambled eggs, and souffls  Use gelatin, pudding-type desserts, cheesecake, and pancake or waffle batter  Use to stuff crepes, pasta shells, or manicotti  Puree and use as a substitute for sour cream  Eggs, Egg whites, and Egg Yolks Add chopped, hard-cooked eggs to salads and dressings, vegetables, casseroles, and creamed meats  Beat eggs into mashed potatoes, vegetable purees, and sauces  Add extra  egg whites to quiches, scrambled eggs, custards, puddings, pancake batter, or [place] toast wash/batter  Make a rich custard with egg yolks, double strength milk, and sugar  Add extra hard-cooked yolks to deviled egg filling and sandwich spreads  Hard or Semi-Soft Cheese (Cheddar, Ree Kida, [place]) Melt on sandwiches, bread, muffins, tortillas, hamburgers, hot dogs, other meats or fish, vegetables, eggs, or desserts such as stewed figs or pies  Grate and add to soups, sauces, casseroles, vegetable dishes, potatoes, rice noodles, or meatloaf  Serve as a snack with crackers or bagels  Ice cream, Yogurt, and Frozen Yogurt Add to milk drinks such as milkshakes  Add to cereals, fruits, gelatin desserts, and pies  Blend or whip with soft or cooked fruits  Sandwich ice cream or frozen yogurt between enriched cake slices, cookies, or graham crackers  Use seasoned yogurt as a dip for fruits, vegetables, or chips  Use yogurt in place of sour cream in casseroles  Meat and Fish Add chopped, cooked meat or fish to vegetables, salads, casseroles, soups, sauces, and biscuit dough  Use in omelets, souffls, quiches, and sandwich fillings  Add chicken and [place] to stuffing  Wrap in pie crust or biscuit dough as turnovers  Add to stuffed baked potatoes  Add pureed meat to soups  Milk Use in beverages and in cooking  Use in preparing foods, such as hot cereal, soups, cocoa, or pudding  Add cream sauces to vegetable and other dishes  Use evaporated milk, evaporated skim milk, or sweetened condensed milk instead  of milk or water in recipes.  Nonfat Dry Milk Add 1/3 cup of nonfat dry milk powdered milk to each cup of regular milk for "double strength" milk  Add to yogurt and milk drinks, such as pasteurized eggnog and milkshakes  Add to scrambled eggs and mashed potatoes  Use in casseroles, meatloaf, hot cereal, breads, muffins, sauces, cream soups, puddings and custards, and other milk-based desserts  Nuts, Seeds,  and Wheat Germ Add to casseroles, breads, muffins, pancakes, cookies, and waffles  Sprinkle on fruit, cereal, ice cream, yogurt, vegetables, salads, and toast as a crunchy topping  Use in place of breadcrumbs  Blend with parsley or spinach, herbs, and cream for a noodle, pasta, or vegetable sauce.  Roll banana in chopped nuts  Peanut Butter Spread on sandwiches, toast, muffins, crackers, waffles, pancakes, and fruit slices  Use as a dip for raw vegetables, such as carrots, cauliflower, and celery  Blend with milk drinks, smoothies, and other beverages  Swirl through soft ice cream or yogurt  Spread on a banana then roll in crushed, dry cereal or chopped nuts   Copyright 2020  Academy of [employer]. All rights reserved.  Plate Method for Diabetes   Foods with carbohydrates make your blood glucose level go up. The plate method is a simple way to meal plan and control the amount of carbohydrate you eat.         Use the following guidance to build a healthy plate to control carbohydrates. Divide a 9-inch plate into 3 sections, and consider your beverage the 4th section of your meal: Food Group Examples of Foods/Beverages for This Section of your Meal  Section 1: Non-starchy vegetables Fill  of your plate to include non-starchy vegetables Asparagus, broccoli, brussels sprouts, cabbage, carrots, cauliflower, celery, cucumber, green beans, mushrooms, peppers, salad greens, tomatoes, or zucchini.  Section 2: Protein foods Fill  of your plate to include a lean protein Lean meat, poultry, fish, seafood, cheese, eggs, lean deli meat, tofu, beans, lentils, nuts or nut butters.  Section 3: Carbohydrate foods Fill  of your plate to include carbohydrate foods Whole grains, whole wheat bread, brown rice, whole grain pasta, polenta, corn tortillas, fruit, or starchy vegetables (potatoes, green peas, corn, beans, acorn squash, and butternut squash). One cup of milk also counts as a food that  contains carbohydrate.  Section 4: Beverage Choose water or a low-calorie drink for your beverage. Unsweetened tea, coffee, or flavored/sparkling water without added sugar.  Image reprinted with permission from The American Diabetes Association.  Copyright 2022 by the American Diabetes Association.   Copyright 2022  Academy of [employer] and Dietetics. All rights reserved    Carbohydrate Counting For People With Diabetes  Foods with carbohydrates make your blood glucose level go up. Learning how to count carbohydrates can help you control your blood glucose levels. First, identify the foods you eat that contain carbohydrates. Then, using the Foods with Carbohydrates chart, determine about how much carbohydrates are in your meals and snacks. Make sure you are eating foods with fiber, protein, and healthy fat along with your carbohydrate foods. Foods with Carbohydrates The following table shows carbohydrate foods that have about 15 grams of carbohydrate each. Using measuring cups, spoons, or a food scale when you first begin learning about carbohydrate counting can help you learn about the portion sizes you typically eat. The following foods have 15 grams carbohydrate each:  Grains 1 slice bread (1 ounce)  1 small tortilla (6-inch size)   large  bagel (1 ounce)  1/3 cup pasta or rice (cooked)   hamburger or hot dog bun ( ounce)   cup cooked cereal   to  cup ready-to-eat cereal  2 taco shells (5-inch size) Fruit 1 small fresh fruit ( to 1 cup)   medium banana  17 small grapes (3 ounces)  1 cup melon or berries   cup canned or frozen fruit  2 tablespoons dried fruit (blueberries, cherries, cranberries, raisins)   cup unsweetened fruit juice  Starchy Vegetables  cup cooked beans, peas, corn, potatoes/sweet potatoes   large baked potato (3 ounces)  1 cup acorn or butternut squash  Snack Foods 3 to 6 crackers  8 potato chips or 13 tortilla chips ( ounce to 1 ounce)  3 cups  popped popcorn  Dairy 3/4 cup (6 ounces) nonfat plain yogurt, or yogurt with sugar-free sweetener  1 cup milk  1 cup plain rice, soy, coconut or flavored almond milk Sweets and Desserts  cup ice cream or frozen yogurt  1 tablespoon jam, jelly, pancake syrup, table sugar, or honey  2 tablespoons light pancake syrup  1 inch square of frosted cake or 2 inch square of unfrosted cake  2 small cookies (2/3 ounce each) or  large cookie  Sometimes you'll have to estimate carbohydrate amounts if you don't know the exact recipe. One cup of mixed foods like soups can have 1 to 2 carbohydrate servings, while some casseroles might have 2 or more servings of carbohydrate. Foods that have less than 20 calories in each serving can be counted as "free" foods. Count 1 cup raw vegetables, or  cup cooked non-starchy vegetables as "free" foods. If you eat 3 or more servings at one meal, then count them as 1 carbohydrate serving.  Foods without Carbohydrates  Not all foods contain carbohydrates. Meat, some dairy, fats, non-starchy vegetables, and many beverages don't contain carbohydrate. So when you count carbohydrates, you can generally exclude chicken, pork, beef, fish, seafood, eggs, tofu, cheese, butter, sour cream, avocado, nuts, seeds, olives, mayonnaise, water, black coffee, unsweetened tea, and zero-calorie drinks. Vegetables with no or low carbohydrate include green beans, cauliflower, tomatoes, and onions. How much carbohydrate should I eat at each meal?  Carbohydrate counting can help you plan your meals and manage your weight. Following are some starting points for carbohydrate intake at each meal. Work with your registered dietitian nutritionist to find the best range that works for your blood glucose and weight.   To Lose Weight To Maintain Weight  Women 2 - 3 carb servings 3 - 4 carb servings  Men 3 - 4 carb servings 4 - 5 carb servings  Checking your blood glucose after meals will help you know if  you need to adjust the timing, type, or number of carbohydrate servings in your meal plan. Achieve and keep a healthy body weight by balancing your food intake and physical activity.  Tips How should I plan my meals?  Plan for half the food on your plate to include non-starchy vegetables, like salad greens, broccoli, or carrots. Try to eat 3 to 5 servings of non-starchy vegetables every day. Have a protein food at each meal. Protein foods include chicken, fish, meat, eggs, or beans (note that beans contain carbohydrate). These two food groups (non-starchy vegetables and proteins) are low in carbohydrate. If you fill up your plate with these foods, you will eat less carbohydrate but still fill up your stomach. Try to limit your carbohydrate portion to  of  the plate.  What fats are healthiest to eat?  Diabetes increases risk for heart disease. To help protect your heart, eat more healthy fats, such as olive oil, nuts, and avocado. Eat less saturated fats like butter, cream, and high-fat meats, like bacon and sausage. Avoid trans fats, which are in all foods that list "partially hydrogenated oil" as an ingredient. What should I drink?  Choose drinks that are not sweetened with sugar. The healthiest choices are water, carbonated or seltzer waters, and tea and coffee without added sugars.  Sweet drinks will make your blood glucose go up very quickly. One serving of soda or energy drink is  cup. It is best to drink these beverages only if your blood glucose is low.  Artificially sweetened, or diet drinks, typically do not increase your blood glucose if they have zero calories in them. Read labels of beverages, as some diet drinks do have carbohydrate and will raise your blood glucose. Label Reading Tips Read Nutrition Facts labels to find out how many grams of carbohydrate are in a food you want to eat. Don't forget: sometimes serving sizes on the label aren't the same as how much food you are going to eat,  so you may need to calculate how much carbohydrate is in the food you are serving yourself.   Carbohydrate Counting for People with Diabetes Sample 1-Day Menu  Breakfast  cup yogurt, low fat, low sugar (1 carbohydrate serving)   cup cereal, ready-to-eat, unsweetened (1 carbohydrate serving)  1 cup strawberries (1 carbohydrate serving)   cup almonds ( carbohydrate serving)  Lunch 1, 5 ounce can chunk light tuna  2 ounces cheese, low fat cheddar  6 whole wheat crackers (1 carbohydrate serving)  1 small apple (1 carbohydrate servings)   cup carrots ( carbohydrate serving)   cup snap peas  1 cup 1% milk (1 carbohydrate serving)   Evening Meal Stir fry made with: 3 ounces chicken  1 cup brown rice (3 carbohydrate servings)   cup broccoli ( carbohydrate serving)   cup green beans   cup onions  1 tablespoon olive oil  2 tablespoons teriyaki sauce ( carbohydrate serving)  Evening Snack 1 extra small banana (1 carbohydrate serving)  1 tablespoon peanut butter   Carbohydrate Counting for People with Diabetes Vegan Sample 1-Day Menu  Breakfast 1 cup cooked oatmeal (2 carbohydrate servings)   cup blueberries (1 carbohydrate serving)  2 tablespoons flaxseeds  1 cup soymilk fortified with calcium and vitamin D  1 cup coffee  Lunch 2 slices whole wheat bread (2 carbohydrate servings)   cup baked tofu   cup lettuce  2 slices tomato  2 slices avocado   cup baby carrots ( carbohydrate serving)  1 orange (1 carbohydrate serving)  1 cup soymilk fortified with calcium and vitamin D   Evening Meal Burrito made with: 1 6-inch corn tortilla (1 carbohydrate serving)  1 cup refried vegetarian beans (2 carbohydrate servings)   cup chopped tomatoes   cup lettuce   cup salsa  1/3 cup brown rice (1 carbohydrate serving)  1 tablespoon olive oil for rice   cup zucchini   Evening Snack 6 small whole grain crackers (1 carbohydrate serving)  2 apricots ( carbohydrate serving)    cup unsalted peanuts ( carbohydrate serving)    Carbohydrate Counting for People with Diabetes Vegetarian (Lacto-Ovo) Sample 1-Day Menu  Breakfast 1 cup cooked oatmeal (2 carbohydrate servings)   cup blueberries (1 carbohydrate serving)  2 tablespoons flaxseeds  1  egg  1 cup 1% milk (1 carbohydrate serving)  1 cup coffee  Lunch 2 slices whole wheat bread (2 carbohydrate servings)  2 ounces low-fat cheese   cup lettuce  2 slices tomato  2 slices avocado   cup baby carrots ( carbohydrate serving)  1 orange (1 carbohydrate serving)  1 cup unsweetened tea  Evening Meal Burrito made with: 1 6-inch corn tortilla (1 carbohydrate serving)   cup refried vegetarian beans (1 carbohydrate serving)   cup tomatoes   cup lettuce   cup salsa  1/3 cup brown rice (1 carbohydrate serving)  1 tablespoon olive oil for rice   cup zucchini  1 cup 1% milk (1 carbohydrate serving)  Evening Snack 6 small whole grain crackers (1 carbohydrate serving)  2 apricots ( carbohydrate serving)   cup unsalted peanuts ( carbohydrate serving)    Copyright 2020  Academy of [employer] and Dietetics. All rights reserved.  Using Nutrition Labels: Carbohydrate  Serving Size  Look at the serving size. All the information on the label is based on this portion. Servings Per Container  The number of servings contained in the package. Guidelines for Carbohydrate  Look at the total grams of carbohydrate in the serving size.  1 carbohydrate choice = 15 grams of carbohydrate. Range of Carbohydrate Grams Per Choice  Carbohydrate Grams/Choice Carbohydrate Choices  6-10   11-20 1  21-25 1  26-35 2  36-40 2  41-50 3  51-55 3  56-65 4  66-70 4  71-80 5    Copyright 2020  Academy [employer]etics. All rights reserved.
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
## 2023-01-14 NOTE — Consult Note (Signed)
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  He says that he is known to have had diverticulosis of the colon as per his colonoscopy about 5 years back when he used to live in [place].  It also appears based on his history he might of had an episode of diverticulitis back then.  No subsequent issues since then.  All of a sudden 2 days back he had an episode of painless bright red blood per rectum when he used the restroom then things got better and he was doing fine then last night yet again he had a similar episode but this time the blood was darker in color mixed with stool following which she got lightheaded and passed out in the restroom.  He denies any abdominal pain denies any fever denies any abdominal discomfort at this point of time in fact presently he has no complaints.  His baseline hemoglobin is around 13.6 g.  This morning his hemoglobin is 6.7 g.  He feels lightheaded when he stands up.  He had a CT angiogram scan in the emergency room yesterday that showed no evidence of active GI bleed but showed a linear radiopaque object in the distal descending colon extending outside of the bowel wall no intra-abdominal free air or free fluid noted.  The patient is being followed by surgery.  Past Medical History:  Diagnosis Date  . Abnormal EKG 03/29/2021  . Acid reflux   . Asthma    As a child  . Chest pain 08/31/2020  . Diabetes mellitus without complication (HCC)   . Erectile  dysfunction 01/28/2021  . Gout   . High cholesterol   . Hypertension   . Hypertensive urgency 03/29/2021    Past Surgical History:  Procedure Laterality Date  . LEFT HEART CATH AND CORONARY ANGIOGRAPHY N/A 08/31/2020   Procedure: LEFT HEART CATH AND CORONARY ANGIOGRAPHY;  Surgeon: Tonny Bollman, MD;  Location: [facility];  Service: Cardiovascular;  Laterality: N/A;    Prior to Admission medications   Medication Sig Start Date End Date Taking? Authorizing Provider  albuterol (VENTOLIN HFA) 108 (90 Base) MCG/ACT inhaler Inhale 2 puffs into the lungs every 6 (six) hours as needed for wheezing or shortness of breath. 08/31/20  Yes Madelyn Flavors A, MD  allopurinol (ZYLOPRIM) 300 MG tablet Take 1 tablet (300 mg total) by mouth daily. 04/17/21  Yes Rice, Jamesetta Orleans, MD  amLODipine (NORVASC) 5 MG tablet Take 1 tablet (5 mg total) by mouth daily for 180 doses. 08/18/22 02/14/23 Yes Masters, Katie, DO  diphenhydrAMINE (BENADRYL) 25 MG tablet Take 25 mg by mouth every 6 (six) hours as needed.   Yes [provider]  hydrochlorothiazide (HYDRODIURIL) 25 MG tablet Take 1 tablet (25 mg total) by mouth daily. 08/18/22  Yes Masters, Katie, DO  metFORMIN (GLUCOPHAGE) 1000 MG tablet Take 1 tablet (1,000 mg total) by  mouth 2 (two) times daily with a meal. 08/18/22  Yes Masters, Katie, DO  oxyCODONE-acetaminophen (PERCOCET) 5-325 MG tablet Take 1 tablet by mouth every 6 (six) hours as needed for severe pain (pain score 7-10). 12/05/22 12/05/23 Yes Shaune Pollack, MD  aspirin EC 81 MG EC tablet Take 1 tablet (81 mg total) by mouth daily. Swallow whole. Patient not taking: Reported on 04/09/2021 03/31/21   Darlin Priestly, MD  atorvastatin (LIPITOR) 10 MG tablet Take 1 tablet (10 mg total) by mouth daily. Patient not taking: Reported on 03/30/2021 01/28/21 07/27/21  Gardenia Phlegm, MD  ofloxacin (FLOXIN) 0.3 % OTIC solution Place 10 drops into the right ear daily. Patient not taking: Reported on 01/14/2023  11/18/22   Valinda Hoar, NP  sildenafil (VIAGRA) 50 MG tablet TAKE 1 TABLET BY MOUTH ONCE DAILY AS NEEDED 1  HOUR  BEFORE  SEXUAL  ACTIVITY  FOR  ERECTILE  DYSFUNCTION 12/22/22   Masters, Florentina Addison, DO  traZODone (DESYREL) 50 MG tablet Take 1 tablet (50 mg total) by mouth at bedtime. 05/30/19 03/08/20  Merrilee Jansky, MD    Family History  Problem Relation Age of Onset  . Diabetes Mother   . Diabetes Father   . Diabetes Sister   . Diabetes Sister   . Diabetes Brother   . Parkinson's disease Brother      Social History   Tobacco Use  . Smoking status: Former    Types: Cigars  . Smokeless tobacco: Never  Vaping Use  . Vaping status: Never Used  Substance Use Topics  . Alcohol use: Yes    Alcohol/week: 18.0 standard drinks of alcohol    Types: 18 Cans of beer per week  . Drug use: Never    Allergies as of 01/13/2023 - Review Complete 01/13/2023  Allergen Reaction Noted  . Ace inhibitors Swelling 08/31/2020  . Cat hair extract Shortness Of Breath 02/22/2013  . Losartan Swelling 09/01/2020  . Shellfish allergy Anaphylaxis and Swelling 02/22/2013  . Lisinopril  03/30/2021    Review of Systems:    All systems reviewed and negative except where noted in HPI.   Physical Exam:  Vital signs in last 24 hours: Temp:  [97.7 F (36.5 C)-99.2 F (37.3 C)] (P) 98.6 F (37 C) (12/04 0918) Pulse Rate:  [93-118] (P) 96 (12/04 0918) Resp:  [14-22] (P) 16 (12/04 0918) BP: (95-143)/(66-82) (P) 121/69 (12/04 0918) SpO2:  [98 %-100 %] (P) 100 % (12/04 0918) Weight:  [77.1 kg] 77.1 kg (12/03 2013) Last BM Date : 01/13/23 General:   Pleasant, cooperative in NAD Head:  Normocephalic and atraumatic. Eyes:   No icterus.   Conjunctiva pink. PERRLA. Ears:  Normal auditory acuity. Neck:  Supple; no masses or thyroidomegaly Lungs: Respirations even and unlabored. Lungs clear to auscultation bilaterally.   No wheezes, crackles, or rhonchi.  Heart:  Regular rate and rhythm;  Without murmur,  clicks, rubs or gallops Abdomen:  Soft, nondistended, nontender. Normal bowel sounds. No appreciable masses or hepatomegaly.  No rebound or guarding.  Neurologic:  Alert and oriented x3;  grossly normal neurologically. Skin:  Intact without significant lesions or rashes. Cervical Nodes:  No significant cervical adenopathy. Psych:  Alert and cooperative. Normal affect.  LAB RESULTS: Recent Labs    01/13/23 2028 01/14/23 0305 01/14/23 0910  WBC 8.4 7.0 6.3  HGB 9.6* 7.0* 6.7*  HCT 28.6* 20.0* 19.6*  PLT 243 186 167   BMET Recent Labs    01/13/23 2028 01/14/23 0305  NA 128* 131*  K 2.9* 3.6  CL 92* 101  CO2 23 25  GLUCOSE 482* 296*  BUN 24* 19  CREATININE 1.53* 1.05  CALCIUM 8.8* 7.6*   LFT Recent Labs    01/13/23 2028  PROT 6.4*  ALBUMIN 3.5  AST 16  ALT 16  ALKPHOS 62  BILITOT 0.5   PT/INR Recent Labs    01/14/23 0305  LABPROT 14.5  INR 1.1    STUDIES: DG ABD ACUTE 2+V W 1V CHEST  Result Date: 01/14/2023 CLINICAL DATA:  Colonic diverticular abscess. Rectal bleeding. Chest pain. EXAM: DG ABDOMEN ACUTE WITH 1 VIEW CHEST COMPARISON:  CTA abdomen and pelvis, 01/13/2023. Chest radiograph, 01/13/2023. FINDINGS: Normal bowel gas pattern. Tubular, metal density foreign body projects over the rectum, which may be superimposed on this patient. Another projects in the medial right inguinal region. The linear foreign body noted along the distal descending colon on the CT from yesterday is not visualized radiographically. No free air or air-fluid levels. No evidence of renal or ureteral stones. Heart, mediastinum hila are unremarkable.  Clear lungs. Skeletal structures are unremarkable. IMPRESSION: 1. No acute findings. 2. No evidence of bowel obstruction or free air. 3. Linear foreign body noted along the distal descending colon on the CT from yesterday is not visualized radiographically. 4. Metallic foreign bodies projecting over the rectum and medial right inguinal region.  Electronically Signed   By: Amie Portland M.D.   On: 01/14/2023 07:55   CT ANGIO GI BLEED  Result Date: 01/13/2023 CLINICAL DATA:  Lower abdominal pain.  GI bleed. EXAM: CTA ABDOMEN AND PELVIS WITHOUT AND WITH CONTRAST TECHNIQUE: Multidetector CT imaging of the abdomen and pelvis was performed using the standard protocol during bolus administration of intravenous contrast. Multiplanar reconstructed images and MIPs were obtained and reviewed to evaluate the vascular anatomy. RADIATION DOSE REDUCTION: This exam was performed according to the departmental dose-optimization program which includes automated exposure control, adjustment of the mA and/or kV according to patient size and/or use of iterative reconstruction technique. CONTRAST:  OMNIPAQUE IOHEXOL 350 MG/ML SOLN COMPARISON:  None Available. FINDINGS: VASCULAR Aorta: Normal caliber aorta without aneurysm, dissection, vasculitis or significant stenosis. Celiac: Patent without evidence of aneurysm, dissection, vasculitis or significant stenosis. SMA: Patent without evidence of aneurysm, dissection, vasculitis or significant stenosis. Renals: Both renal arteries are patent without evidence of aneurysm, dissection, vasculitis, fibromuscular dysplasia or significant stenosis. IMA: Patent without evidence of aneurysm, dissection, vasculitis or significant stenosis. Inflow: Patent without evidence of aneurysm, dissection, vasculitis or significant stenosis. Proximal Outflow: Bilateral common femoral and visualized portions of the superficial and profunda femoral arteries are patent without evidence of aneurysm, dissection, vasculitis or significant stenosis. Veins: The IVC is unremarkable. The SMV, splenic vein, and main portal vein are patent. No portal venous gas. Review of the MIP images confirms the above findings. NON-VASCULAR Lower chest: The visualized lung bases are clear. No intra-abdominal free air or free fluid. Hepatobiliary: The liver is  unremarkable. No biliary ductal dilatation. The gallbladder is unremarkable Pancreas: Unremarkable. No pancreatic ductal dilatation or surrounding inflammatory changes. Spleen: Normal in size without focal abnormality. Adrenals/Urinary Tract: The adrenal glands are unremarkable. There is no hydronephrosis or nephrolithiasis on either side. Subcentimeter right renal interpolar hypodense lesions are too small to characterize. There is symmetric enhancement of the renal parenchyma bilaterally. The visualized ureters and urinary bladder appear unremarkable. Stomach/Bowel: Moderate size hiatal hernia. There is an approximately 2.5 cm linear radiopaque foreign object in the distal descending colon  extending outside of the confines of the colonic wall. This may represent an ingested toothpick or a bone. There is associated inflammatory changes. No evidence of active bleed. There is colonic diverticulosis without active inflammatory changes. There is no bowel obstruction. The appendix is normal. Lymphatic: No adenopathy. Reproductive: The prostate and seminal vesicles are grossly unremarkable no pelvic mass. Other: None Musculoskeletal: No acute osseous pathology. IMPRESSION: 1. No evidence of active GI bleed. 2. Linear radiopaque foreign object in the distal descending colon extending outside of the bowel wall. No intra-abdominal free air or free fluid. 3. Colonic diverticulosis. No bowel obstruction. Normal appendix. 4. Moderate size hiatal hernia. Electronically Signed   By: Elgie Collard M.D.   On: 01/13/2023 22:26   DG Chest Port 1 View  Result Date: 01/13/2023 CLINICAL DATA:  Chest tightness and rectal bleeding x2 days. EXAM: PORTABLE CHEST 1 VIEW COMPARISON:  June 02, 2021 FINDINGS: The heart size and mediastinal contours are within normal limits. Both lungs are clear. The visualized skeletal structures are unremarkable. IMPRESSION: No active disease. Electronically Signed   By: Aram Candela M.D.   On:  01/13/2023 21:11      Impression / Plan:   YASHAS DUN is a 62 y.o. y/o male with 3-day history of painless bright red blood per rectum presents the emergency room with a significant lower GI bleed likely diverticular in nature with a drop in hemoglobin from 13.6 g at baseline to 6.7 g this morning and he is symptomatic.  CT angiogram showed no active bleed but a radiopaque object was seen in the distal descending colon extending outside the bowel wall.  No abdominal air was seen.  Colonic diverticulosis was noted.  On examination and per history his abdomen feels soft and he has no abdominal pain.  From the GI point of view would not recommend a colonoscopy at this point of time due to the presence of the foreign body which would increase the risk of perforation.  He is being managed by surgery.  If he has further bleeding would recommend repeating CT angiogram and if positive would suggest embolization.  It is very likely that the GI bleed is from a diverticular source.  He may benefit from blood transfusion as he is symptomatic and his hemoglobin is less than 7 g.  Would recommend follow-up as an outpatient nonemergent for a colonoscopy when his surgical issues have been resolved   I will sign off.  Please call me if any further GI concerns or questions.  We would like to thank you for the opportunity to participate in the care of Patrick Hawkins.   Thank you for involving me in the care of this patient.      LOS: 1 day   Wyline Mood, MD  01/14/2023, 10:10 AM

## 2023-01-14 NOTE — Plan of Care (Signed)
  RD consulted for nutrition education regarding diabetes.   Lab Results  Component Value Date   HGBA1C 10.6 (H) 01/13/2023   PTA DM medications are 1000 mg metformin BID.   Labs reviewed: CBGS: 211-228 (inpatient orders for glycemic control are 0-5 units insulin aspart daily, 0-9 units insulin aspart TID with meals, and 5 units insulin glargine-yfgn daily).    Case discussed with RN, who reports pt just transferred rooms; now in room 147.   Spoke with pt at bedside, who reports feeling better since transferring to new room and receiving blood transfusion. Pt is tolerating clear liquid diet without difficulty. Pt shares that he has been under a lot of stress since the COVID-19 pandemic. 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 Pt reports that he still has a lot of work stress and notices blood sugar changes when stress levels are high. Per pt, he also was losing weight during stress and started eating more carbohydrates to help gain weight back. Pt shares that he has regained most of his weight back (170-175#). Typical intake for the day includes: breakfast of eggs, bacon, and potatoes and toast; Lunch: hamburger or taco bell; Dinner: meat, starch, and vegetable. Pt concerned that excessive carbohydrate consumption has lead to high blood sugars, but also reports he is inconsistent with medications at times.   RD spent most of the visit discussing stress management and impact on blood sugar, increasing protein in diet to preserve lean body mass, and overall DM self-management including medication compliance.   RD provided "Carbohydrate Counting for People with Diabetes", "Plate Method", and "Tips for Adding Protein" handouts from the Academy of [employer] and  Dietetics. Discussed different food groups and their effects on blood sugar, emphasizing carbohydrate-containing foods. Provided list of carbohydrates and recommended serving sizes of common foods.  Discussed importance of controlled and consistent carbohydrate intake throughout the day. Provided examples of ways to balance meals/snacks and encouraged intake of high-fiber, whole grain complex carbohydrates. Teach back method used.  Expect fair to good compliance.  Nutrition-Focused physical exam completed. Findings are no fat depletion, no muscle depletion, and no edema.    Body mass index is 24.39 kg/m. Pt meets criteria for none based on current BMI.  Current diet order is clear liquids, patient is consuming approximately 100% of meals at this time. Labs and medications reviewed. No further nutrition interventions warranted at this time. RD contact information provided. If additional nutrition issues arise, please re-consult RD.  Levada Schilling, RD, LDN, CDCES Registered Dietitian III Certified Diabetes Care and Education Specialist Please refer to [facility] for RD and/or RD on-call/weekend/after hours pager

## 2023-01-14 NOTE — Inpatient Diabetes Management (Signed)
Inpatient Diabetes Program Recommendations  AACE/ADA: New Consensus Statement on Inpatient Glycemic Control   Target Ranges:  Prepandial:   less than 140 mg/dL      Peak postprandial:   less than 180 mg/dL (1-2 hours)      Critically ill patients:  140 - 180 mg/dL    Latest Reference Range & Units 01/13/23 20:18 01/14/23 01:14  Glucose-Capillary 70 - 99 mg/dL 213 (H) 086 (H)    Latest Reference Range & Units 08/18/22 11:27 01/13/23 20:27  Hemoglobin A1C 4.8 - 5.6 % 8.3 ! 10.6 (H)    Latest Reference Range & Units 01/13/23 20:28  CO2 22 - 32 mmol/L 23  Glucose 70 - 99 mg/dL 578 (H)  Anion gap 5 - 15  13   Review of Glycemic Control  Diabetes history: DM2 Outpatient Diabetes medications: Metformin 1000 mg BID Current orders for Inpatient glycemic control: Semglee 5 units daily, Novolog 0-9 units TID with meals, Novolog 0-5 units QHS  Inpatient Diabetes Program Recommendations:    HbgA1C:  A1C 10.6% on 01/13/23 indicating an average glucose of 258 mg/dl over the past 2-3 months.  Outpatient DM: May want to consider ordering additional oral DM medication at time of discharge and have patient follow up with PCP.  NOTE: Patient admitted with rectal bleeding and initial glucose of 482 mg/dl on 46/9/62. Per chart review, patient last seen PCP on 08/18/22 and noted A1C 8.3% at that time. Per office note on 08/18/22, "He does not take metformin regularly because he does not like taking medications. He is prescribed metformin 1000 mg BID." Addendum 01/14/23@13 :00-Spoke with patient at bedside regarding DM control. Patient states that he is taking Metformin 1000 mg BID for DM control. Patient reports that he had not been taking the Metformin consistently until almost 2 months ago because his glucose trends were running higher; patient reports CBGs 180-220's mg/dl fasting over the past few weeks. Patient reports that he has been under a great deal of stress over the past several months and he also notes  that he has been losing weight unintentionally so he has been eating/drinking excessive carbohydrates trying to gain his weight back. Patient reports his last A1C was 8.3% in July 2024. Discussed A1C results (10.6% on 01/13/72) and explained that current A1C indicates an average glucose of 258 mg/dl over the past 2-3 months. Discussed glucose and A1C goals. Discussed importance of checking CBGs and maintaining good CBG control to prevent long-term and short-term complications. Explained how hyperglycemia leads to damage within blood vessels which lead to the common complications seen with uncontrolled diabetes. Stressed to the patient the importance of improving glycemic control to prevent further complications from uncontrolled diabetes. Discussed impact of nutrition, exercise, stress, sickness, and medications on diabetes control.  Informed patient RD consult would be ordered to address his weight loss and how he could regain weight without eating excessive carbs. Explained that if he has been consistently taking Metformin over the past 2 months and CBGs are still in 180-220's mg/dl, then he may need additional DM medication prescribed at discharge. Patient agreeable to take additional DM medication if needed. Encouraged patient to follow up with PCP regarding DM management. Patient verbalized understanding of information discussed and reports no further questions at this time related to diabetes.  Thanks,  Orlando Penner, RN, MSN, CDCES Diabetes Coordinator Inpatient Diabetes Program [phone] (Team Pager from 8am to 5pm)

## 2023-01-15 DIAGNOSIS — K625 Hemorrhage of anus and rectum: Secondary | ICD-10-CM | POA: Diagnosis not present

## 2023-01-15 DIAGNOSIS — T184XXA Foreign body in colon, initial encounter: Secondary | ICD-10-CM | POA: Diagnosis not present

## 2023-01-15 LAB — BPAM RBC
Blood Product Expiration Date: 202412312359
ISSUE DATE / TIME: 202412040845
Unit Type and Rh: 5100

## 2023-01-15 LAB — BASIC METABOLIC PANEL
Anion gap: 5 (ref 5–15)
BUN: 8 mg/dL (ref 8–23)
CO2: 26 mmol/L (ref 22–32)
Calcium: 7.5 mg/dL — ABNORMAL LOW (ref 8.9–10.3)
Chloride: 105 mmol/L (ref 98–111)
Creatinine, Ser: 0.9 mg/dL (ref 0.61–1.24)
GFR, Estimated: >60 mL/min (ref >60)
Glucose, Bld: 175 mg/dL — ABNORMAL HIGH (ref 70–99)
Potassium: 3.2 mmol/L — ABNORMAL LOW (ref 3.5–5.1)
Sodium: 136 mmol/L (ref 135–145)

## 2023-01-15 LAB — GLUCOSE, CAPILLARY
Glucose-Capillary: 170 mg/dL — ABNORMAL HIGH (ref 70–99)
Glucose-Capillary: 198 mg/dL — ABNORMAL HIGH (ref 70–99)
Glucose-Capillary: 263 mg/dL — ABNORMAL HIGH (ref 70–99)

## 2023-01-15 LAB — CBC
HCT: 22.4 % — ABNORMAL LOW (ref 39.0–52.0)
Hemoglobin: 7.9 g/dL — ABNORMAL LOW (ref 13.0–17.0)
MCH: 29.7 pg (ref 26.0–34.0)
MCHC: 35.3 g/dL (ref 30.0–36.0)
MCV: 84.2 fL (ref 80.0–100.0)
Platelets: 171 10*3/uL (ref 150–400)
RBC: 2.66 MIL/uL — ABNORMAL LOW (ref 4.22–5.81)
RDW: 12.2 % (ref 11.5–15.5)
WBC: 6.7 10*3/uL (ref 4.0–10.5)
nRBC: 0 % (ref 0.0–0.2)

## 2023-01-15 LAB — TYPE AND SCREEN
ABO/RH(D): O POS
Antibody Screen: NEGATIVE
Unit division: 0

## 2023-01-15 MED ORDER — AMOXICILLIN-POT CLAVULANATE 875-125 MG PO TABS
1.0000 | ORAL_TABLET | Freq: Two times a day (BID) | ORAL | Status: DC
  Administered 2023-01-15 – 2023-01-16 (×2): 1 via ORAL
  Filled 2023-01-15 (×2): qty 1

## 2023-01-15 NOTE — TOC Initial Note (Addendum)
Transition of Care [facility]) - Initial/Assessment Note    Patient Details  Name: Patrick Hawkins MRN: [id number] Date of Birth: 10-27-1960  Transition of Care [facility]) CM/SW Contact:    Marlowe Sax, RN Phone Number: 01/15/2023, 10:55 AM  Clinical Narrative:                 Patient PCP Masters, Florentina Addison, DO From Home with SO No identified TOC needs  Transition of Care (TOC) Screening Note   Patient Details  Name: Patrick Hawkins Date of Birth: 09-Mar-1960   Transition of Care [facility]) CM/SW Contact:    Marlowe Sax, RN Phone Number: 01/15/2023, 10:56 AM    Transition of Care Department [facility]) has reviewed patient and no TOC needs have been identified at this time. We will continue to monitor patient advancement through interdisciplinary progression rounds. If new patient transition needs arise, please place a TOC consult.   NO SDOH resources needed based on Assessment on 01/14/23  Expected Discharge Plan: Home/Self Care Barriers to Discharge: Continued Medical Work up   Patient Goals and CMS Choice            Expected Discharge Plan and Services   Discharge Planning Services: CM Consult   Living arrangements for the past 2 months: Single Family Home                 DME Arranged: N/A DME Agency: NA       HH Arranged: NA          Prior Living Arrangements/Services Living arrangements for the past 2 months: Single Family Home Lives with:: Significant Other Patient language and need for interpreter reviewed:: Yes Do you feel safe going back to the place where you live?: Yes      Need for Family Participation in Patient Care: No (Comment)     Criminal Activity/Legal Involvement Pertinent to Current Situation/Hospitalization: No - Comment as needed  Activities of Daily Living   ADL Screening (condition at time of admission) Independently performs ADLs?: Yes (appropriate for developmental age) Is the patient deaf or have difficulty hearing?: No Does  the patient have difficulty seeing, even when wearing glasses/contacts?: No Does the patient have difficulty concentrating, remembering, or making decisions?: No  Permission Sought/Granted   Permission granted to share information with : Yes, Verbal Permission Granted              Emotional Assessment Appearance:: Appears stated age Attitude/Demeanor/Rapport: Engaged Affect (typically observed): Accepting Orientation: : Oriented to Self, Oriented to Place, Oriented to  Time Alcohol / Substance Use: Alcohol Use Psych Involvement: No (comment)  Admission diagnosis:  Acute blood loss anemia [D62] Rectal bleeding [K62.5] Foreign body in descending colon [T18.4XXA] Patient Active Problem List   Diagnosis Date Noted   Diabetes mellitus without complication (HCC) 01/14/2023   Hyponatremia 01/14/2023   Hypokalemia 01/14/2023   Myocardial injury 01/14/2023   Foreign body in colon 01/14/2023   Acute blood loss anemia 01/14/2023   Foreign body in descending colon 01/14/2023   Rectal bleeding 01/13/2023   Alcohol use 08/19/2022   Adjustment disorder 08/19/2022   Unintentional weight loss 08/19/2022   Positive ANA (antinuclear antibody) 04/09/2021   Pain in right knee 04/09/2021   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   Chronic night sweats 03/01/2021   Type 2 diabetes mellitus (HCC) 01/28/2021   Hypertension 01/28/2021   Hyperlipidemia 01/28/2021   AKI (acute kidney injury) (HCC) 08/31/2020   Gout 09/26/2013   PCP:  Masters, Psychiatric nurse, DO Pharmacy:   [id number] - [place], [place] - [address] [place] [address] Phone: [phone] Fax: [phone]  [facility] [address] [place] - [address] [place] [address] Phone: [phone] Fax: [phone]  [id number] - [place] [facility] - [address] [place] [address] Phone: [phone] Fax:  [phone]     Social Determinants of Health (SDOH) Social History: SDOH Screenings   Food Insecurity: No Food Insecurity (01/14/2023)  Housing: Patient Declined (01/14/2023)  Transportation Needs: No Transportation Needs (01/14/2023)  Utilities: Not At Risk (01/14/2023)  Depression (PHQ2-9): Medium Risk (08/18/2022)  Tobacco Use: Medium Risk (01/13/2023)   SDOH Interventions:     Readmission Risk Interventions     No data to display

## 2023-01-15 NOTE — Progress Notes (Signed)
Pt had a bowel movement at 0600 this morning. The stool was mostly liquid/watery yellow in color with what appeared to small round hard stool black in color. No blood in stool. Will continue to monitor. Pt completed bowel prep.
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
## 2023-01-15 NOTE — Progress Notes (Addendum)
[facility] SUR[facility]ICAL PROGRESS NOTE (cpt [phone])  Hospital Day(s): 2.   Interval History: Patient seen and examined, no acute events or new complaints overnight. Patient reports he is feeling better this morning. Reports his strength has returned after transfusion. He denies fever, chills, nausea, emesis, or abdominal pain. He is without leukocytosis; WBC 6.7K. Hgb to 7.9 after 1 unit pRBC yesterday (12/04). Renal function normal; sCr - 0.90; OU - unmeasured. Mild hypokalemia to 3.2. He did undergo bowel prep yesterday. Tolerated well. Reported stools are watery and yellow in color. No bright red blood.   Review of Systems:  Constitutional: denies fever, chills  HEENT: denies cough or congestion  Respiratory: denies any shortness of breath  Cardiovascular: denies chest pain or palpitations  Gastrointestinal: denies abdominal pain, N/V, denied bloody stools in last 12-24 hours Genitourinary: denies burning with urination or urinary frequency Musculoskeletal: denies pain, decreased motor or sensation  Vital signs in last 24 hours: [min-max] current  Temp:  [98 F (36.7 C)-99.2 F (37.3 C)] 98.4 F (36.9 C) (12/05 0546) Pulse Rate:  [75-100] 77 (12/05 0546) Resp:  [16-20] 18 (12/05 0546) BP: (111-154)/(68-88) 122/84 (12/05 0546) SpO2:  [98 %-100 %] 100 % (12/05 0546)     Height: 5\' 10"  (177.8 cm) Weight: 77.1 kg BMI (Calculated): 24.39   Intake/Output last 2 shifts:  12/04 0701 - 12/05 0700 In: 1177.7 [P.O.:300; I.V.:409.7; Blood:418; IV Piggyback:50] Out: -    Physical Exam:  Constitutional: alert, cooperative and no distress  HENT: normocephalic without obvious abnormality  Eyes: PERRL, EOM's grossly intact and symmetric  Respiratory: breathing non-labored at rest  Cardiovascular: regular rate and sinus rhythm  Gastrointestinal: soft, non-tender, and non-distended. No rebound/guarding Musculoskeletal: no edema or wounds, motor and sensation grossly intact, NT     Labs:     Latest Ref Rng & Units 01/15/2023    4:52 AM 01/14/2023    4:06 PM 01/14/2023    9:10 AM  CBC  WBC 4.0 - 10.5 K/uL 6.7   6.3   Hemoglobin 13.0 - 17.0 g/dL 7.9  8.2  6.7   Hematocrit 39.0 - 52.0 % 22.4   19.6   Platelets 150 - 400 K/uL 171   167       Latest Ref Rng & Units 01/15/2023    4:52 AM 01/14/2023    3:05 AM 01/13/2023    8:28 PM  CMP  Glucose 70 - 99 mg/dL 956  213  086   BUN 8 - 23 mg/dL 8  19  24    Creatinine 0.61 - 1.24 mg/dL 5.78  4.69  6.29   Sodium 135 - 145 mmol/L 136  131  128   Potassium 3.5 - 5.1 mmol/L 3.2  3.6  2.9   Chloride 98 - 111 mmol/L 105  101  92   CO2 22 - 32 mmol/L 26  25  23    Calcium 8.9 - 10.3 mg/dL 7.5  7.6  8.8   Total Protein 6.5 - 8.1 g/dL   6.4   Total Bilirubin <1.2 mg/dL   0.5   Alkaline Phos 38 - 126 U/L   62   AST 15 - 41 U/L   16   ALT 0 - 44 U/L   16     Imaging studies: No new pertinent imaging studies   Assessment/Plan:  62 y.o. male with now resolved GI bleeding, likely diverticular source, also with descending colon foreign body thought to be incidental finding   - He was  able to tolerate bowel prep without issue and has not had any further evidence of bleeding. He has responded to transfusion appropriately. For now, we will continue to monitor for bleeding. If bleeding recurs would recommend tagged red blood cell scan to try and localize the bleed prior to considering any surgical resection. Again, foreign body thought to be potentially an incidental finding and there is no clinical evidence of perforation. GI on board as well and appreciate input; holding on endoscopic evaluation given foreign body and associated inflammation which increases risk of perforation    - Will gradually advance diet  - Monitor H&H; transfuse as needed   - Monitor abdominal examination; on-going bowel function - Okay to mobilize - Further management per primary service; we iwll follow  - Discharge Planning: Doing well, no bleeding  with bowel prep. Likely need to observe at least 24-48 hours more to ensure no bleeding prior to considering DC. Will benefit from outpatient colonoscopy once recovered and felt safe from GI perspective.    All of the above findings and recommendations were discussed with the patient, and the medical team, and all of patient's questions were answered to his expressed satisfaction.  -- Patrick Oxford, PA-C [facility] 01/15/2023, 7:30 AM M-F: 7am - 4pm

## 2023-01-15 NOTE — Plan of Care (Signed)

## 2023-01-16 DIAGNOSIS — K625 Hemorrhage of anus and rectum: Secondary | ICD-10-CM | POA: Diagnosis not present

## 2023-01-16 DIAGNOSIS — T184XXA Foreign body in colon, initial encounter: Secondary | ICD-10-CM | POA: Diagnosis not present

## 2023-01-16 LAB — GLUCOSE, CAPILLARY
Glucose-Capillary: 258 mg/dL — ABNORMAL HIGH (ref 70–99)
Glucose-Capillary: 238 mg/dL — ABNORMAL HIGH (ref 70–99)

## 2023-01-16 LAB — CBC
HCT: 22.3 % — ABNORMAL LOW (ref 39.0–52.0)
Hemoglobin: 7.6 g/dL — ABNORMAL LOW (ref 13.0–17.0)
MCH: 29.3 pg (ref 26.0–34.0)
MCHC: 34.1 g/dL (ref 30.0–36.0)
MCV: 86.1 fL (ref 80.0–100.0)
Platelets: 198 10*3/uL (ref 150–400)
RBC: 2.59 MIL/uL — ABNORMAL LOW (ref 4.22–5.81)
RDW: 12.4 % (ref 11.5–15.5)
WBC: 6.1 10*3/uL (ref 4.0–10.5)
nRBC: 0 % (ref 0.0–0.2)

## 2023-01-16 LAB — BASIC METABOLIC PANEL
Anion gap: 5 (ref 5–15)
BUN: 13 mg/dL (ref 8–23)
CO2: 26 mmol/L (ref 22–32)
Calcium: 8.1 mg/dL — ABNORMAL LOW (ref 8.9–10.3)
Chloride: 105 mmol/L (ref 98–111)
Creatinine, Ser: 1.12 mg/dL (ref 0.61–1.24)
GFR, Estimated: >60 mL/min (ref >60)
Glucose, Bld: 304 mg/dL — ABNORMAL HIGH (ref 70–99)
Potassium: 4.3 mmol/L (ref 3.5–5.1)
Sodium: 136 mmol/L (ref 135–145)

## 2023-01-16 MED ORDER — FERROUS SULFATE 325 (65 FE) MG PO TBEC: 325.0000 mg | DELAYED_RELEASE_TABLET | ORAL | 1 refills | Status: DC

## 2023-01-16 MED ORDER — AMOXICILLIN-POT CLAVULANATE 875-125 MG PO TABS: 1.0000 | ORAL_TABLET | Freq: Two times a day (BID) | ORAL | 0 refills | Status: DC

## 2023-01-16 NOTE — Discharge Summary (Signed)
GERMAN CHAKRABORTY JXB:[id number] DOB: [birth year] DOA: 01/13/2023  PCP: Rudene Christians, DO  Admit date: 01/13/2023 Discharge date: 01/16/2023  Time spent: 35 minutes  Recommendations for Outpatient Follow-up:  Pcp f/u GI f/u Gen surg f/u     Discharge Diagnoses:  Principal Problem:   Rectal bleeding Active Problems:   Acute blood loss anemia   Foreign body in colon   Myocardial injury   AKI (acute kidney injury) (HCC)   Hypertension   Hyperlipidemia   Diabetes mellitus without complication (HCC)   Hyponatremia   Hypokalemia   Alcohol use   Foreign body in descending colon   Discharge Condition: stable  Diet recommendation: carb modified  Filed Weights   01/13/23 2013  Weight: 77.1 kg    History of present illness:  From admission h andp AURON FORRISTER is a 62 y.o. male with medical history significant of hypertension, hyperlipidemia, diabetes mellitus, gout, GERD, and call abuse, who presents with rectal bleeding.   Patient states that he has intermittent rectal bleeding in the past 2 days, approximately 5 times of rectal bleeding.  Initially he had bright red blood in stool, then he noticed dark stool.  Patient denies nausea, vomiting, abdominal pain.  No fever or chills.  Patient reports chest tightness, and mild shortness of breath on exertion, no cough.  Denies symptoms of UTI.  He states that he is not taking aspirin, but takes ibuprofen for pain sometimes.    Hospital Course:  Patient presents with painless hematochezia. CTA doesn't show source of bleeding. Had similar episode 5 years chalked up to diverticular bleed. GI and gen surg consulted here. This thought to be diverticular bleed as well. Did require 1 unit prbc with stable hgb after that. Bleeding resolved without treatment. CTA shows possible foreign body in colon with surrounding inflammation, no signs perforation. This is possible artifact but gen surg and gi advise treating with augmentin for 2 weeks to  treat like diverticulitis, plan will be outpatient f/u with gen surg and GI. Those two will determine whether and how to pursue further evaluation of this possible foreign body (repeat scan, colonoscopy, etc.). Diabetes is uncontrolled, will need close pcp f/u for that.  Procedures: none   Consultations: GI, gen surg  Discharge Exam: Vitals:   01/16/23 0440 01/16/23 0802  BP: 131/73 (!) 143/84  Pulse: 98 96  Resp: 18 16  Temp: 98.2 F (36.8 C) 98 F (36.7 C)  SpO2: 100% 100%    General: NAD Cardiovascular: RRR Respiratory: CTAB Abdomen: soft, non-tender  Discharge Instructions   Discharge Instructions     Diet - low sodium heart healthy   Complete by: As directed    Increase activity slowly   Complete by: As directed       Allergies as of 01/16/2023       Reactions   Ace Inhibitors Swelling   Angioedema 08/31/2020   Cat Hair Extract Shortness Of Breath   Losartan Swelling   Shellfish Allergy Anaphylaxis, Swelling   Other reaction(s): Angioedema   Lisinopril         Medication List     STOP taking these medications    aspirin EC 81 MG tablet       TAKE these medications    albuterol 108 (90 Base) MCG/ACT inhaler Commonly known as: VENTOLIN HFA Inhale 2 puffs into the lungs every 6 (six) hours as needed for wheezing or shortness of breath.   allopurinol 300 MG tablet Commonly known as: ZYLOPRIM Take 1  tablet (300 mg total) by mouth daily.   amLODipine 5 MG tablet Commonly known as: NORVASC Take 1 tablet (5 mg total) by mouth daily for 180 doses.   amoxicillin-clavulanate 875-125 MG tablet Commonly known as: AUGMENTIN Take 1 tablet by mouth every 12 (twelve) hours.   atorvastatin 10 MG tablet Commonly known as: LIPITOR Take 1 tablet (10 mg total) by mouth daily.   diphenhydrAMINE 25 MG tablet Commonly known as: BENADRYL Take 25 mg by mouth every 6 (six) hours as needed.   ferrous sulfate 325 (65 FE) MG EC tablet Take 1 tablet (325 mg  total) by mouth every other day.   hydrochlorothiazide 25 MG tablet Commonly known as: HYDRODIURIL Take 1 tablet (25 mg total) by mouth daily.   metFORMIN 1000 MG tablet Commonly known as: GLUCOPHAGE Take 1 tablet (1,000 mg total) by mouth 2 (two) times daily with a meal.   ofloxacin 0.3 % OTIC solution Commonly known as: FLOXIN Place 10 drops into the right ear daily.   oxyCODONE-acetaminophen 5-325 MG tablet Commonly known as: Percocet Take 1 tablet by mouth every 6 (six) hours as needed for severe pain (pain score 7-10).   sildenafil 50 MG tablet Commonly known as: VIAGRA TAKE 1 TABLET BY MOUTH ONCE DAILY AS NEEDED 1  HOUR  BEFORE  SEXUAL  ACTIVITY  FOR  ERECTILE  DYSFUNCTION       Allergies  Allergen Reactions   Ace Inhibitors Swelling    Angioedema 08/31/2020   Cat Hair Extract Shortness Of Breath   Losartan Swelling   Shellfish Allergy Anaphylaxis and Swelling    Other reaction(s): Angioedema   Lisinopril     Follow-up Information     Leafy Ro, MD. Go on 02/02/2023.   Specialty: General Surgery Why: Go to appointment on 12/23 at 1015 AM Contact information: [address] [place] [address] [phone]         Masters, Florentina Addison, DO Follow up.   Specialty: Internal Medicine Contact information: [address] [place] [address] [phone]         Wyline Mood, MD Follow up.   Specialty: Gastroenterology Why: in about 6 weeks Contact information: [address] [place] [address] [phone]                  The results of significant diagnostics from this hospitalization (including imaging, microbiology, ancillary and laboratory) are listed below for reference.    Significant Diagnostic Studies: DG ABD ACUTE 2+V W 1V CHEST  Result Date: 01/14/2023 CLINICAL DATA:  Colonic diverticular abscess. Rectal bleeding. Chest pain. EXAM: DG ABDOMEN ACUTE WITH 1 VIEW CHEST COMPARISON:  CTA  abdomen and pelvis, 01/13/2023. Chest radiograph, 01/13/2023. FINDINGS: Normal bowel gas pattern. Tubular, metal density foreign body projects over the rectum, which may be superimposed on this patient. Another projects in the medial right inguinal region. The linear foreign body noted along the distal descending colon on the CT from yesterday is not visualized radiographically. No free air or air-fluid levels. No evidence of renal or ureteral stones. Heart, mediastinum hila are unremarkable.  Clear lungs. Skeletal structures are unremarkable. IMPRESSION: 1. No acute findings. 2. No evidence of bowel obstruction or free air. 3. Linear foreign body noted along the distal descending colon on the CT from yesterday is not visualized radiographically. 4. Metallic foreign bodies projecting over the rectum and medial right inguinal region. Electronically Signed   By: Amie Portland M.D.   On: 01/14/2023  07:55   CT ANGIO GI BLEED  Result Date: 01/13/2023 CLINICAL DATA:  Lower abdominal pain.  GI bleed. EXAM: CTA ABDOMEN AND PELVIS WITHOUT AND WITH CONTRAST TECHNIQUE: Multidetector CT imaging of the abdomen and pelvis was performed using the standard protocol during bolus administration of intravenous contrast. Multiplanar reconstructed images and MIPs were obtained and reviewed to evaluate the vascular anatomy. RADIATION DOSE REDUCTION: This exam was performed according to the departmental dose-optimization program which includes automated exposure control, adjustment of the mA and/or kV according to patient size and/or use of iterative reconstruction technique. CONTRAST:  OMNIPAQUE IOHEXOL 350 MG/ML SOLN COMPARISON:  None Available. FINDINGS: VASCULAR Aorta: Normal caliber aorta without aneurysm, dissection, vasculitis or significant stenosis. Celiac: Patent without evidence of aneurysm, dissection, vasculitis or significant stenosis. SMA: Patent without evidence of aneurysm, dissection, vasculitis or significant  stenosis. Renals: Both renal arteries are patent without evidence of aneurysm, dissection, vasculitis, fibromuscular dysplasia or significant stenosis. IMA: Patent without evidence of aneurysm, dissection, vasculitis or significant stenosis. Inflow: Patent without evidence of aneurysm, dissection, vasculitis or significant stenosis. Proximal Outflow: Bilateral common femoral and visualized portions of the superficial and profunda femoral arteries are patent without evidence of aneurysm, dissection, vasculitis or significant stenosis. Veins: The IVC is unremarkable. The SMV, splenic vein, and main portal vein are patent. No portal venous gas. Review of the MIP images confirms the above findings. NON-VASCULAR Lower chest: The visualized lung bases are clear. No intra-abdominal free air or free fluid. Hepatobiliary: The liver is unremarkable. No biliary ductal dilatation. The gallbladder is unremarkable Pancreas: Unremarkable. No pancreatic ductal dilatation or surrounding inflammatory changes. Spleen: Normal in size without focal abnormality. Adrenals/Urinary Tract: The adrenal glands are unremarkable. There is no hydronephrosis or nephrolithiasis on either side. Subcentimeter right renal interpolar hypodense lesions are too small to characterize. There is symmetric enhancement of the renal parenchyma bilaterally. The visualized ureters and urinary bladder appear unremarkable. Stomach/Bowel: Moderate size hiatal hernia. There is an approximately 2.5 cm linear radiopaque foreign object in the distal descending colon extending outside of the confines of the colonic wall. This may represent an ingested toothpick or a bone. There is associated inflammatory changes. No evidence of active bleed. There is colonic diverticulosis without active inflammatory changes. There is no bowel obstruction. The appendix is normal. Lymphatic: No adenopathy. Reproductive: The prostate and seminal vesicles are grossly unremarkable no pelvic  mass. Other: None Musculoskeletal: No acute osseous pathology. IMPRESSION: 1. No evidence of active GI bleed. 2. Linear radiopaque foreign object in the distal descending colon extending outside of the bowel wall. No intra-abdominal free air or free fluid. 3. Colonic diverticulosis. No bowel obstruction. Normal appendix. 4. Moderate size hiatal hernia. Electronically Signed   By: Elgie Collard M.D.   On: 01/13/2023 22:26   DG Chest Port 1 View  Result Date: 01/13/2023 CLINICAL DATA:  Chest tightness and rectal bleeding x2 days. EXAM: PORTABLE CHEST 1 VIEW COMPARISON:  June 02, 2021 FINDINGS: The heart size and mediastinal contours are within normal limits. Both lungs are clear. The visualized skeletal structures are unremarkable. IMPRESSION: No active disease. Electronically Signed   By: Aram Candela M.D.   On: 01/13/2023 21:11    Microbiology: No results found for this or any previous visit (from the past 240 hour(s)).   Labs: Basic Metabolic Panel: Recent Labs  Lab 01/13/23 2028 01/13/23 2229 01/14/23 0305 01/15/23 0452 01/16/23 0503  NA 128*  --  131* 136 136  K 2.9*  --  3.6 3.2*  4.3  CL 92*  --  101 105 105  CO2 23  --  25 26 26   GLUCOSE 482*  --  296* 175* 304*  BUN 24*  --  19 8 13   CREATININE 1.53*  --  1.05 0.90 1.12  CALCIUM 8.8*  --  7.6* 7.5* 8.1*  MG  --  1.7  --   --   --   PHOS  --  3.3  --   --   --    Liver Function Tests: Recent Labs  Lab 01/13/23 2028  AST 16  ALT 16  ALKPHOS 62  BILITOT 0.5  PROT 6.4*  ALBUMIN 3.5   No results for input(s): "LIPASE", "AMYLASE" in the last 168 hours. No results for input(s): "AMMONIA" in the last 168 hours. CBC: Recent Labs  Lab 01/13/23 2028 01/14/23 0305 01/14/23 0910 01/14/23 1606 01/15/23 0452 01/16/23 0503  WBC 8.4 7.0 6.3  --  6.7 6.1  HGB 9.6* 7.0* 6.7* 8.2* 7.9* 7.6*  HCT 28.6* 20.0* 19.6*  --  22.4* 22.3*  MCV 84.9 82.3 83.4  --  84.2 86.1  PLT 243 186 167  --  171 198   Cardiac  Enzymes: No results for input(s): "CKTOTAL", "CKMB", "CKMBINDEX", "TROPONINI" in the last 168 hours. BNP: BNP (last 3 results) No results for input(s): "BNP" in the last 8760 hours.  ProBNP (last 3 results) No results for input(s): "PROBNP" in the last 8760 hours.  CBG: Recent Labs  Lab 01/15/23 0748 01/15/23 1130 01/15/23 1613 01/15/23 2133 01/16/23 0818  GLUCAP 170* 198* 263* 258* 238*       Signed:  Silvano Bilis MD.  [facility] 01/16/2023, 9:34 AM 3

## 2023-01-16 NOTE — Inpatient Diabetes Management (Signed)
Inpatient Diabetes Program Recommendations  AACE/ADA: New Consensus Statement on Inpatient Glycemic Control   Target Ranges:  Prepandial:   less than 140 mg/dL      Peak postprandial:   less than 180 mg/dL (1-2 hours)      Critically ill patients:  140 - 180 mg/dL    Latest Reference Range & Units 01/15/23 07:48 01/15/23 11:30 01/15/23 16:13 01/15/23 21:33 01/16/23 08:18  Glucose-Capillary 70 - 99 mg/dL 301 (H) 601 (H) 093 (H) 258 (H) 238 (H)   Review of Glycemic Control  Diabetes history: DM2 Outpatient Diabetes medications: Metformin 1000 mg BID Current orders for Inpatient glycemic control: Semglee 5 units daily, Novolog 0-9 units TID with meals, Novolog 0-5 units QHS   Inpatient Diabetes Program Recommendations:     HbgA1C:  A1C 10.6% on 01/13/23 indicating an average glucose of 258 mg/dl over the past 2-3 months. Patient reported he just started taking Metformin consistently about 2 months ago, has been under a great deal of stress, and had been eating excessive carbohydrates in order to gain weight back.    Outpatient DM: May want to consider ordering additional oral DM medication at time of discharge and have patient follow up with PCP.   Thanks, Orlando Penner, RN, MSN, CDCES Diabetes Coordinator Inpatient Diabetes Program [phone] (Team Pager from 8am to 5pm)

## 2023-01-16 NOTE — Plan of Care (Signed)

## 2023-01-16 NOTE — Plan of Care (Signed)

## 2023-01-16 NOTE — Progress Notes (Signed)
[facility] SURGICAL PROGRESS NOTE (cpt [phone])  Hospital Day(s): 3.   Interval History: Patient seen and examined, no acute events or new complaints overnight. Patient reports he is doing well this morning; read to go home. No abdominal pain, nausea, emesis, fever, chills, weakness. He remains without leukocytosis; WBC 6.1K. Hgb to 7.6; likely equilibrating after transfusion; not having any more blood in stool. Renal function normal; sCr - 1.12; UO - unmeasured. No electrolyte derangements. Diet was advanced to soft diet; tolerating.   Review of Systems:  Constitutional: denies fever, chills  HEENT: denies cough or congestion  Respiratory: denies any shortness of breath  Cardiovascular: denies chest pain or palpitations  Gastrointestinal: denies abdominal pain, N/V, denied bloody stools in last 48 hours Genitourinary: denies burning with urination or urinary frequency Musculoskeletal: denies pain, decreased motor or sensation  Vital signs in last 24 hours: [min-max] current  Temp:  [98.2 F (36.8 C)-98.4 F (36.9 C)] 98.2 F (36.8 C) (12/06 0440) Pulse Rate:  [82-98] 98 (12/06 0440) Resp:  [16-18] 18 (12/06 0440) BP: (123-131)/(72-78) 131/73 (12/06 0440) SpO2:  [100 %] 100 % (12/06 0440)     Height: 5\' 10"  (177.8 cm) Weight: 77.1 kg BMI (Calculated): 24.39   Intake/Output last 2 shifts:  No intake/output data recorded.   Physical Exam:  Constitutional: alert, cooperative and no distress  HENT: normocephalic without obvious abnormality  Eyes: PERRL, EOM's grossly intact and symmetric  Respiratory: breathing non-labored at rest  Cardiovascular: regular rate and sinus rhythm  Gastrointestinal: soft, non-tender, and non-distended. No rebound/guarding Musculoskeletal: no edema or wounds, motor and sensation grossly intact, NT    Labs:     Latest Ref Rng & Units 01/16/2023    5:03 AM 01/15/2023    4:52 AM 01/14/2023    4:06 PM  CBC  WBC 4.0 - 10.5 K/uL 6.1  6.7     Hemoglobin 13.0 - 17.0 g/dL 7.6  7.9  8.2   Hematocrit 39.0 - 52.0 % 22.3  22.4    Platelets 150 - 400 K/uL 198  171        Latest Ref Rng & Units 01/16/2023    5:03 AM 01/15/2023    4:52 AM 01/14/2023    3:05 AM  CMP  Glucose 70 - 99 mg/dL 324  401  027   BUN 8 - 23 mg/dL 13  8  19    Creatinine 0.61 - 1.24 mg/dL 2.53  6.64  4.03   Sodium 135 - 145 mmol/L 136  136  131   Potassium 3.5 - 5.1 mmol/L 4.3  3.2  3.6   Chloride 98 - 111 mmol/L 105  105  101   CO2 22 - 32 mmol/L 26  26  25    Calcium 8.9 - 10.3 mg/dL 8.1  7.5  7.6     Imaging studies: No new pertinent imaging studies   Assessment/Plan:  62 y.o. male with now resolved GI bleeding, likely diverticular source, also with descending colon foreign body thought to be incidental finding   - Continues to have no further evidence of bleeding this morning. Should bleeding recur at some point in the future, would recommend tagged red blood cell scan to localize site of bleeding. We would certainly need a target prior to consider surgical intervention.   - Appreciate GI assistance; holding on colonoscopy given concerns of inflammation near this foreign body seen on imaging which certainly increases perforation risk. He will certainly benefit from outpatient follow up to consider endoscopic  evaluation once recovered.   - Okay to continue soft diet  - Would give Abx; Augmentin BID x14 days and manage similar to perforated diverticulitis   - Monitor H&H; likely equilibrating; transfuse as needed   - Monitor abdominal examination; on-going bowel function - Okay to mobilize - Further management per primary service  - Discharge Planning: Okay for discharge from surgical perspective. We will follow up in 2-3 weeks to recheck.   All of the above findings and recommendations were discussed with the patient, and the medical team, and all of patient's questions were answered to his expressed satisfaction.  -- Lynden Oxford, PA-C   [facility] 01/16/2023, 7:04 AM M-F: 7am - 4pm

## 2023-01-26 ENCOUNTER — Encounter: Payer: Self-pay | Admitting: Internal Medicine

## 2023-01-26 ENCOUNTER — Ambulatory Visit (INDEPENDENT_AMBULATORY_CARE_PROVIDER_SITE_OTHER): Payer: 59 | Admitting: Internal Medicine

## 2023-01-26 VITALS — BP 153/79 | HR 97 | Temp 98.1°F | Ht 70.0 in | Wt 171.8 lb

## 2023-01-26 DIAGNOSIS — K625 Hemorrhage of anus and rectum: Secondary | ICD-10-CM

## 2023-01-26 DIAGNOSIS — K219 Gastro-esophageal reflux disease without esophagitis: Secondary | ICD-10-CM | POA: Diagnosis not present

## 2023-01-26 DIAGNOSIS — N529 Male erectile dysfunction, unspecified: Secondary | ICD-10-CM | POA: Diagnosis not present

## 2023-01-26 DIAGNOSIS — E785 Hyperlipidemia, unspecified: Secondary | ICD-10-CM

## 2023-01-26 DIAGNOSIS — D500 Iron deficiency anemia secondary to blood loss (chronic): Secondary | ICD-10-CM | POA: Diagnosis not present

## 2023-01-26 DIAGNOSIS — E119 Type 2 diabetes mellitus without complications: Secondary | ICD-10-CM

## 2023-01-26 DIAGNOSIS — M109 Gout, unspecified: Secondary | ICD-10-CM

## 2023-01-26 DIAGNOSIS — I10 Essential (primary) hypertension: Secondary | ICD-10-CM | POA: Diagnosis not present

## 2023-01-26 DIAGNOSIS — M1A09X0 Idiopathic chronic gout, multiple sites, without tophus (tophi): Secondary | ICD-10-CM

## 2023-01-26 DIAGNOSIS — Z7984 Long term (current) use of oral hypoglycemic drugs: Secondary | ICD-10-CM

## 2023-01-26 DIAGNOSIS — Z1159 Encounter for screening for other viral diseases: Secondary | ICD-10-CM

## 2023-01-26 MED ORDER — SILDENAFIL CITRATE 100 MG PO TABS: 100.0000 mg | ORAL_TABLET | ORAL | 2 refills | Status: DC | PRN

## 2023-01-26 MED ORDER — FAMOTIDINE 40 MG PO TABS: 40.0000 mg | ORAL_TABLET | Freq: Every day | ORAL | 11 refills | Status: DC | PRN

## 2023-01-26 MED ORDER — GLIPIZIDE 5 MG PO TABS: 5.0000 mg | ORAL_TABLET | Freq: Two times a day (BID) | ORAL | 11 refills | Status: DC

## 2023-01-26 NOTE — Assessment & Plan Note (Signed)
Pt states he gets acid reflux after eating certain foods or drinking alcohol. He requested PPI for this but advised given his burden of symptoms, a H2B prn is best for him. Advised lifestyle modification including food and alcohol elimination would also resolve this issue for him.

## 2023-01-26 NOTE — Progress Notes (Addendum)
CC: hospital follow up  HPI:  [name] is a 62 y.o. with medical history of HTN, HLD, DMII, ED, Gout presenting to [facility] for a hospital follow up. Pt was admitted from 12/03-12/06 for rectal bleeding. Imaging showed retained foreign body. Pt was treated for diverticulitis with Augmentin. Initial bleeding was 6.7 and pt received pRBC. General surgery and GI were consulted who recommended outpatient follow up as bleeding had resolved.   Please see problem-based list for further details, assessments, and plans.  Past Medical History:  Diagnosis Date   Abnormal EKG 03/29/2021   Acid reflux    Asthma    As a child   Chest pain 08/31/2020   Diabetes mellitus without complication (HCC)    Erectile dysfunction 01/28/2021   Foreign body in descending colon 01/14/2023   Gout    High cholesterol    Hypertension    Hypertensive urgency 03/29/2021    Current Outpatient Medications (Endocrine & Metabolic):    glipiZIDE (GLUCOTROL) 5 MG tablet, Take 1 tablet (5 mg total) by mouth 2 (two) times daily.   metFORMIN (GLUCOPHAGE) 1000 MG tablet, Take 1 tablet (1,000 mg total) by mouth 2 (two) times daily with a meal.  Current Outpatient Medications (Cardiovascular):    amLODipine (NORVASC) 5 MG tablet, Take 1 tablet (5 mg total) by mouth daily for 180 doses.   atorvastatin (LIPITOR) 10 MG tablet, Take 1 tablet (10 mg total) by mouth daily. (Patient not taking: Reported on 03/30/2021)   hydrochlorothiazide (HYDRODIURIL) 25 MG tablet, Take 1 tablet (25 mg total) by mouth daily.   sildenafil (VIAGRA) 100 MG tablet, Take 1 tablet (100 mg total) by mouth as needed for erectile dysfunction.  Current Outpatient Medications (Respiratory):    albuterol (VENTOLIN HFA) 108 (90 Base) MCG/ACT inhaler, Inhale 2 puffs into the lungs every 6 (six) hours as needed for wheezing or shortness of breath.  Current Outpatient Medications (Analgesics):    allopurinol (ZYLOPRIM) 300 MG tablet, Take 1 tablet (300  mg total) by mouth daily.  Current Outpatient Medications (Hematological):    ferrous sulfate 325 (65 FE) MG EC tablet, Take 1 tablet (325 mg total) by mouth every other day.  Current Outpatient Medications (Other):    famotidine (PEPCID) 40 MG tablet, Take 1 tablet (40 mg total) by mouth daily as needed for heartburn or indigestion.   amoxicillin-clavulanate (AUGMENTIN) 875-125 MG tablet, Take 1 tablet by mouth every 12 (twelve) hours.  Review of Systems:  Review of system negative unless stated in the problem list or HPI.    Physical Exam:  Vitals:   01/26/23 1120  BP: (!) 153/79  Pulse: 97  Temp: 98.1 F (36.7 C)  TempSrc: Oral  SpO2: 100%  Weight: 171 lb 12.8 oz (77.9 kg)  Height: 5\' 10"  (1.778 m)   Physical Exam General: NAD HENT: NCAT Lungs: CTAB, no wheeze, rhonchi or rales.  Cardiovascular: Normal heart sounds, no r/m/g, 2+ pulses in all extremities. No LE edema Abdomen: No TTP, normal bowel sounds MSK: No asymmetry or muscle atrophy.  Skin: no lesions noted on exposed skin Neuro: Alert and oriented x4. CN grossly intact Psych: Normal mood and normal affect   Assessment & Plan:   Hypertension Pt with hx of HTN who is supposed to be taking amlodipine 5 mg and hydrochlorothiazide 25 mg daily. He is currently not taking these. Will have him restart both of these given his elevated blood pressure this visit.   Type 2 diabetes mellitus (HCC) A1c 10.6  while patient was in the hospital. Currently only on Metformin 1000 mg BID. 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 Will start glipizide 5 mg BID. Advised to work on lifestyle modifications.  SGLT2 inhibitor are also an option and can be added on if needed. Will follow up in 3 weeks.   Rectal bleeding Pt states this has resolved. Was diagnosed with diverticulitis and discharged with antibiotics but he states he did not  take them given he was feeling well. Imaging showed foreign body in distal descending colon but pt states he only placed a suppository in his rectum and nothing else. No rectal pain or bleeding is reassuring. He has follow up with general surgery next week. Advised to follow up with general surgery and gastroenterology. No current plans for colonoscopy given his diverticulitis. Repeat CBC and iron studies ordered.   Hyperlipidemia LDL 51 in 01/2023. Advised to continue lipitor 10 mg daily given his hx of DMII.   Gout Takes allopurinol as needed for gout. Advised to take this medication regularly as it is used for prevention of gout.   Erectile dysfunction Refilled pt's Viagra at 100 mg daily prn. Advised to work on reducing cardiovascular risk factors to improve long term sexual health.   GERD (gastroesophageal reflux disease) Pt states he gets acid reflux after eating certain foods or drinking alcohol. He requested PPI for this but advised given his burden of symptoms, a H2B prn is best for him. Advised lifestyle modification including food and alcohol elimination would also resolve this issue for him.    See Encounters Tab for problem based charting.  Patient Discussed with Dr. Karrie Meres, MD Eligha Bridegroom. [facility] Internal Medicine Residency, PGY-3

## 2023-01-26 NOTE — Assessment & Plan Note (Signed)
Pt states this has resolved. Was diagnosed with diverticulitis and discharged with antibiotics but he states he did not take them given he was feeling well. Imaging showed foreign body in distal descending colon but pt states he only placed a suppository in his rectum and nothing else. No rectal pain or bleeding is reassuring. He has follow up with general surgery next week. Advised to follow up with general surgery and gastroenterology. No current plans for colonoscopy given his diverticulitis. Repeat CBC and iron studies ordered.

## 2023-01-26 NOTE — Assessment & Plan Note (Signed)
Refilled pt's Viagra at 100 mg daily prn. Advised to work on reducing cardiovascular risk factors to improve long term sexual health.

## 2023-01-26 NOTE — Assessment & Plan Note (Signed)
Pt with hx of HTN who is supposed to be taking amlodipine 5 mg and hydrochlorothiazide 25 mg daily. He is currently not taking these. Will have him restart both of these given his elevated blood pressure this visit.

## 2023-01-26 NOTE — Assessment & Plan Note (Signed)
Takes allopurinol as needed for gout. Advised to take this medication regularly as it is used for prevention of gout.

## 2023-01-26 NOTE — Assessment & Plan Note (Signed)
A1c 10.6 while patient was in the hospital. Currently only on Metformin 1000 mg BID. 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 Will start glipizide 5 mg BID. Advised to work on lifestyle modifications.  SGLT2 inhibitor are also an option and can be added on if needed. Will follow up in 3 weeks.

## 2023-01-26 NOTE — Assessment & Plan Note (Signed)
LDL 51 in 01/2023. Advised to continue lipitor 10 mg daily given his hx of DMII.

## 2023-01-26 NOTE — Patient Instructions (Signed)
Patrick Hawkins, it was a pleasure seeing you today! You endorsed feeling well today. Below are some of the things we talked about this visit. We look forward to seeing you in the follow up appointment!  Today we discussed: Please take metformin and start taking glipizide 5 mg. Check your sugars 3 times daily.   We will check your blood work today.   We will refer your other medications.   I have ordered the following labs today:   Lab Orders         CBC With Diff/Platelet         Iron, TIBC and Ferritin Panel       Referrals ordered today:   Referral Orders  No referral(s) requested today     I have ordered the following medication/changed the following medications:   Stop the following medications: Medications Discontinued During This Encounter  Medication Reason   oxyCODONE-acetaminophen (PERCOCET) 5-325 MG tablet Patient has not taken in last 30 days   diphenhydrAMINE (BENADRYL) 25 MG tablet Patient has not taken in last 30 days   oxyCODONE-acetaminophen (PERCOCET) 5-325 MG tablet Patient has not taken in last 30 days   ofloxacin (FLOXIN) 0.3 % OTIC solution Patient has not taken in last 30 days   sildenafil (VIAGRA) 50 MG tablet Reorder   sildenafil (VIAGRA) 100 MG tablet      Start the following medications: Meds ordered this encounter  Medications   DISCONTD: sildenafil (VIAGRA) 100 MG tablet    Sig: Take 1 tablet (100 mg total) by mouth as needed for erectile dysfunction.    Dispense:  30 tablet    Refill:  2   famotidine (PEPCID) 40 MG tablet    Sig: Take 1 tablet (40 mg total) by mouth daily as needed for heartburn or indigestion.    Dispense:  30 tablet    Refill:  11   sildenafil (VIAGRA) 100 MG tablet    Sig: Take 1 tablet (100 mg total) by mouth as needed for erectile dysfunction.    Dispense:  30 tablet    Refill:  2   glipiZIDE (GLUCOTROL) 5 MG tablet    Sig: Take 1 tablet (5 mg total) by mouth 2 (two) times daily.    Dispense:  60 tablet     Refill:  11     Follow-up: 3 week follow up   Please make sure to arrive 15 minutes prior to your next appointment. If you arrive late, you may be asked to reschedule.   We look forward to seeing you next time. Please call our clinic at [phone] if you have any questions or concerns. The best time to call is Monday-Friday from 9am-4pm, but there is someone available 24/7. If after hours or the weekend, call the main hospital number and ask for the Internal Medicine Resident On-Call. If you need medication refills, please notify your pharmacy one week in advance and they will send [place] a request.  Thank you for letting us take part in your care. Wishing you the best!  Thank you, Gwenevere Abbot, MD

## 2023-01-27 LAB — IRON,TIBC AND FERRITIN PANEL
Ferritin: 42 ng/mL (ref 30–400)
Iron Saturation: 12 % — ABNORMAL LOW (ref 15–55)
Iron: 40 ug/dL (ref 38–169)
Total Iron Binding Capacity: 343 ug/dL (ref 250–450)
UIBC: 303 ug/dL (ref 111–343)

## 2023-01-27 LAB — CBC WITH DIFF/PLATELET
Basophils Absolute: 0.1 10*3/uL (ref 0.0–0.2)
Basos: 1 %
EOS (ABSOLUTE): 0.1 10*3/uL (ref 0.0–0.4)
Eos: 1 %
Hematocrit: 30.9 % — ABNORMAL LOW (ref 37.5–51.0)
Hemoglobin: 9.8 g/dL — ABNORMAL LOW (ref 13.0–17.7)
Immature Grans (Abs): 0 10*3/uL (ref 0.0–0.1)
Immature Granulocytes: 0 %
Lymphocytes Absolute: 1.6 10*3/uL (ref 0.7–3.1)
Lymphs: 24 %
MCH: 28.7 pg (ref 26.6–33.0)
MCHC: 31.7 g/dL (ref 31.5–35.7)
MCV: 90 fL (ref 79–97)
Monocytes Absolute: 0.4 10*3/uL (ref 0.1–0.9)
Monocytes: 6 %
Neutrophils Absolute: 4.5 10*3/uL (ref 1.4–7.0)
Neutrophils: 68 %
Platelets: 365 10*3/uL (ref 150–450)
RBC: 3.42 x10E6/uL — ABNORMAL LOW (ref 4.14–5.80)
RDW: 13.2 % (ref 11.6–15.4)
WBC: 6.6 10*3/uL (ref 3.4–10.8)

## 2023-01-27 LAB — HCV INTERPRETATION

## 2023-01-27 LAB — HCV AB W REFLEX TO QUANT PCR: HCV Ab: NONREACTIVE

## 2023-01-31 NOTE — Progress Notes (Signed)
Internal Medicine Clinic Attending  Case discussed with the resident at the time of the visit.  We reviewed the resident's history and exam and pertinent patient test results.  I agree with the assessment, diagnosis, and plan of care documented in the resident's note.  

## 2023-02-02 ENCOUNTER — Inpatient Hospital Stay: Payer: 59 | Admitting: Surgery

## 2023-02-09 ENCOUNTER — Ambulatory Visit (INDEPENDENT_AMBULATORY_CARE_PROVIDER_SITE_OTHER): Payer: 59 | Admitting: Surgery

## 2023-02-09 ENCOUNTER — Telehealth: Payer: Self-pay

## 2023-02-09 ENCOUNTER — Encounter: Payer: Self-pay | Admitting: Surgery

## 2023-02-09 VITALS — BP 140/80 | HR 88 | Temp 98.0°F | Ht 70.0 in | Wt 166.0 lb

## 2023-02-09 DIAGNOSIS — K922 Gastrointestinal hemorrhage, unspecified: Secondary | ICD-10-CM | POA: Diagnosis not present

## 2023-02-09 DIAGNOSIS — T184XXD Foreign body in colon, subsequent encounter: Secondary | ICD-10-CM | POA: Diagnosis not present

## 2023-02-09 NOTE — Patient Instructions (Addendum)
We will get you scheduled for a CT scan of your abdomen. We will call you about this scan.   We will have you follow up here in 4 weeks for a recheck.

## 2023-02-09 NOTE — Telephone Encounter (Signed)
Spoke with the patient about his CT scan. The patient is scheduled for a CT scan at [facility] on 02/19/23. He will arrive at the [facility] for check in at 9:15 am that morning. Patient is aware of date, time, and instructions.

## 2023-02-10 ENCOUNTER — Encounter: Payer: Self-pay | Admitting: Surgery

## 2023-02-10 NOTE — Progress Notes (Signed)
Outpatient Surgical Follow Up    Patrick Hawkins is an 62 y.o. male.   Chief Complaint  Patient presents with   New Patient (Initial Visit)    GI Bleed    HPI: Patrick Hawkins is a 62 year old male well-known to me with recent history of hospitalization for lower GI bleed presumably from diverticulosis.  He did have also incidental findings of a foreign body within the sigmoid colon that was asymptomatic.  He has completed antibiotic therapy.  He denies any further bleeding.  He also denies any further dyspnea on exertion that was the main symptoms that brought him to the ER.  No fevers no chills.  He does have a follow-up appointment with GI.  Past Medical History:  Diagnosis Date   Abnormal EKG 03/29/2021   Acid reflux    Asthma    As a child   Chest pain 08/31/2020   Diabetes mellitus without complication (HCC)    Erectile dysfunction 01/28/2021   Foreign body in descending colon 01/14/2023   Gout    High cholesterol    Hypertension    Hypertensive urgency 03/29/2021    Past Surgical History:  Procedure Laterality Date   LEFT HEART CATH AND CORONARY ANGIOGRAPHY N/A 08/31/2020   Procedure: LEFT HEART CATH AND CORONARY ANGIOGRAPHY;  Surgeon: Tonny Bollman, MD;  Location: [facility] INVASIVE CV LAB;  Service: Cardiovascular;  Laterality: N/A;    Family History  Problem Relation Age of Onset   Diabetes Mother    Diabetes Father    Diabetes Sister    Diabetes Sister    Diabetes Brother    Parkinson's disease Brother     Social History:  reports that he has quit smoking. His smoking use included cigars. He has been exposed to tobacco smoke. He has never used smokeless tobacco. He reports current alcohol use of about 18.0 standard drinks of alcohol per week. He reports that he does not use drugs.  Allergies:  Allergies  Allergen Reactions   Ace Inhibitors Swelling    Angioedema 08/31/2020   Cat Hair Extract Shortness Of Breath   Losartan Swelling   Shellfish Allergy Anaphylaxis and  Swelling    Other reaction(s): Angioedema   Lisinopril     Medications reviewed.    ROS Full ROS performed and is otherwise negative other than what is stated in HPI   BP (!) 140/80   Pulse 88   Temp 98 F (36.7 C)   Ht 5\' 10"  (1.778 m)   Wt 166 lb (75.3 kg)   SpO2 99%   BMI 23.82 kg/m   Physical Exam Vitals and nursing note reviewed.  Constitutional:      General: He is not in acute distress.    Appearance: Normal appearance. He is not ill-appearing.  Pulmonary:     Effort: Pulmonary effort is normal.     Breath sounds: No stridor.  Abdominal:     General: Abdomen is flat. There is no distension.     Palpations: Abdomen is soft. There is no mass.     Tenderness: There is no abdominal tenderness. There is no rebound.     Hernia: No hernia is present.  Musculoskeletal:        General: Normal range of motion.     Cervical back: Normal range of motion and neck supple. No rigidity.  Skin:    General: Skin is warm and dry.     Capillary Refill: Capillary refill takes less than 2 seconds.  Neurological:  General: No focal deficit present.     Mental Status: He is alert and oriented to person, place, and time.  Psychiatric:        Mood and Affect: Mood normal.        Behavior: Behavior normal.        Thought Content: Thought content normal.        Judgment: Judgment normal.       Assessment/Plan: 62 year old male with recent episode of recurrent lower GI bleed likely from diverticular bleed.  He did have an incidental findings on the CT scan consistent with a foreign body in the sigmoid colon.  He continues to endorse no abdominal pain or other symptoms.  We will continue to follow him up and we will obtain a CT scan of the abdomen and pelvis with and without contrast to follow the progression of the foreign body in the sigmoid colon.  He will also need a colonoscopy.   He continues to improve with the glycemic control although not ideal.  He is hesitant about  insulin therapy.  Currently no need for surgical intervention.  Please note that I spent 30 minutes in this encounter including person reviewing extensive medical records, personally reviewing images, coordinating his care, placing orders and performing documentation.  Sterling Big, MD [facility] General Surgeon

## 2023-02-19 ENCOUNTER — Ambulatory Visit: Admission: RE | Admit: 2023-02-19 | Payer: Self-pay | Source: Ambulatory Visit

## 2023-02-23 ENCOUNTER — Encounter: Payer: 59 | Admitting: Internal Medicine

## 2023-02-24 ENCOUNTER — Other Ambulatory Visit: Payer: Self-pay

## 2023-02-24 ENCOUNTER — Ambulatory Visit
Admission: RE | Admit: 2023-02-24 | Discharge: 2023-02-24 | Disposition: A | Discharge status: Home / Self Care | Payer: No Typology Code available for payment source | Source: Ambulatory Visit | Attending: Surgery | Admitting: Surgery

## 2023-02-24 ENCOUNTER — Ambulatory Visit
Admission: EM | Admit: 2023-02-24 | Discharge: 2023-02-24 | Disposition: A | Discharge status: Home / Self Care | Payer: No Typology Code available for payment source | Attending: Emergency Medicine | Admitting: Emergency Medicine

## 2023-02-24 ENCOUNTER — Encounter: Payer: Self-pay | Admitting: *Deleted

## 2023-02-24 ENCOUNTER — Telehealth: Payer: Self-pay

## 2023-02-24 DIAGNOSIS — M109 Gout, unspecified: Secondary | ICD-10-CM | POA: Diagnosis not present

## 2023-02-24 DIAGNOSIS — T184XXD Foreign body in colon, subsequent encounter: Secondary | ICD-10-CM | POA: Diagnosis present

## 2023-02-24 DIAGNOSIS — K922 Gastrointestinal hemorrhage, unspecified: Secondary | ICD-10-CM | POA: Diagnosis present

## 2023-02-24 MED ORDER — PREDNISONE 20 MG PO TABS: 40.0000 mg | ORAL_TABLET | Freq: Every day | ORAL | 0 refills | Status: DC

## 2023-02-24 MED ORDER — IOHEXOL 9 MG/ML PO SOLN
500.0000 mL | ORAL | Status: AC
  Administered 2023-02-24 (×2): 500 mL via ORAL

## 2023-02-24 MED ORDER — IOHEXOL 300 MG/ML  SOLN
85.0000 mL | Freq: Once | INTRAMUSCULAR | Status: AC | PRN
  Administered 2023-02-24: 85 mL via INTRAVENOUS

## 2023-02-24 MED ORDER — METHYLPREDNISOLONE ACETATE 80 MG/ML IJ SUSP
80.0000 mg | Freq: Once | INTRAMUSCULAR | Status: AC
  Administered 2023-02-24: 80 mg via INTRAMUSCULAR

## 2023-02-24 NOTE — Progress Notes (Deleted)
 Brigitte Canard, PA-C [address]  [address]  [place], [place] [address]  Main: [phone]  Fax: [phone]   Gastroenterology Consultation  Referring Provider:     Karalee Oscar, DO Primary Care Physician:  Karalee Oscar, DO Primary Gastroenterologist:  *** Reason for Consultation:     Rectal Bleeding        HPI:   Patrick Hawkins is a 63 y/o y.o. y/o male referred for consultation & management  by Karalee Oscar, DO.    Patient was hospitalized 01/13/2023 to 01/16/2023 for rectal bleeding for 2 days..  Also had acute blood loss anemia (Hgb 7.0) and foreign body in the colon.  He was diagnosed with diverticular bleed.  Required 1 unit PRBC blood transfusion.  Bleeding resolved with conservative treatment.  CTA showed possible foreign body in the colon with surrounding inflammation, no perforation.  Possible artifact.  General surgery and GI advised treating with Augmentin  for 2 weeks to treat like diverticulitis.  Is here for outpatient follow-up.  Here to discuss repeat scan versus colonoscopy.  Last lab 01/26/23 showed improved Hgb 9.8g.  Patient saw Dr. Dana Duncan general surgeon for follow-up 02/09/2023.  Patient had repeat abdominal pelvic CT with and without contrast yesterday 02/24/2023, results pending.  Patient has never had a colonoscopy.  No family history of colon cancer.  Medical history significant of hypertension, hyperlipidemia, diabetes mellitus, gout, GERD, and alcohol abuse   Past Medical History:  Diagnosis Date   Abnormal EKG 03/29/2021   Acid reflux    Asthma    As a child   Chest pain 08/31/2020   Diabetes mellitus without complication (HCC)    Erectile dysfunction 01/28/2021   Foreign body in descending colon 01/14/2023   Gout    High cholesterol    Hypertension    Hypertensive urgency 03/29/2021    Past Surgical History:  Procedure Laterality Date   LEFT HEART CATH AND CORONARY ANGIOGRAPHY N/A 08/31/2020   Procedure: LEFT HEART CATH AND  CORONARY ANGIOGRAPHY;  Surgeon: Arnoldo Lapping, MD;  Location: [facility] INVASIVE CV LAB;  Service: Cardiovascular;  Laterality: N/A;    Prior to Admission medications   Medication Sig Start Date End Date Taking? Authorizing Provider  albuterol  (VENTOLIN  HFA) 108 (90 Base) MCG/ACT inhaler Inhale 2 puffs into the lungs every 6 (six) hours as needed for wheezing or shortness of breath. 08/31/20   Lena Qualia, MD  amLODipine  (NORVASC ) 5 MG tablet Take 5 mg by mouth daily.    [provider]  atorvastatin  (LIPITOR) 10 MG tablet Take 1 tablet (10 mg total) by mouth daily. Patient not taking: Reported on 03/30/2021 01/28/21 07/27/21  Lillia Reilly, MD  famotidine  (PEPCID ) 40 MG tablet Take 1 tablet (40 mg total) by mouth daily as needed for heartburn or indigestion. 01/26/23 01/26/24  Jackolyn Masker, MD  ferrous sulfate  325 (65 FE) MG EC tablet Take 1 tablet (325 mg total) by mouth every other day. 01/16/23 01/16/24  Wouk, Haynes Lips, MD  glipiZIDE  (GLUCOTROL ) 5 MG tablet Take 1 tablet (5 mg total) by mouth 2 (two) times daily. 01/26/23 01/26/24  Jackolyn Masker, MD  metFORMIN  (GLUCOPHAGE ) 1000 MG tablet Take 1 tablet (1,000 mg total) by mouth 2 (two) times daily with a meal. 08/18/22   Masters, Katie, DO  predniSONE  (DELTASONE ) 20 MG tablet Take 2 tablets (40 mg total) by mouth daily. 02/24/23   Reena Canning, NP  sildenafil  (VIAGRA ) 100 MG tablet Take 1 tablet (100 mg total) by mouth  as needed for erectile dysfunction. 01/26/23 04/26/23  Jackolyn Masker, MD  traZODone  (DESYREL ) 50 MG tablet Take 1 tablet (50 mg total) by mouth at bedtime. 05/30/19 03/08/20  LampteyDonley Furth, MD    Family History  Problem Relation Age of Onset   Diabetes Mother    Diabetes Father    Diabetes Sister    Diabetes Sister    Diabetes Brother    Parkinson's disease Brother      Social History   Tobacco Use   Smoking status: Former    Types: Cigars    Passive exposure: Past   Smokeless tobacco: Never  Vaping Use    Vaping status: Never Used  Substance Use Topics   Alcohol use: Yes    Alcohol/week: 18.0 standard drinks of alcohol    Types: 18 Cans of beer per week   Drug use: Never    Allergies as of 02/25/2023 - Review Complete 02/24/2023  Allergen Reaction Noted   Ace inhibitors Swelling 08/31/2020   Cat hair extract Shortness Of Breath 02/22/2013   Losartan Swelling 09/01/2020   Shellfish allergy Anaphylaxis and Swelling 02/22/2013   Lisinopril   03/30/2021    Review of Systems:    All systems reviewed and negative except where noted in HPI.   Physical Exam:  There were no vitals taken for this visit. No LMP for male patient.  General:   Alert,  Well-developed, well-nourished, pleasant and cooperative in NAD Lungs:  Respirations even and unlabored.  Clear throughout to auscultation.   No wheezes, crackles, or rhonchi. No acute distress. Heart:  Regular rate and rhythm; no murmurs, clicks, rubs, or gallops. Abdomen:  Normal bowel sounds.  No bruits.  Soft, and non-distended without masses, hepatosplenomegaly or hernias noted.  No Tenderness.  No guarding or rebound tenderness.    Neurologic:  Alert and oriented x3;  grossly normal neurologically. Psych:  Alert and cooperative. Normal mood and affect.  Imaging Studies: No results found.  Assessment and Plan:   [name] is a 63 y/o y.o. y/o male has been referred for   1.  Presumed diverticular bleed  2.  Acute blood loss anemia requiring hospitalization and blood transfusion 12/3 - 01/16/2023.  3.  Abnormal CT scan of the distal descending colon; treated with antibiotics for diverticulitis 01/13/2023.  Follow up ***  Brigitte Canard, PA-C    BP check ***

## 2023-02-24 NOTE — ED Triage Notes (Signed)
 Pt reports a Hx of Gout and a flair started approx. One weeks ago. Pt has been taking Ibuprofen for pain.

## 2023-02-24 NOTE — ED Provider Notes (Signed)
 Patrick Hawkins    CSN: [id number] Arrival date & time: 02/24/23  1829      History   Chief Complaint Chief Complaint  Patient presents with   Gout    HPI Patrick Hawkins is a 63 y.o. male.   Patient presents for evaluation of pain and swelling to the left great toe beginning 7 to 10 days ago.  Endorses increased consumption of red meat which is most likely trigger.  Unable to bear weight due to pain elicited.  Denies numbness, tingling or injury.  Has been using 400 mg of ibuprofen  every 4 hours with minimal relief.  History of gout, did attempt to reach his personal doctor but has not gotten response.  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 Authorizing Provider  albuterol  (VENTOLIN  HFA) 108 (90 Base) MCG/ACT inhaler Inhale 2 puffs  into the lungs every 6 (six) hours as needed for wheezing or shortness of breath. 08/31/20  Yes Claudene Reeves A, MD  famotidine  (PEPCID ) 40 MG tablet Take 1 tablet (40 mg total) by mouth daily as needed for heartburn or indigestion. 01/26/23 01/26/24 Yes Fernand Prost, MD  ferrous sulfate  325 (65 FE) MG EC tablet Take 1 tablet (325 mg total) by mouth every other day. 01/16/23 01/16/24 Yes Wouk, Devaughn Sayres, MD  glipiZIDE  (GLUCOTROL ) 5 MG tablet Take 1 tablet (5 mg total) by mouth 2 (two) times daily. 01/26/23 01/26/24 Yes Fernand Prost, MD  metFORMIN  (GLUCOPHAGE ) 1000 MG tablet Take 1 tablet (1,000 mg total) by mouth 2 (two) times daily with a meal. 08/18/22  Yes Masters, Katie, DO  predniSONE  (DELTASONE ) 20 MG tablet Take 2 tablets (40 mg total) by mouth daily. 02/24/23  Yes Amire Leazer, Shelba SAUNDERS, NP  sildenafil  (VIAGRA ) 100 MG tablet Take 1 tablet (100 mg total) by mouth as needed for erectile dysfunction. 01/26/23 04/26/23 Yes Fernand Prost, MD  amLODipine  (NORVASC ) 5 MG tablet Take 5 mg by mouth daily.    [provider]  atorvastatin  (LIPITOR) 10 MG tablet Take 1 tablet (10 mg total) by mouth daily. Patient not taking: Reported on 03/30/2021 01/28/21 07/27/21  Golda Lynwood PARAS, MD  traZODone  (DESYREL ) 50 MG tablet Take 1 tablet (50 mg total) by mouth at bedtime. 05/30/19 03/08/20  Blaise Mungo  MALVA, MD    Family History Family History  Problem Relation Age of Onset   Diabetes Mother    Diabetes Father    Diabetes Sister    Diabetes Sister    Diabetes Brother    Parkinson's disease Brother     Social History Social History   Tobacco Use   Smoking status: Former    Types: Cigars    Passive exposure: Past   Smokeless tobacco: Never  Vaping Use   Vaping status: Never Used  Substance Use Topics   Alcohol use: Yes    Alcohol/week: 18.0 standard drinks of alcohol    Types: 18 Cans of beer per week   Drug use: Never     Allergies   Ace inhibitors, Cat hair extract, Losartan, Shellfish  allergy, and Lisinopril    Review of Systems Review of Systems   Physical Exam Triage Vital Signs ED Triage Vitals  Encounter Vitals Group     BP 02/24/23 1910 (!) 141/86     Systolic BP Percentile --      Diastolic BP Percentile --      Pulse Rate 02/24/23 1910 91     Resp 02/24/23 1910 18     Temp 02/24/23 1910 99 F (37.2 C)     Temp src --      SpO2 02/24/23 1910 100 %     Weight --      Height --      Head Circumference --      Peak Flow --      Pain Score 02/24/23 1907 9     Pain Loc --      Pain Education --      Exclude from Growth Chart --    No data found.  Updated Vital Signs BP (!) 141/86   Pulse 91   Temp 99 F (37.2 C)   Resp 18   SpO2 100%   Visual Acuity Right Eye Distance:   Left Eye Distance:   Bilateral Distance:    Right Eye Near:   Left Eye Near:    Bilateral Near:     Physical Exam Constitutional:      Appearance: Normal appearance.  Eyes:     Extraocular Movements: Extraocular movements intact.  Pulmonary:     Effort: Pulmonary effort is normal.  Skin:    Comments: Erythema, moderate swelling and tenderness present to the lateral and anterior aspect of the left great toe, station intact, capillary refill less than 3, painful to bear weight and to complete range of motion, 2+ pedal pulse  Neurological:     Mental Status: He is alert and oriented to person, place, and time. Mental status is at baseline.      UC Treatments / Results  Labs (all labs ordered are listed, but only abnormal results are displayed) Labs Reviewed - No data to display  EKG   Radiology No results found.  Procedures Procedures (including critical care time)  Medications Ordered in UC Medications  methylPREDNISolone  acetate (DEPO-MEDROL ) injection 80 mg (80 mg Intramuscular Given 02/24/23 1926)    Initial Impression / Assessment and Plan / UC Course  I have reviewed the triage vital signs and the nursing notes.  Pertinent labs & imaging  results that were available during my care of the patient were reviewed by me and considered in my medical decision making (see chart for details).   Gouty arthritis of left great toe  Presentation and symptomology consistent with above diagnosis, discussed with patient,  due to lack of injury deferring imaging, methylprednisolone  injection given and prescribed oral prednisone  for outpatient use recommended supportive care through [facility] with follow-up with urgent care or PCP as needed Final Clinical Impressions(s) / UC Diagnoses   Final diagnoses:  Gouty arthritis of left great toe     Discharge Instructions      You are being treated for gout  You have been given a high-dose injection of steroids to help reduce pain and swelling and ideally will see improvement within the hour  Starting tomorrow take oral prednisone  every 8 hours as needed until symptoms clear, you have been given 3 days of medication  You may use ice or heat over the affected area in 10 to 15-minute intervals  You may elevate whenever sitting and lying to help reduce swelling  You may continue activity as tolerated  You may follow-up with his urgent care as needed for any further concerns   ED Prescriptions     Medication Sig Dispense Auth. Provider   predniSONE  (DELTASONE ) 20 MG tablet Take 2 tablets (40 mg total) by mouth daily. 18 tablet Satia Winger R, NP      PDMP not reviewed this encounter.   Teresa Shelba SAUNDERS, NP 02/24/23 1943

## 2023-02-24 NOTE — Discharge Instructions (Addendum)
 You are being treated for gout  You have been given a high-dose injection of steroids to help reduce pain and swelling and ideally will see improvement within the hour  Starting tomorrow take oral prednisone  every 8 hours as needed until symptoms clear, you have been given 3 days of medication  You may use ice or heat over the affected area in 10 to 15-minute intervals  You may elevate whenever sitting and lying to help reduce swelling  You may continue activity as tolerated  You may follow-up with his urgent care as needed for any further concerns

## 2023-02-24 NOTE — Telephone Encounter (Signed)
 Requesting Prednisone   40mg  to be filled, pt states he use this medication for gout flare-up. States flare-up is on his knee, ankle and toe. Pt has upcoming appt with Dr. Kenn on 1/15. Requesting medication before his appt with Dr. Kenn. Please call pt back.

## 2023-02-25 ENCOUNTER — Ambulatory Visit: Payer: 59 | Admitting: Physician Assistant

## 2023-02-25 ENCOUNTER — Ambulatory Visit (INDEPENDENT_AMBULATORY_CARE_PROVIDER_SITE_OTHER): Payer: No Typology Code available for payment source | Admitting: Student

## 2023-02-25 VITALS — BP 144/83 | HR 85 | Temp 98.1°F | Ht 70.0 in | Wt 170.9 lb

## 2023-02-25 DIAGNOSIS — R61 Generalized hyperhidrosis: Secondary | ICD-10-CM

## 2023-02-25 DIAGNOSIS — Z23 Encounter for immunization: Secondary | ICD-10-CM | POA: Diagnosis not present

## 2023-02-25 DIAGNOSIS — I10 Essential (primary) hypertension: Secondary | ICD-10-CM

## 2023-02-25 DIAGNOSIS — D500 Iron deficiency anemia secondary to blood loss (chronic): Secondary | ICD-10-CM

## 2023-02-25 DIAGNOSIS — E119 Type 2 diabetes mellitus without complications: Secondary | ICD-10-CM

## 2023-02-25 MED ORDER — SYNJARDY 5-1000 MG PO TABS: 1.0000 | ORAL_TABLET | Freq: Two times a day (BID) | ORAL | 3 refills | Status: DC

## 2023-02-25 NOTE — Assessment & Plan Note (Signed)
 Diabetes poorly controlled with last A1c greater than 10.  Patient has been on a regimen of metformin  1 g twice daily, and was also scribed glipizide  5 mg twice daily.  He states that he did not pick this medication up until yesterday, and has not been taking it.  He has been taking his blood sugars at home and says he is usually in the 150s, and feels like he is better controlled and taking his diabetes more seriously.  He got a good amount of diabetes education in the hospital during his admission which she felt was very helpful.  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  He states that due to his profession there are limitations on insulin  use.   Plan: Referral for diabetic eye exam Will initiate Synjardy  [phone] twice daily for diabetes and hypertension management, discontinue glipizide . Follow-up in 2 months for repeat A1c, diabetic foot exam Lipid profile Microalbumin creatinine ratio Patient has a history of ACE inhibitor intolerance, angioedema.

## 2023-02-25 NOTE — Patient Instructions (Signed)
 Thank you, Patrick Hawkins for allowing us  to provide your care today.  I have ordered the following tests for you:   Lab Orders         CBC no Diff         Lipid Profile         Microalbumin / Creatinine Urine Ratio       Referrals ordered today:    Referral Orders         Ambulatory referral to Ophthalmology      I have ordered the following medication/changed the following medications:   Stop the following medications: Medications Discontinued During This Encounter  Medication Reason   metFORMIN  (GLUCOPHAGE ) 1000 MG tablet    glipiZIDE  (GLUCOTROL ) 5 MG tablet      Start the following medications: Meds ordered this encounter  Medications   Empagliflozin-metFORMIN  HCl (SYNJARDY ) 06-998 MG TABS    Sig: Take 1 tablet by mouth 2 (two) times daily.    Dispense:  60 tablet    Refill:  3       Follow up: 2 months for A1c and diabetes    We look forward to seeing you next time. Please call our clinic at [phone] if you have any questions or concerns. The best time to call is Monday-Friday from 9am-4pm, but there is someone available 24/7. If after hours or the weekend, call the main hospital number and ask for the Internal Medicine Resident On-Call. If you need medication refills, please notify your pharmacy one week in advance and they will send us  a request.   Thank you for trusting me with your care. Wishing you the best!  Sheree Dieter MD [facility]

## 2023-02-25 NOTE — Assessment & Plan Note (Signed)
 Recent mission for diverticulosis requiring transfusion.  Hemoglobin as low as 6.7 during that admission, and steadily rising following.  He does not endorse any recent bleeding or melena.  Discontinued his iron pills after finishing them as they made him feel odd/GI upset.  He does endorse some exertional dyspnea which is ongoing, but otherwise feels relatively well. Plan: CBC today to follow-up on anemia

## 2023-02-25 NOTE — Assessment & Plan Note (Signed)
 Continues to endorse chronic night sweats, though slightly improved.  Extensive workup in the including HIV, QuantiFERON gold, chest x-ray, TSH were negative in the past.  Will continue to monitor.

## 2023-02-25 NOTE — Assessment & Plan Note (Signed)
 Patient currently on amlodipine  5 mg, hypertensive to 144/83 today in clinic.  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  Shared decision making today regarding management of his blood pressure medications, will wait until his microalbumin to creatinine ratio comes back before making any further adjustments.  He is also starting Synjardy , which may benefit his blood pressure due to SGLT2 action. Plan: Begin Synjardy  Microalbumin creatinine ratio Titrate blood pressure medications at following visits

## 2023-02-25 NOTE — Progress Notes (Signed)
 Subjective:  CC: Diabetes, HTN  HPI:  Patrick Hawkins is a 63 y.o. person with a past medical history stated below and presents today for the stated chief complaint. Please see problem based assessment and plan for additional details.  Past Medical History:  Diagnosis Date   Abnormal EKG 03/29/2021   Acid reflux    Asthma    As a child   Chest pain 08/31/2020   Diabetes mellitus without complication (HCC)    Erectile dysfunction 01/28/2021   Foreign body in descending colon 01/14/2023   Gout    High cholesterol    Hypertension    Hypertensive urgency 03/29/2021    Current Outpatient Medications on File Prior to Visit  Medication Sig Dispense Refill   albuterol  (VENTOLIN  HFA) 108 (90 Base) MCG/ACT inhaler Inhale 2 puffs into the lungs every 6 (six) hours as needed for wheezing or shortness of breath. 8 g 0   amLODipine  (NORVASC ) 5 MG tablet Take 5 mg by mouth daily.     atorvastatin  (LIPITOR) 10 MG tablet Take 1 tablet (10 mg total) by mouth daily. (Patient not taking: Reported on 03/30/2021) 180 tablet 1   famotidine  (PEPCID ) 40 MG tablet Take 1 tablet (40 mg total) by mouth daily as needed for heartburn or indigestion. 30 tablet 11   ferrous sulfate  325 (65 FE) MG EC tablet Take 1 tablet (325 mg total) by mouth every other day. 60 tablet 1   predniSONE  (DELTASONE ) 20 MG tablet Take 2 tablets (40 mg total) by mouth daily. 18 tablet 0   sildenafil  (VIAGRA ) 100 MG tablet Take 1 tablet (100 mg total) by mouth as needed for erectile dysfunction. 30 tablet 2   [DISCONTINUED] traZODone  (DESYREL ) 50 MG tablet Take 1 tablet (50 mg total) by mouth at bedtime. 30 tablet 1   No current facility-administered medications on file prior to visit.    Review of Systems: Please see assessment and plan for pertinent positives and negatives.  Objective:   Vitals:   02/25/23 1441 02/25/23 1513  BP: (!) 141/86 (!) 144/83  Pulse: 88 85  Temp: 98.1 F (36.7 C)   TempSrc: Oral   SpO2:  100%   Weight: 170 lb 14.4 oz (77.5 kg)   Height: 5\' 10"  (1.778 m)     Physical Exam: Constitutional: Well-appearing Cardiovascular: Regular rate and rhythm, no rubs gallops or murmurs Pulmonary/Chest: lungs clear to auscultation bilaterally Abdominal: soft, non-tender, non-distended Extremities: No edema of the lower extremities bilaterally Psych: Pleasant affect Thought process is linear and is goal-directed.     Assessment & Plan:  Chronic night sweats Continues to endorse chronic night sweats, though slightly improved.  Extensive workup in the including HIV, QuantiFERON gold, chest x-ray, TSH were negative in the past.  Will continue to monitor.  Blood loss anemia Recent mission for diverticulosis requiring transfusion.  Hemoglobin as low as 6.7 during that admission, and steadily rising following.  He does not endorse any recent bleeding or melena.  Discontinued his iron pills after finishing them as they made him feel odd/GI upset.  He does endorse some exertional dyspnea which is ongoing, but otherwise feels relatively well. Plan: CBC today to follow-up on anemia   Type 2 diabetes mellitus (HCC) Diabetes poorly controlled with last A1c greater than 10.  Patient has been on a regimen of metformin  1 g twice daily, and was also scribed glipizide  5 mg twice daily.  He states that he did not pick this medication up until yesterday,  and has not been taking it.  He has been taking his blood sugars at home and says he is usually in the 150s, and feels like he is better controlled and taking his diabetes more seriously.  He got a good amount of diabetes education in the hospital during his admission which she felt was very helpful.  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  He states that due to his profession there are limitations on insulin  use.   Plan: Referral for diabetic eye exam Will initiate Synjardy  06-998 twice daily for diabetes and  hypertension management, discontinue glipizide . Follow-up in 2 months for repeat A1c, diabetic foot exam Lipid profile Microalbumin creatinine ratio Patient has a history of ACE inhibitor intolerance, angioedema.   Hypertension Patient currently on amlodipine  5 mg, hypertensive to 144/83 today in clinic.  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  Shared decision making today regarding management of his blood pressure medications, will wait until his microalbumin to creatinine ratio comes back before making any further adjustments.  He is also starting Synjardy , which may benefit his blood pressure due to SGLT2 action. Plan: Begin Synjardy  Microalbumin creatinine ratio Titrate blood pressure medications at following visits    Patient discussed with Dr. Meldon Sport MD [facility]  [id number] Pager: [phone]  Phone: [phone] Date 02/25/2023  Time 3:54 PM

## 2023-02-26 LAB — LIPID PANEL
Chol/HDL Ratio: 3.9 {ratio} (ref 0.0–5.0)
Cholesterol, Total: 188 mg/dL (ref 100–199)
HDL: 48 mg/dL (ref >39)
LDL Chol Calc (NIH): 105 mg/dL — ABNORMAL HIGH (ref 0–99)
Triglycerides: 204 mg/dL — ABNORMAL HIGH (ref 0–149)
VLDL Cholesterol Cal: 35 mg/dL (ref 5–40)

## 2023-02-26 LAB — CBC
Hematocrit: 38.7 % (ref 37.5–51.0)
Hemoglobin: 12.5 g/dL — ABNORMAL LOW (ref 13.0–17.7)
MCH: 28.5 pg (ref 26.6–33.0)
MCHC: 32.3 g/dL (ref 31.5–35.7)
MCV: 88 fL (ref 79–97)
Platelets: 323 10*3/uL (ref 150–450)
RBC: 4.38 x10E6/uL (ref 4.14–5.80)
RDW: 12.7 % (ref 11.6–15.4)
WBC: 6.5 10*3/uL (ref 3.4–10.8)

## 2023-02-26 LAB — MICROALBUMIN / CREATININE URINE RATIO
Creatinine, Urine: 243.6 mg/dL
Microalb/Creat Ratio: 8 mg/g{creat} (ref 0–29)
Microalbumin, Urine: 18.4 ug/mL

## 2023-02-27 NOTE — Progress Notes (Signed)
 Internal Medicine Clinic Attending  Case discussed with the resident at the time of the visit.  We reviewed the resident's history and exam and pertinent patient test results.  I agree with the assessment, diagnosis, and plan of care documented in the resident's note.

## 2023-03-02 ENCOUNTER — Encounter: Payer: Self-pay | Admitting: Student

## 2023-03-04 ENCOUNTER — Telehealth: Payer: Self-pay | Admitting: Internal Medicine

## 2023-03-04 DIAGNOSIS — K219 Gastro-esophageal reflux disease without esophagitis: Secondary | ICD-10-CM

## 2023-03-04 NOTE — Telephone Encounter (Signed)
Pt has questions about the following medication     OMEPRAZOLE (PRILOSEC ORAL) Take by mouth.      Active

## 2023-03-05 MED ORDER — OMEPRAZOLE 40 MG PO CPDR: 40.0000 mg | DELAYED_RELEASE_CAPSULE | Freq: Every day | ORAL | 3 refills | Status: DC

## 2023-03-09 ENCOUNTER — Ambulatory Visit (INDEPENDENT_AMBULATORY_CARE_PROVIDER_SITE_OTHER): Payer: No Typology Code available for payment source | Admitting: Surgery

## 2023-03-09 ENCOUNTER — Encounter: Payer: Self-pay | Admitting: Surgery

## 2023-03-09 ENCOUNTER — Telehealth: Payer: Self-pay

## 2023-03-09 VITALS — BP 129/81 | HR 92 | Temp 98.7°F | Ht 70.0 in | Wt 164.2 lb

## 2023-03-09 DIAGNOSIS — T184XXD Foreign body in colon, subsequent encounter: Secondary | ICD-10-CM | POA: Diagnosis not present

## 2023-03-09 DIAGNOSIS — K922 Gastrointestinal hemorrhage, unspecified: Secondary | ICD-10-CM | POA: Diagnosis not present

## 2023-03-09 DIAGNOSIS — Z09 Encounter for follow-up examination after completed treatment for conditions other than malignant neoplasm: Secondary | ICD-10-CM

## 2023-03-09 NOTE — Telephone Encounter (Signed)
The patient called in to reschedule his office visit.

## 2023-03-09 NOTE — Patient Instructions (Signed)
Please call GI to schedule a colonoscopy.   Rectal Bleeding  Rectal bleeding is when blood comes out of the opening of the butt (anus). You may see bright red blood in your underwear or in the toilet after you poop (have a bowel movement). You may also have blood mixed with your poop (stool), or dark red or black poop. Rectal bleeding is often a sign that something is wrong. It can be caused by many things. It needs to be checked by a doctor. Follow these instructions at home: Medicines Take over-the-counter and prescription medicines only as told by your doctor. Ask your doctor about changing or stopping your normal medicines. These include blood thinners. Managing constipation Your condition may cause trouble pooping (constipation). To prevent or treat this, or to help make your poop soft, you may need to: Drink enough fluid to keep your pee (urine) pale yellow. Take over-the-counter or prescription medicines. Eat foods that are high in fiber. These include beans, whole grains, and fresh fruits and vegetables. Limit foods that are high in fat and sugar. These include fried or sweet foods.  General instructions Try not to strain when you poop. Take a warm bath. This may help with pain. Watch for changes in your symptoms. Contact a doctor if: You have pain or swelling in your belly (abdomen). You have a fever. You feel weak or like you may vomit. You cannot poop. You have new or more bleeding. You have black or dark red poop. You vomit blood or something that looks like coffee grounds. Get help right away if: You faint. You have very bad pain in your butt. These symptoms may be an emergency. Get help right away. Call 911. Do not wait to see if the symptoms will go away. Do not drive yourself to the hospital. This information is not intended to replace advice given to you by your health care provider. Make sure you discuss any questions you have with your health care  provider. Document Revised: 09/17/2021 Document Reviewed: 09/17/2021 Elsevier Patient Education  2024 [employer].

## 2023-03-10 ENCOUNTER — Encounter: Payer: Self-pay | Admitting: Surgery

## 2023-03-10 NOTE — Progress Notes (Signed)
Outpatient Surgical Follow Up  03/10/2023  Patrick Hawkins is an 63 y.o. male.   Chief Complaint  Patient presents with   Follow-up    GI bleeding    HPI: 63 year old male well-known to me with recent history of hospitalization for lower GI bleed presumably from diverticulosis. He did have also incidental findings of a foreign body within the sigmoid colon that was asymptomatic. No abdominal pain. He denies any further bleeding. He also denies any further dyspnea on exertion that was the main symptoms that brought him to the ER.  Repeat CT scan pers reviewed showed persistent FB sigmoid colon w/o complicating features. HE does have a sizable hiatal hernia type III, some occasional reflux but no dysphagia or other issues. No fevers no chills.  He missed follow-up appointment with GI.   Past Medical History:  Diagnosis Date   Abnormal EKG 03/29/2021   Acid reflux    Asthma    As a child   Chest pain 08/31/2020   Diabetes mellitus without complication (HCC)    Erectile dysfunction 01/28/2021   Foreign body in descending colon 01/14/2023   Gout    High cholesterol    Hypertension    Hypertensive urgency 03/29/2021    Past Surgical History:  Procedure Laterality Date   LEFT HEART CATH AND CORONARY ANGIOGRAPHY N/A 08/31/2020   Procedure: LEFT HEART CATH AND CORONARY ANGIOGRAPHY;  Surgeon: Tonny Bollman, MD;  Location: [facility] INVASIVE CV LAB;  Service: Cardiovascular;  Laterality: N/A;    Family History  Problem Relation Age of Onset   Diabetes Mother    Diabetes Father    Diabetes Sister    Diabetes Sister    Diabetes Brother    Parkinson's disease Brother     Social History:  reports that he has quit smoking. His smoking use included cigars. He has been exposed to tobacco smoke. He has never used smokeless tobacco. He reports current alcohol use of about 18.0 standard drinks of alcohol per week. He reports that he does not use drugs.  Allergies:  Allergies  Allergen  Reactions   Ace Inhibitors Swelling    Angioedema 08/31/2020   Cat Dander Shortness Of Breath   Losartan Swelling   Shellfish Allergy Anaphylaxis and Swelling    Other reaction(s): Angioedema   Lisinopril     Medications reviewed.    ROS Full ROS performed and is otherwise negative other than what is stated in HPI   BP 129/81   Pulse 92   Temp 98.7 F (37.1 C) (Oral)   Ht 5\' 10"  (1.778 m)   Wt 164 lb 3.2 oz (74.5 kg)   SpO2 99%   BMI 23.56 kg/m   Physical Exam  Vitals and nursing note reviewed.  Constitutional:      General: He is not in acute distress.    Appearance: Normal appearance. He is not ill-appearing.  Pulmonary:     Effort: Pulmonary effort is normal.     Breath sounds: No stridor.  Abdominal:     General: Abdomen is flat. There is no distension.     Palpations: Abdomen is soft. There is no mass.     Tenderness: There is no abdominal tenderness. There is no rebound.     Hernia: No hernia is present.  Musculoskeletal:        General: Normal range of motion.     Cervical back: Normal range of motion and neck supple. No rigidity.  Skin:    General: Skin is warm  and dry.     Capillary Refill: Capillary refill takes less than 2 seconds.  Neurological:     General: No focal deficit present.     Mental Status: He is alert and oriented to person, place, and time.  Psychiatric:        Mood and Affect: Mood normal.        Behavior: Behavior normal.        Thought Content: Thought content normal.        Judgment: Judgment normal.        Assessment/Plan: 63 year old male with recent episode of recurrent lower GI bleed likely from diverticular bleed.  He did have an incidental findings on the CT scan consistent with a foreign body in the sigmoid colon but continues to be asymptomatic and is not doing any harm .  He continues to endorse no abdominal pain or other symptoms. He will also need a colonoscopy given recent recurrent bleed, may benefit from EGD given  hiatal hernia and to have a better characterization. Encourage to f/u w GI. NO need for surgical intervention. We will see prn basis. Please note that I spent 30 minutes in this encounter including person reviewing extensive medical records, personally reviewing images, coordinating his care, placing orders and performing documentation.  Sterling Big, MD [facility] Sur[facility]

## 2023-04-17 LAB — HM DIABETES EYE EXAM

## 2023-04-28 ENCOUNTER — Ambulatory Visit
Admission: EM | Admit: 2023-04-28 | Discharge: 2023-04-28 | Disposition: A | Discharge status: Home / Self Care | Attending: Emergency Medicine | Admitting: Emergency Medicine

## 2023-04-28 ENCOUNTER — Ambulatory Visit: Payer: No Typology Code available for payment source | Admitting: Gastroenterology

## 2023-04-28 ENCOUNTER — Ambulatory Visit: Payer: Self-pay | Admitting: Internal Medicine

## 2023-04-28 DIAGNOSIS — M109 Gout, unspecified: Secondary | ICD-10-CM

## 2023-04-28 DIAGNOSIS — I10 Essential (primary) hypertension: Secondary | ICD-10-CM

## 2023-04-28 MED ORDER — DEXAMETHASONE SODIUM PHOSPHATE 10 MG/ML IJ SOLN
10.0000 mg | Freq: Once | INTRAMUSCULAR | Status: AC
  Administered 2023-04-28: 10 mg via INTRAMUSCULAR

## 2023-04-28 MED ORDER — AMLODIPINE BESYLATE 5 MG PO TABS
5.0000 mg | ORAL_TABLET | Freq: Every day | ORAL | 3 refills | Status: DC
Start: 2023-04-28 — End: 2023-09-17

## 2023-04-28 MED ORDER — PREDNISONE 10 MG (21) PO TBPK: ORAL_TABLET | Freq: Every day | ORAL | 0 refills | Status: DC

## 2023-04-28 MED ORDER — IBUPROFEN 800 MG PO TABS
800.0000 mg | ORAL_TABLET | Freq: Once | ORAL | Status: AC
  Administered 2023-04-28: 800 mg via ORAL

## 2023-04-28 MED ORDER — IBUPROFEN 800 MG PO TABS: 800.0000 mg | ORAL_TABLET | Freq: Three times a day (TID) | ORAL | 0 refills | Status: DC | PRN

## 2023-04-28 NOTE — ED Triage Notes (Addendum)
 Patient to Urgent Care with complaints of right sided knee pain/ redness/ swelling.   Symptoms started two days ago. Hx of gout. Concerned about the same.

## 2023-04-28 NOTE — Discharge Instructions (Addendum)
 Follow up with your primary care provider tomorrow.  Go to the emergency department if you have worsening symptoms.    You were given an injection of a steroid today.  Start the prednisone taper tomorrow.  You were given 800 mg of ibuprofen today.  Start the ibuprofen tomorrow.

## 2023-04-28 NOTE — Telephone Encounter (Signed)
 Copied from CRM [id number]. Topic: Clinical - Red Word Triage >> Apr 28, 2023 10:17 AM Philippa Chester F wrote: [facility] that prompted transfer to Nurse Triage: Extreme Pain in his right knee ( flare up of patient Gout)and he would like a refill of his previous prescription of predniSONE (DELTASONE) 20 MG tablet .   He would like it sent over to :  [id number] [place], [place] - [address] [place] [address] Phone: [phone] Fax: [phone] Hours: Not open 24 hours  Chief Complaint: right knee pain; states gout; called for rx refill Symptoms: see above Frequency: constant Pertinent Negatives: Patient denies fever, cp, sob Disposition: [] ED /[] Urgent Care (no appt availability in office) / [] Appointment(In office/virtual)/ []  [place] [facility]/ [x] Home Care/ [] Refused Recommended Disposition /[] [place] [facility]/ []  Follow-up with PCP Additional Notes: care advice given, denies questions; instructed to go to ER if becomes worse.   Reason for Disposition  Knee pain  Answer Assessment - Initial Assessment Questions 1. LOCATION and RADIATION: "Where is the pain located?"      Right knee pain; states gout 2. QUALITY: "What does the pain feel like?"  (e.g., sharp, dull, aching, burning)     sharp 3. SEVERITY: "How bad is the pain?" "What does it keep you from doing?"   (Scale 1-10; or mild, moderate, severe)   -  MILD (1-3): doesn't interfere with normal activities    -  MODERATE (4-7): interferes with normal activities (e.g., work or school) or awakens from sleep, limping    -  SEVERE (8-10): excruciating pain, unable to do any normal activities, unable to walk     "It's bad" hard to move leg 4. ONSET: "When did the pain start?" "Does it come and go, or is it there all the time?"     Last two days 5. RECURRENT: "Have you had this pain before?" If Yes, ask: "When, and what happened then?"     Yes, has gout 6. SETTING: "Has there been any recent  work, exercise or other activity that involved that part of the body?"      denies 7. AGGRAVATING FACTORS: "What makes the knee pain worse?" (e.g., walking, climbing stairs, running)     movement 8. ASSOCIATED SYMPTOMS: "Is there any swelling or redness of the knee?"     Swelling, feels warm 9. OTHER SYMPTOMS: "Do you have any other symptoms?" (e.g., chest pain, difficulty breathing, fever, calf pain)     denies 10. PREGNANCY: "Is there any chance you are pregnant?" "When was your last menstrual period?"       na  Protocols used: Knee Pain-A-AH

## 2023-04-28 NOTE — ED Provider Notes (Signed)
 Patrick Hawkins    CSN: [id number] Arrival date & time: 04/28/23  1448      History   Chief Complaint Chief Complaint  Patient presents with   Knee Pain    HPI Patrick Hawkins is a 63 y.o. male.  Patient presents with 2-day history of right knee pain, swelling, redness, warmth which he reports is consistent with previous episodes of gout.  No trauma.  No wounds, bruising, numbness, weakness, paresthesias.  Treatment attempted with Tylenol; ibuprofen taken 2 days ago.  His medical history includes recurrent gout.  Patient was seen at this urgent care on 02/24/2023 for gouty arthritis of left great toe; Solu-Medrol injection given and discharged with prescription for prednisone.  The history is provided by the patient and medical records.    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 Authorizing Provider  ibuprofen (ADVIL) 800 MG tablet Take 1 tablet (800 mg total) by mouth every 8 (eight) hours as needed. 04/28/23  Yes Mickie Bail, NP  predniSONE (STERAPRED UNI-PAK 21 TAB) 10 MG (21) TBPK tablet Take by mouth daily. As directed 04/29/23  Yes Mickie Bail, NP  albuterol (VENTOLIN HFA) 108 (90 Base) MCG/ACT inhaler Inhale 2 puffs into the lungs every 6 (six) hours as needed for wheezing or shortness of breath. 08/31/20   Madelyn Flavors A, MD  amLODipine (NORVASC) 5 MG tablet Take 5 mg by mouth daily.    [provider]  atorvastatin (LIPITOR) 10 MG tablet Take 1 tablet (10 mg total) by mouth daily. Patient not taking: Reported on 03/30/2021 01/28/21 07/27/21  Gardenia Phlegm, MD  Empagliflozin-metFORMIN HCl (SYNJARDY) 06-998 MG TABS Take 1 tablet by mouth 2 (two) times daily. 02/25/23   Lovie Macadamia, MD  famotidine (PEPCID) 40 MG tablet Take 1 tablet (40 mg total) by mouth daily as needed for heartburn or indigestion. 01/26/23 01/26/24  Gwenevere Abbot, MD  ferrous sulfate 325 (65 FE) MG EC tablet Take 1 tablet (325 mg total) by mouth every other day. 01/16/23 01/16/24  Wouk, Wilfred Curtis, MD  omeprazole (PRILOSEC) 40 MG capsule Take 1 capsule (40 mg total) by mouth daily. 03/05/23   Masters, Florentina Addison, DO  sildenafil (VIAGRA) 100 MG tablet Take 1 tablet (100 mg total)  by mouth as needed for erectile dysfunction. 01/26/23 04/26/23  Gwenevere Abbot, MD  traZODone (DESYREL) 50 MG tablet Take 1 tablet (50 mg total) by mouth at bedtime. 05/30/19 03/08/20  LampteyBritta Mccreedy, MD    Family History Family History  Problem Relation Age of Onset   Diabetes Mother    Diabetes Father    Diabetes Sister    Diabetes Sister    Diabetes Brother    Parkinson's disease Brother     Social History Social History   Tobacco Use   Smoking status: Former    Types: Cigars    Passive exposure: Past   Smokeless tobacco: Never  Vaping Use   Vaping  status: Never Used  Substance Use Topics   Alcohol use: Yes    Alcohol/week: 18.0 standard drinks of alcohol    Types: 18 Cans of beer per week   Drug use: Never     Allergies   Ace inhibitors, Cat dander, Losartan, Shellfish allergy, and Lisinopril   Review of Systems Review of Systems  Constitutional:  Negative for chills and fever.  Musculoskeletal:  Positive for arthralgias, gait problem and joint swelling.  Skin:  Negative for color change, rash and wound.  Neurological:  Negative for weakness and numbness.     Physical Exam Triage Vital Signs ED Triage Vitals  Encounter Vitals Group     BP 04/28/23 1547 (!) 149/88     Systolic BP Percentile --      Diastolic BP Percentile --      Pulse Rate 04/28/23 1547 90     Resp 04/28/23 1547 18     Temp 04/28/23 1547 98.2 F (36.8 C)     Temp src --      SpO2 04/28/23 1547 98 %     Weight --      Height --      Head Circumference --      Peak Flow --      Pain Score 04/28/23 1540 10     Pain Loc --      Pain Education --      Exclude from Growth Chart --    No data found.  Updated Vital Signs BP (!) 149/88   Pulse 90   Temp 98.2 F (36.8 C)   Resp 18   SpO2 98%   Visual Acuity Right Eye Distance:   Left Eye Distance:   Bilateral Distance:    Right Eye Near:   Left Eye Near:    Bilateral Near:     Physical Exam Constitutional:      General: He is not in acute distress. HENT:     Mouth/Throat:     Mouth: Mucous membranes are moist.  Cardiovascular:     Rate and Rhythm: Normal rate and regular rhythm.  Pulmonary:     Effort: Pulmonary effort is normal. No respiratory distress.  Musculoskeletal:        General: Swelling and tenderness present. No deformity. Normal range of motion.     Comments: Generalized edema and tenderness of right knee.    Skin:    General: Skin is warm and dry.     Capillary Refill: Capillary refill takes less than 2 seconds.     Findings: No bruising, erythema, lesion or  rash.  Neurological:     General: No focal deficit present.     Mental Status: He is alert and oriented to person, place, and time.     Sensory: No sensory deficit.  Motor: No weakness.     Gait: Gait abnormal.     Comments: Limping gait      UC Treatments / Results  Labs (all labs ordered are listed, but only abnormal results are displayed) Labs Reviewed - No data to display  EKG   Radiology No results found.  Procedures Procedures (including critical care time)  Medications Ordered in UC Medications  ibuprofen (ADVIL) tablet 800 mg (800 mg Oral Given 04/28/23 1619)  dexamethasone (DECADRON) injection 10 mg (10 mg Intramuscular Given 04/28/23 1610)    Initial Impression / Assessment and Plan / UC Course  I have reviewed the triage vital signs and the nursing notes.  Pertinent labs & imaging results that were available during my care of the patient were reviewed by me and considered in my medical decision making (see chart for details).    Acute gout of right knee.  Right knee is tender and swollen.  No trauma.  His current symptoms are consistent with previous episodes of gout.  Dexamethasone given here today and instructed patient to start prednisone tomorrow.  Ibuprofen given here and prescription for ibuprofen sent to pharmacy.  Education provided on knee pain and gout.  Instructed patient to follow-up with his PCP tomorrow.  ED precautions given.  He agrees to plan of care.  Final Clinical Impressions(s) / UC Diagnoses   Final diagnoses:  Acute gout of right knee, unspecified cause     Discharge Instructions      Follow up with your primary care provider tomorrow.  Go to the emergency department if you have worsening symptoms.    You were given an injection of a steroid today.  Start the prednisone taper tomorrow.  You were given 800 mg of ibuprofen today.  Start the ibuprofen tomorrow.     ED Prescriptions     Medication Sig Dispense Auth. Provider    predniSONE (STERAPRED UNI-PAK 21 TAB) 10 MG (21) TBPK tablet Take by mouth daily. As directed 21 tablet Mickie Bail, NP   ibuprofen (ADVIL) 800 MG tablet Take 1 tablet (800 mg total) by mouth every 8 (eight) hours as needed. 10 tablet Mickie Bail, NP      I have reviewed the PDMP during this encounter.   Mickie Bail, NP 04/28/23 [phone]

## 2023-06-08 ENCOUNTER — Other Ambulatory Visit: Payer: Self-pay

## 2023-06-08 MED ORDER — IBUPROFEN 800 MG PO TABS: 800.0000 mg | ORAL_TABLET | Freq: Three times a day (TID) | ORAL | 0 refills | Status: DC | PRN

## 2023-07-25 ENCOUNTER — Other Ambulatory Visit: Payer: Self-pay | Admitting: Internal Medicine

## 2023-07-25 DIAGNOSIS — N529 Male erectile dysfunction, unspecified: Secondary | ICD-10-CM

## 2023-07-27 ENCOUNTER — Telehealth: Payer: Self-pay | Admitting: Internal Medicine

## 2023-07-27 DIAGNOSIS — N529 Male erectile dysfunction, unspecified: Secondary | ICD-10-CM

## 2023-07-27 MED ORDER — SILDENAFIL CITRATE 100 MG PO TABS: 100.0000 mg | ORAL_TABLET | ORAL | 2 refills | Status: AC | PRN

## 2023-07-27 NOTE — Telephone Encounter (Signed)
 Medication sent to pharmacy

## 2023-07-27 NOTE — Telephone Encounter (Signed)
 Attn: Jada Ellison, CMA  Pt Patrick Hawkins would like a call back,today. Questioning why his medication: sildenafil  (VIAGRA ) 100 MG tablet  was discontinued.   He states he needs it and it should not be discontinued  Please advise.

## 2023-07-27 NOTE — Telephone Encounter (Signed)
 Copied from CRM [id number]. Topic: Clinical - Medication Refill >> Jul 27, 2023 12:49 PM Carrielelia G wrote: Medication: sildenafil  (VIAGRA ) 100 MG tablet  Has the patient contacted their pharmacy? Yes (Agent: If no, request that the patient contact the pharmacy for the refill. If patient does not wish to contact the pharmacy document the reason why and proceed with request.) (Agent: If yes, when and what did the pharmacy advise?)  This is the patient's preferred pharmacy:   [facility] [address], [place] - [address] [place] [address] Phone: [phone] Fax: [phone]  Is this the correct pharmacy for this prescription? Yes If no, delete pharmacy and type the correct one.   Has the prescription been filled recently? Yes  Is the patient out of the medication? Yes  Has the patient been seen for an appointment in the last year OR does the patient have an upcoming appointment? Yes  Can we respond through MyChart? Yes  Agent: Please be advised that Rx refills may take up to 3 business days. We ask that you follow-up with your pharmacy.

## 2023-07-27 NOTE — Telephone Encounter (Signed)
 Medication discontinued 01/26/23

## 2023-07-27 NOTE — Telephone Encounter (Signed)
 Received a call from [facility] stating that the patient wants to know why the rx for Viagra  has been denied. The rx was denied because the pharmacy sent a request for 50 mg, the dosage has been increased to 100 mg. Refill for Viagra  100mg  has been sent to the pharmacy. Patient is aware.

## 2023-08-26 ENCOUNTER — Other Ambulatory Visit: Payer: Self-pay

## 2023-08-26 DIAGNOSIS — E119 Type 2 diabetes mellitus without complications: Secondary | ICD-10-CM

## 2023-08-26 MED ORDER — SYNJARDY 5-1000 MG PO TABS: 1.0000 | ORAL_TABLET | Freq: Two times a day (BID) | ORAL | 3 refills | Status: DC

## 2023-08-26 NOTE — Telephone Encounter (Signed)
 Medication sent to pharmacy

## 2023-09-03 ENCOUNTER — Telehealth: Payer: Self-pay | Admitting: *Deleted

## 2023-09-03 DIAGNOSIS — E119 Type 2 diabetes mellitus without complications: Secondary | ICD-10-CM

## 2023-09-03 MED ORDER — SYNJARDY 5-1000 MG PO TABS: 1.0000 | ORAL_TABLET | Freq: Two times a day (BID) | ORAL | 3 refills | Status: DC

## 2023-09-03 NOTE — Telephone Encounter (Signed)
 Copied from CRM [id number]. Topic: Clinical - Prescription Issue >> Sep 03, 2023  2:31 PM Mercer PEDLAR wrote: Reason for CRM: Patient would like prescription of Empagliflozin-metFORMIN  HCl (SYNJARDY ) 06-998 MG TABS to be sent to [facility] of [facility].  [facility] [address], [place] - [address] [place] [address] Phone: [phone] Fax: [phone]

## 2023-09-17 ENCOUNTER — Other Ambulatory Visit: Payer: Self-pay

## 2023-09-17 ENCOUNTER — Other Ambulatory Visit (HOSPITAL_COMMUNITY): Payer: Self-pay

## 2023-09-17 ENCOUNTER — Ambulatory Visit (INDEPENDENT_AMBULATORY_CARE_PROVIDER_SITE_OTHER): Payer: Self-pay | Admitting: Student

## 2023-09-17 ENCOUNTER — Telehealth: Payer: Self-pay | Admitting: *Deleted

## 2023-09-17 ENCOUNTER — Ambulatory Visit: Payer: Self-pay

## 2023-09-17 VITALS — BP 153/92 | HR 84 | Temp 98.1°F | Ht 70.0 in | Wt 170.0 lb

## 2023-09-17 DIAGNOSIS — E119 Type 2 diabetes mellitus without complications: Secondary | ICD-10-CM

## 2023-09-17 DIAGNOSIS — Z7985 Long-term (current) use of injectable non-insulin antidiabetic drugs: Secondary | ICD-10-CM

## 2023-09-17 DIAGNOSIS — Z7984 Long term (current) use of oral hypoglycemic drugs: Secondary | ICD-10-CM

## 2023-09-17 DIAGNOSIS — I10 Essential (primary) hypertension: Secondary | ICD-10-CM

## 2023-09-17 LAB — POCT GLYCOSYLATED HEMOGLOBIN (HGB A1C): Hemoglobin A1C: 10.7 % — AB (ref 4.0–5.6)

## 2023-09-17 LAB — GLUCOSE, CAPILLARY: Glucose-Capillary: 370 mg/dL — ABNORMAL HIGH (ref 70–99)

## 2023-09-17 MED ORDER — TRULICITY 0.75 MG/0.5ML ~~LOC~~ SOAJ
0.7500 mg | SUBCUTANEOUS | 0 refills | Status: DC
  Filled 2023-09-17: qty 2, 28d supply, fill #0

## 2023-09-17 MED ORDER — METFORMIN HCL 1000 MG PO TABS
1000.0000 mg | ORAL_TABLET | Freq: Two times a day (BID) | ORAL | 11 refills | Status: DC
  Filled 2023-09-17: qty 60, 30d supply, fill #0

## 2023-09-17 MED ORDER — AMLODIPINE BESYLATE 5 MG PO TABS: 5.0000 mg | ORAL_TABLET | Freq: Every day | ORAL | 3 refills | Status: DC

## 2023-09-17 MED ORDER — AMLODIPINE BESYLATE 5 MG PO TABS
5.0000 mg | ORAL_TABLET | Freq: Every day | ORAL | 3 refills | Status: DC
  Filled 2023-09-17: qty 90, 90d supply, fill #0

## 2023-09-17 MED ORDER — AMLODIPINE BESYLATE 5 MG PO TABS
5.0000 mg | ORAL_TABLET | Freq: Every day | ORAL | 3 refills | Status: AC
  Filled 2023-09-17 – 2023-12-11 (×2): qty 30, 30d supply, fill #0
  Filled 2024-03-02: qty 30, 30d supply, fill #1

## 2023-09-17 NOTE — Telephone Encounter (Signed)
 Patient is scheduled to see Dr.Koomson this morning.

## 2023-09-17 NOTE — Assessment & Plan Note (Addendum)
 Uncontrolled hypertension secondary to medication nonadherence. Previously on amlodipine  5 mg daily but has been off medication for >6 months. Denies headache or chest pain. 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  Plan: - Restart amlodipine  5 mg daily - Check BMP - follow up in 4 weeks.

## 2023-09-17 NOTE — Assessment & Plan Note (Addendum)
 Uncontrolled type 2 diabetes -- A1c increased from 10.0 to 10.7. Previously prescribed metformin -empagliflozin (Synjardy ) but unable to afford, and has been without diabetes medications for approximately 6-8 months. 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  Plan: -Start metformin  1000 mg twice daily - Start Trulicity  0.75 mg/0.5 mL weekly for 4 weeks; will titrate to 1.5 mg if he tolerates the initial dose. - Order UACR - Follow-up in 4 weeks

## 2023-09-17 NOTE — Telephone Encounter (Addendum)
 FYI Only or Action Required?: FYI only for provider.  Patient was last seen in primary care on 02/25/2023 by Gabino Boga, MD.  Called Nurse Triage reporting Hyperglycemia.  Symptoms began several days ago.  Interventions attempted: Nothing.  Symptoms are: gradually worsening.  Triage Disposition: Call PCP Now  Patient/caregiver understands and will follow disposition?: Yes   **Appt scheduled for 8/7 at 10:15am/ patient is asymptomatic**                Copied from CRM #[id number]. Topic: Clinical - Red Word Triage >> Sep 17, 2023  9:05 AM Graeme ORN wrote: Red Word that prompted transfer to Nurse Triage: Elevated sugar - 330/340 this morning - recently taken off metformin  Reason for Disposition  [1] Blood glucose > 300 mg/dL (83.2 mmol/L) AND [7] two or more times in a row  Answer Assessment - Initial Assessment Questions 1. BLOOD GLUCOSE: What is your blood glucose level?      330  2. ONSET: When did you check the blood glucose?     This morning at 8:00 am   3. USUAL RANGE: What is your glucose level usually? (e.g., usual fasting morning value, usual evening value)     Normal range is 180-200    5. TYPE 1 or 2:  Do you know what type of diabetes you have?  (e.g., Type 1, Type 2, Gestational; doesn't know)      Type 2   6. INSULIN : Do you take insulin ? What type of insulin (s) do you use? What is the mode of delivery? (syringe, pen; injection or pump)?      No   7. DIABETES PILLS: Do you take any pills for your diabetes? If Yes, ask: Have you missed taking any pills recently?      Metformin , hasn't taken it for days. States pharmacy wont refill the medication.    8. OTHER SYMPTOMS: Do you have any symptoms? (e.g., fever, frequent urination, difficulty breathing, dizziness, weakness, vomiting)  No   Empagliflozin-metFORMIN  HCl (SYNJARDY ) 06-998 MG costs $700, patient not sure why the medication was changed as he states he did not  discuss it with anyone.  Protocols used: Diabetes - High Blood Sugar-A-AH

## 2023-09-17 NOTE — Progress Notes (Signed)
 CC: Acute visit for hyperglycemia.  HPI:  [name] is a 63 y.o. male living with a history stated below and presents today for acute visit for hyperglycemia..   Patient was last seen in resident [facility] about  8 months.  A1c at that time was 10.  He was prescribed metformin -empagliflozin, which he was unable to afford due to cost.  At home, says his blood sugars have been in the high 300's.  Denies any infectious symptoms, has not been started recently new medicines.  Please see problem based assessment and plan for additional details.  Past Medical History:  Diagnosis Date   Abnormal EKG 03/29/2021   Acid reflux    Asthma    As a child   Chest pain 08/31/2020   Diabetes mellitus without complication (HCC)    Erectile dysfunction 01/28/2021   Foreign body in descending colon 01/14/2023   Gout    High cholesterol    Hypertension    Hypertensive urgency 03/29/2021    Current Outpatient Medications on File Prior to Visit  Medication Sig Dispense Refill   albuterol  (VENTOLIN  HFA) 108 (90 Base) MCG/ACT inhaler Inhale 2 puffs into the lungs every 6 (six) hours as needed for wheezing or shortness of breath. 8 g 0   atorvastatin  (LIPITOR) 10 MG tablet Take 1 tablet (10 mg total) by mouth daily. (Patient not taking: Reported on 03/30/2021) 180 tablet 1   Empagliflozin-metFORMIN  HCl (SYNJARDY ) 06-998 MG TABS Take 1 tablet by mouth 2 (two) times daily. 60 tablet 3   famotidine  (PEPCID ) 40 MG tablet Take 1 tablet (40 mg total) by mouth daily as needed for heartburn or indigestion. 30 tablet 11   ferrous sulfate  325 (65 FE) MG EC tablet Take 1 tablet (325 mg total) by mouth every other day. 60 tablet 1   ibuprofen  (ADVIL ) 800 MG tablet Take 1 tablet (800 mg total) by mouth every 8 (eight) hours as needed. 10 tablet 0   omeprazole  (PRILOSEC) 40 MG capsule Take 1 capsule (40 mg total) by mouth daily. 90 capsule 3   sildenafil  (VIAGRA ) 100 MG tablet Take 1 tablet (100 mg total) by mouth  as needed for erectile dysfunction. 30 tablet 2   [DISCONTINUED] traZODone  (DESYREL ) 50 MG tablet Take 1 tablet (50 mg total) by mouth at bedtime. 30 tablet 1   No current facility-administered medications on file prior to visit.    Review of Systems: ROS negative except for what is noted on the assessment and plan.  Vitals:   09/17/23 1054 09/17/23 1100  BP: (!) 160/91 (!) 153/92  Pulse: 83 84  Temp: 98.1 F (36.7 C)   TempSrc: Oral   SpO2: 96%   Weight: 170 lb (77.1 kg)   Height: 5' 10 (1.778 m)     Physical Exam: Constitutional: Pleasant Cardiovascular: RRR, no murmurs. Pulmonary/Chest: Normal air movement, clear bilateral lungs.   Assessment & Plan:   Patient discussed with Dr. CHARLENA Eastern  Assessment & Plan Type 2 diabetes mellitus without complication, without long-term current use of insulin  (HCC) Uncontrolled type 2 diabetes -- A1c increased from 10.0 to 10.7. Previously prescribed metformin -empagliflozin (Synjardy ) but unable to afford, and has been without diabetes medications for approximately 6-8 months. 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  Plan: -Start metformin  1000 mg twice daily - Start Trulicity  0.75 mg/0.5 mL weekly for 4 weeks; will titrate to 1.5 mg if  he tolerates the initial dose. - Order UACR - Follow-up in 4 weeks  Primary hypertension Uncontrolled hypertension secondary to medication nonadherence. Previously on amlodipine  5 mg daily but has been off medication for >6 months. Denies headache or chest pain. 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  Plan: - Restart amlodipine  5 mg daily - Check BMP - follow up in 4 weeks.   Orders Placed This Encounter  Procedures   Glucose, capillary   Comprehensive metabolic panel with GFR   Microalbumin / Creatinine Urine Ratio   POC Hbg A1C    Missy Sandhoff, MD [facility] Internal Medicine,  PGY-2  Date 09/19/2023 Time 8:05 PM

## 2023-09-17 NOTE — Telephone Encounter (Signed)
 Prescription for the Amlodipine  5 mg 1 daily.  #90 with 3 refills tablets was called to the [facility] .  Patient was called and notified of.  Copied from CRM [id number]. Topic: Clinical - Prescription Issue >> Sep 17, 2023 12:16 PM Carmell SAUNDERS wrote: Reason for CRM: Pt was seen today and says he is missing his Amlodipine  5 mg at the [facility]. The medication says it was phoned in, but the pharmacist says they do not have it. Please resend the rx and pt would like a callback once this is rectified. [phone].

## 2023-09-17 NOTE — Patient Instructions (Addendum)
 It was a pleasure taking care of you today!    For your diabetes, please start metformin  1000 mg twice a day.Take 0.75 mg of Trulicity  weekly injection.  2.  Take amlodipine  5 mg     I have ordered the following labs for you:   Lab Orders         Glucose, capillary         Comprehensive metabolic panel with GFR         POC Hbg A1C       Follow up: 4 weeks   Should you have any questions or concerns please call the internal medicine clinic at [phone].     Missy Sandhoff, MD  [facility]

## 2023-09-18 LAB — COMPREHENSIVE METABOLIC PANEL WITH GFR
ALT: 30 IU/L (ref 0–44)
AST: 34 IU/L (ref 0–40)
Albumin: 4.3 g/dL (ref 3.9–4.9)
Alkaline Phosphatase: 105 IU/L (ref 44–121)
BUN/Creatinine Ratio: 13 (ref 10–24)
BUN: 13 mg/dL (ref 8–27)
Bilirubin Total: 0.4 mg/dL (ref 0.0–1.2)
CO2: 20 mmol/L (ref 20–29)
Calcium: 9.4 mg/dL (ref 8.6–10.2)
Chloride: 95 mmol/L — ABNORMAL LOW (ref 96–106)
Creatinine, Ser: 0.98 mg/dL (ref 0.76–1.27)
Globulin, Total: 2.4 g/dL (ref 1.5–4.5)
Glucose: 318 mg/dL — ABNORMAL HIGH (ref 70–99)
Potassium: 4.1 mmol/L (ref 3.5–5.2)
Sodium: 132 mmol/L — ABNORMAL LOW (ref 134–144)
Total Protein: 6.7 g/dL (ref 6.0–8.5)
eGFR: 86 mL/min/1.73 (ref >59)

## 2023-09-18 LAB — MICROALBUMIN / CREATININE URINE RATIO
Creatinine, Urine: 43.5 mg/dL
Microalb/Creat Ratio: <7 mg/g{creat} (ref 0–29)
Microalbumin, Urine: <3 ug/mL

## 2023-09-19 MED ORDER — TRULICITY 1.5 MG/0.5ML ~~LOC~~ SOAJ
1.5000 mg | SUBCUTANEOUS | 0 refills | Status: DC
  Filled 2023-09-19: qty 2, 28d supply, fill #0

## 2023-09-21 ENCOUNTER — Other Ambulatory Visit: Payer: Self-pay

## 2023-09-21 ENCOUNTER — Ambulatory Visit: Payer: Self-pay | Admitting: Student

## 2023-09-21 NOTE — Progress Notes (Signed)
 Mild hyponatremia secondary to hyperglycemia.  Normal LFTs.  Patient notified.

## 2023-09-25 ENCOUNTER — Other Ambulatory Visit: Payer: Self-pay

## 2023-09-28 ENCOUNTER — Other Ambulatory Visit: Payer: Self-pay

## 2023-10-04 NOTE — Progress Notes (Signed)
 Internal Medicine Clinic Attending  Case discussed with the resident at the time of the visit.  We reviewed the resident's history and exam and pertinent patient test results.  I agree with the assessment, diagnosis, and plan of care documented in the resident's note.

## 2023-11-26 ENCOUNTER — Other Ambulatory Visit: Payer: Self-pay | Admitting: Student

## 2023-11-26 DIAGNOSIS — E119 Type 2 diabetes mellitus without complications: Secondary | ICD-10-CM

## 2023-11-26 NOTE — Telephone Encounter (Signed)
 Copied from CRM [id number]. Topic: Clinical - Medication Refill >> Nov 26, 2023  9:36 AM Alfonso ORN wrote: Medication: metFORMIN  (GLUCOPHAGE ) 1000 MG tablet, Dulaglutide  (TRULICITY ) 1.5 MG/0.5ML SOAJ        Has the patient contacted their pharmacy? No (Agent: If no, request that the patient contact the pharmacy for the refill. If patient does not wish to contact the pharmacy document the reason why and proceed with request.) (Agent: If yes, when and what did the pharmacy advise?)  This is the patient's preferred pharmacy:   [facility] [address] [place] [address] Phone: [phone] Fax: [phone]  Is this the correct pharmacy for this prescription? Yes If no, delete pharmacy and type the correct one.   Has the prescription been filled recently? No  Is the patient out of the medication? Yes , pt is out of metFORMIN  (GLUCOPHAGE ) 1000 MG tablet,  Has the patient been seen for an appointment in the last year OR does the patient have an upcoming appointment? Yes  Can we respond through MyChart? No  Agent: Please be advised that Rx refills may take up to 3 business days. We ask that you follow-up with your pharmacy.

## 2023-11-27 ENCOUNTER — Other Ambulatory Visit: Payer: Self-pay

## 2023-11-27 MED ORDER — TRULICITY 1.5 MG/0.5ML ~~LOC~~ SOAJ
1.5000 mg | SUBCUTANEOUS | 0 refills | Status: DC
  Filled 2023-11-27: qty 2, 28d supply, fill #0

## 2023-11-27 MED ORDER — METFORMIN HCL 1000 MG PO TABS
1000.0000 mg | ORAL_TABLET | Freq: Two times a day (BID) | ORAL | 2 refills | Status: AC
  Filled 2023-11-27 – 2023-12-11 (×2): qty 60, 30d supply, fill #0
  Filled 2024-01-28 – 2024-03-02 (×2): qty 60, 30d supply, fill #1

## 2023-12-07 ENCOUNTER — Other Ambulatory Visit: Payer: Self-pay

## 2023-12-07 ENCOUNTER — Inpatient Hospital Stay
Admission: EM | Admit: 2023-12-07 | Discharge: 2023-12-09 | DRG: 378 | Disposition: A | Discharge status: Home / Self Care | Payer: MEDICAID | Attending: Family Medicine | Admitting: Family Medicine

## 2023-12-07 ENCOUNTER — Encounter: Payer: Self-pay | Admitting: Emergency Medicine

## 2023-12-07 ENCOUNTER — Inpatient Hospital Stay: Payer: Self-pay

## 2023-12-07 DIAGNOSIS — K3189 Other diseases of stomach and duodenum: Secondary | ICD-10-CM | POA: Diagnosis present

## 2023-12-07 DIAGNOSIS — D122 Benign neoplasm of ascending colon: Secondary | ICD-10-CM | POA: Diagnosis present

## 2023-12-07 DIAGNOSIS — I10 Essential (primary) hypertension: Secondary | ICD-10-CM | POA: Diagnosis not present

## 2023-12-07 DIAGNOSIS — D128 Benign neoplasm of rectum: Secondary | ICD-10-CM | POA: Diagnosis not present

## 2023-12-07 DIAGNOSIS — E7800 Pure hypercholesterolemia, unspecified: Secondary | ICD-10-CM | POA: Diagnosis present

## 2023-12-07 DIAGNOSIS — K922 Gastrointestinal hemorrhage, unspecified: Principal | ICD-10-CM | POA: Diagnosis present

## 2023-12-07 DIAGNOSIS — K644 Residual hemorrhoidal skin tags: Secondary | ICD-10-CM | POA: Diagnosis present

## 2023-12-07 DIAGNOSIS — F1010 Alcohol abuse, uncomplicated: Secondary | ICD-10-CM | POA: Diagnosis present

## 2023-12-07 DIAGNOSIS — D62 Acute posthemorrhagic anemia: Secondary | ICD-10-CM | POA: Diagnosis present

## 2023-12-07 DIAGNOSIS — D500 Iron deficiency anemia secondary to blood loss (chronic): Secondary | ICD-10-CM | POA: Diagnosis present

## 2023-12-07 DIAGNOSIS — Z7985 Long-term (current) use of injectable non-insulin antidiabetic drugs: Secondary | ICD-10-CM

## 2023-12-07 DIAGNOSIS — Z91013 Allergy to seafood: Secondary | ICD-10-CM

## 2023-12-07 DIAGNOSIS — K449 Diaphragmatic hernia without obstruction or gangrene: Secondary | ICD-10-CM | POA: Diagnosis present

## 2023-12-07 DIAGNOSIS — E1165 Type 2 diabetes mellitus with hyperglycemia: Secondary | ICD-10-CM | POA: Diagnosis present

## 2023-12-07 DIAGNOSIS — Z87891 Personal history of nicotine dependence: Secondary | ICD-10-CM | POA: Diagnosis not present

## 2023-12-07 DIAGNOSIS — Z888 Allergy status to other drugs, medicaments and biological substances status: Secondary | ICD-10-CM

## 2023-12-07 DIAGNOSIS — W449XXA Unspecified foreign body entering into or through a natural orifice, initial encounter: Secondary | ICD-10-CM | POA: Diagnosis present

## 2023-12-07 DIAGNOSIS — Z23 Encounter for immunization: Secondary | ICD-10-CM

## 2023-12-07 DIAGNOSIS — K5730 Diverticulosis of large intestine without perforation or abscess without bleeding: Secondary | ICD-10-CM | POA: Diagnosis not present

## 2023-12-07 DIAGNOSIS — T184XXA Foreign body in colon, initial encounter: Secondary | ICD-10-CM | POA: Diagnosis present

## 2023-12-07 DIAGNOSIS — K625 Hemorrhage of anus and rectum: Secondary | ICD-10-CM | POA: Diagnosis present

## 2023-12-07 DIAGNOSIS — M109 Gout, unspecified: Secondary | ICD-10-CM | POA: Diagnosis present

## 2023-12-07 DIAGNOSIS — K648 Other hemorrhoids: Secondary | ICD-10-CM | POA: Diagnosis not present

## 2023-12-07 DIAGNOSIS — Z91048 Other nonmedicinal substance allergy status: Secondary | ICD-10-CM

## 2023-12-07 DIAGNOSIS — K621 Rectal polyp: Secondary | ICD-10-CM | POA: Diagnosis present

## 2023-12-07 DIAGNOSIS — K649 Unspecified hemorrhoids: Secondary | ICD-10-CM | POA: Diagnosis not present

## 2023-12-07 DIAGNOSIS — Z91128 Patient's intentional underdosing of medication regimen for other reason: Secondary | ICD-10-CM

## 2023-12-07 DIAGNOSIS — Z833 Family history of diabetes mellitus: Secondary | ICD-10-CM

## 2023-12-07 DIAGNOSIS — K5731 Diverticulosis of large intestine without perforation or abscess with bleeding: Principal | ICD-10-CM | POA: Diagnosis present

## 2023-12-07 DIAGNOSIS — K219 Gastro-esophageal reflux disease without esophagitis: Secondary | ICD-10-CM | POA: Diagnosis present

## 2023-12-07 DIAGNOSIS — K921 Melena: Secondary | ICD-10-CM | POA: Diagnosis not present

## 2023-12-07 DIAGNOSIS — K635 Polyp of colon: Secondary | ICD-10-CM | POA: Diagnosis not present

## 2023-12-07 DIAGNOSIS — Z7984 Long term (current) use of oral hypoglycemic drugs: Secondary | ICD-10-CM

## 2023-12-07 DIAGNOSIS — Z79899 Other long term (current) drug therapy: Secondary | ICD-10-CM

## 2023-12-07 LAB — CBC
HCT: 26.6 % — ABNORMAL LOW (ref 39.0–52.0)
Hemoglobin: 9.2 g/dL — ABNORMAL LOW (ref 13.0–17.0)
MCH: 30.1 pg (ref 26.0–34.0)
MCHC: 34.6 g/dL (ref 30.0–36.0)
MCV: 86.9 fL (ref 80.0–100.0)
Platelets: 235 K/uL (ref 150–400)
RBC: 3.06 MIL/uL — ABNORMAL LOW (ref 4.22–5.81)
RDW: 13.6 % (ref 11.5–15.5)
WBC: 6.4 K/uL (ref 4.0–10.5)
nRBC: 0 % (ref 0.0–0.2)

## 2023-12-07 LAB — IRON AND TIBC
Iron: 86 ug/dL (ref 45–182)
Saturation Ratios: 22 % (ref 17.9–39.5)
TIBC: 384 ug/dL (ref 250–450)
UIBC: 298 ug/dL

## 2023-12-07 LAB — COMPREHENSIVE METABOLIC PANEL WITH GFR
ALT: 20 U/L (ref 0–44)
AST: 28 U/L (ref 15–41)
Albumin: 3.8 g/dL (ref 3.5–5.0)
Alkaline Phosphatase: 58 U/L (ref 38–126)
Anion gap: 10 (ref 5–15)
BUN: 19 mg/dL (ref 8–23)
CO2: 23 mmol/L (ref 22–32)
Calcium: 8.6 mg/dL — ABNORMAL LOW (ref 8.9–10.3)
Chloride: 102 mmol/L (ref 98–111)
Creatinine, Ser: 1.1 mg/dL (ref 0.61–1.24)
GFR, Estimated: >60 mL/min (ref >60)
Glucose, Bld: 181 mg/dL — ABNORMAL HIGH (ref 70–99)
Potassium: 3.8 mmol/L (ref 3.5–5.1)
Sodium: 135 mmol/L (ref 135–145)
Total Bilirubin: 0.6 mg/dL (ref 0.0–1.2)
Total Protein: 6.8 g/dL (ref 6.5–8.1)

## 2023-12-07 LAB — FERRITIN: Ferritin: 33 ng/mL (ref 24–336)

## 2023-12-07 LAB — TYPE AND SCREEN
ABO/RH(D): O POS
Antibody Screen: NEGATIVE

## 2023-12-07 LAB — RETICULOCYTES
Immature Retic Fract: 33.5 % — ABNORMAL HIGH (ref 2.3–15.9)
RBC.: 3.1 MIL/uL — ABNORMAL LOW (ref 4.22–5.81)
Retic Count, Absolute: 102.6 K/uL (ref 19.0–186.0)
Retic Ct Pct: 3.3 % — ABNORMAL HIGH (ref 0.4–3.1)

## 2023-12-07 LAB — GLUCOSE, CAPILLARY
Glucose-Capillary: 146 mg/dL — ABNORMAL HIGH (ref 70–99)
Glucose-Capillary: 179 mg/dL — ABNORMAL HIGH (ref 70–99)
Glucose-Capillary: 164 mg/dL — ABNORMAL HIGH (ref 70–99)

## 2023-12-07 LAB — HEMOGLOBIN AND HEMATOCRIT, BLOOD
HCT: 24.2 % — ABNORMAL LOW (ref 39.0–52.0)
Hemoglobin: 8.1 g/dL — ABNORMAL LOW (ref 13.0–17.0)

## 2023-12-07 MED ORDER — SODIUM CHLORIDE 0.9 % IV SOLN: INTRAVENOUS | Status: AC

## 2023-12-07 MED ORDER — HYDRALAZINE HCL 20 MG/ML IJ SOLN: 5.0000 mg | Freq: Four times a day (QID) | INTRAMUSCULAR | Status: DC | PRN

## 2023-12-07 MED ORDER — ADULT MULTIVITAMIN W/MINERALS CH
1.0000 | ORAL_TABLET | Freq: Every day | ORAL | Status: DC
  Administered 2023-12-07 – 2023-12-09 (×3): 1 via ORAL
  Filled 2023-12-07 (×3): qty 1

## 2023-12-07 MED ORDER — INFLUENZA VIRUS VACC SPLIT PF (FLUZONE) 0.5 ML IM SUSY
0.5000 mL | PREFILLED_SYRINGE | INTRAMUSCULAR | Status: DC
Start: 2023-12-08 — End: 2023-12-09
  Filled 2023-12-07: qty 0.5

## 2023-12-07 MED ORDER — IOHEXOL 300 MG/ML  SOLN
100.0000 mL | Freq: Once | INTRAMUSCULAR | Status: AC | PRN
  Administered 2023-12-07: 100 mL via INTRAVENOUS

## 2023-12-07 MED ORDER — ONDANSETRON HCL 4 MG/2ML IJ SOLN: 4.0000 mg | Freq: Four times a day (QID) | INTRAMUSCULAR | Status: DC | PRN

## 2023-12-07 MED ORDER — THIAMINE MONONITRATE 100 MG PO TABS
100.0000 mg | ORAL_TABLET | Freq: Every day | ORAL | Status: DC
  Administered 2023-12-07 – 2023-12-09 (×2): 100 mg via ORAL
  Filled 2023-12-07 (×3): qty 1

## 2023-12-07 MED ORDER — THIAMINE HCL 100 MG/ML IJ SOLN
100.0000 mg | Freq: Every day | INTRAMUSCULAR | Status: DC
  Administered 2023-12-08: 100 mg via INTRAVENOUS
  Filled 2023-12-07: qty 2

## 2023-12-07 MED ORDER — PANTOPRAZOLE SODIUM 40 MG IV SOLR
80.0000 mg | Freq: Once | INTRAVENOUS | Status: AC
  Administered 2023-12-07: 80 mg via INTRAVENOUS
  Filled 2023-12-07: qty 20

## 2023-12-07 MED ORDER — LORAZEPAM 2 MG/ML IJ SOLN: 1.0000 mg | INTRAMUSCULAR | Status: DC | PRN

## 2023-12-07 MED ORDER — ACETAMINOPHEN 325 MG PO TABS: 650.0000 mg | ORAL_TABLET | Freq: Four times a day (QID) | ORAL | Status: DC | PRN

## 2023-12-07 MED ORDER — ACETAMINOPHEN 650 MG RE SUPP: 650.0000 mg | Freq: Four times a day (QID) | RECTAL | Status: DC | PRN

## 2023-12-07 MED ORDER — PANTOPRAZOLE SODIUM 40 MG PO TBEC
40.0000 mg | DELAYED_RELEASE_TABLET | Freq: Two times a day (BID) | ORAL | Status: DC
  Administered 2023-12-07 – 2023-12-08 (×3): 40 mg via ORAL
  Filled 2023-12-07 (×3): qty 1

## 2023-12-07 MED ORDER — ONDANSETRON HCL 4 MG PO TABS: 4.0000 mg | ORAL_TABLET | Freq: Four times a day (QID) | ORAL | Status: DC | PRN

## 2023-12-07 MED ORDER — LORAZEPAM 1 MG PO TABS: 1.0000 mg | ORAL_TABLET | ORAL | Status: DC | PRN

## 2023-12-07 MED ORDER — FOLIC ACID 1 MG PO TABS
1.0000 mg | ORAL_TABLET | Freq: Every day | ORAL | Status: DC
  Administered 2023-12-07 – 2023-12-09 (×3): 1 mg via ORAL
  Filled 2023-12-07 (×3): qty 1

## 2023-12-07 MED ORDER — INSULIN ASPART 100 UNIT/ML IJ SOLN
0.0000 [IU] | Freq: Three times a day (TID) | INTRAMUSCULAR | Status: DC
  Administered 2023-12-07 – 2023-12-08 (×2): 2 [IU] via SUBCUTANEOUS
  Administered 2023-12-08: 3 [IU] via SUBCUTANEOUS
  Administered 2023-12-08 – 2023-12-09 (×2): 2 [IU] via SUBCUTANEOUS
  Filled 2023-12-07 (×5): qty 1

## 2023-12-07 NOTE — ED Triage Notes (Signed)
 Pt in via POV, reports noticing frank red blood in stools over the last 5 days, states it has since developed into dark red blood w/ a foul odor.  Denies any pain, denies N/V.  Does report hx of diverticulitis.

## 2023-12-07 NOTE — ED Provider Notes (Signed)
 [facility] Provider Note    Event Date/Time   First MD Initiated Contact with Patient 12/07/23 1409     (approximate)   History   Rectal Bleeding   HPI  CORTAVIUS MONTESINOS is a 63 y.o. male history of diabetes, alcohol use, not on blood thinning medication, presenting with hematochezia.  States it been ongoing for 5 days.  No abdominal pain, no nausea or vomiting.  She does have history of diverticulosis, no prior history of diverticulitis.  States that the stool is soft but not diarrhea, states that it started out prior to her and now is becoming more dark maroon.  Denies upper abdominal pain.  States that similar bleeding is happened in the past that was found to be secondary to diverticulosis.  On independent chart review, he was seen by primary care in August, has history of uncontrolled diabetes, uncontrolled hypertension due to medication nonadherence.  Seen by surgery in January for lower GI bleed thought secondary to diverticulosis.  CT at that time showed a foreign body in the sigmoid colon.  Given that he was asymptomatic, no additional interventions were done.     Physical Exam   Triage Vital Signs: ED Triage Vitals  Encounter Vitals Group     BP 12/07/23 1322 (!) 151/85     Girls Systolic BP Percentile --      Girls Diastolic BP Percentile --      Boys Systolic BP Percentile --      Boys Diastolic BP Percentile --      Pulse Rate 12/07/23 1322 92     Resp 12/07/23 1322 17     Temp 12/07/23 1322 98.8 F (37.1 C)     Temp Source 12/07/23 1322 Oral     SpO2 12/07/23 1322 100 %     Weight 12/07/23 1330 175 lb (79.4 kg)     Height 12/07/23 1330 5' 10 (1.778 m)     Head Circumference --      Peak Flow --      Pain Score 12/07/23 1330 0     Pain Loc --      Pain Education --      Exclude from Growth Chart --     Most recent vital signs: Vitals:   12/07/23 1322  BP: (!) 151/85  Pulse: 92  Resp: 17  Temp: 98.8 F (37.1 C)  SpO2: 100%      General: Awake, no distress.  CV:  Good peripheral perfusion.  Resp:  Normal effort.  Abd:  No distention.  Soft nontender Other:  Rectal exam was done as chaperone, he has dark maroon stool in his rectal vault   ED Results / Procedures / Treatments   Labs (all labs ordered are listed, but only abnormal results are displayed) Labs Reviewed  COMPREHENSIVE METABOLIC PANEL WITH GFR - Abnormal; Notable for the following components:      Result Value   Glucose, Bld 181 (*)    Calcium  8.6 (*)    All other components within normal limits  CBC - Abnormal; Notable for the following components:   RBC 3.06 (*)    Hemoglobin 9.2 (*)    HCT 26.6 (*)    All other components within normal limits  POC OCCULT BLOOD, ED  TYPE AND SCREEN       PROCEDURES:  Critical Care performed: No  Procedures   MEDICATIONS ORDERED IN ED: Medications  pantoprazole  (PROTONIX ) injection 80 mg (80 mg Intravenous Given 12/07/23  1427)     IMPRESSION / MDM / ASSESSMENT AND PLAN / ED COURSE  I reviewed the triage vital signs and the nursing notes.                              Differential diagnosis includes, but is not limited to, GI bleed, anemia, peptic ulcer disease, diverticulosis.  Labs.  IV Protonix .  He will need to be admitted for further management.  Will hold off abdominal imaging given that he has no abdominal pain at this time.  Patient's presentation is most consistent with acute presentation with potential threat to life or bodily function.  Independent interpretation of labs below.  Given his GI bleed, he has been admitted for further management.  Consult to hospitalist will admit the patient.  He is admitted.    Clinical Course as of 12/07/23 1435  Mon Dec 07, 2023  1414 Independent review of labs, electrolytes not severely deranged, his hemoglobin is lower than prior lab done 9 months ago. [TT]    Clinical Course User Index [TT] Waymond, Lorelle Cummins, MD     FINAL CLINICAL  IMPRESSION(S) / ED DIAGNOSES   Final diagnoses:  Gastrointestinal hemorrhage, unspecified gastrointestinal hemorrhage type     Rx / DC Orders   ED Discharge Orders     None        Note:  This document was prepared using Dragon voice recognition software and may include unintentional dictation errors.    Waymond Lorelle Cummins, MD 12/07/23 1435

## 2023-12-07 NOTE — H&P (Addendum)
 History and Physical    Patrick Hawkins DOB: 06/24/1960 DOA: 12/07/2023  PCP: Waymond Cart, MD (Confirm with patient/family/NH records and if not entered, this has to be entered at [facility] point of entry) Patient coming from: Home  I have personally briefly reviewed patient's old medical records in [facility]  Chief Complaint: Rectal bleeding  HPI: Patrick Hawkins is a 63 y.o. male with medical history significant of diverticulosis with lower GI bleed x 2, HTN, HLD, IIDM, presented with rectal bleed.  Symptoms started 5 days ago, patient had first episode of bright red blood per rectum, but denied any abdominal pain.  He attributed it to  diverticulosis bleeding comes back.  And in the next 4 days however he continued to pass bloody stool, but the color has turned from bright red to dark red, no blood clots, and the stool has been loose associated with foul smell but still there is no abdominal pain.  Denied any nauseous vomiting either.  Denied any lightheadedness shortness of breath or palpitations. ED Course: Afebrile, nontachycardic blood pressure 151/85, heart rate 92, O2 saturation 99% on room air.  Blood work showed hemoglobin 9.2 compared to baseline 8.0-10.0.  BUN 1421.0 glucose 181 K3.8.  Patient was given Protonix  IV x 1 in the ED.  Review of Systems: As per HPI otherwise 14 point review of systems negative.    Past Medical History:  Diagnosis Date   Abnormal EKG 03/29/2021   Acid reflux    Asthma    As a child   Chest pain 08/31/2020   Diabetes mellitus without complication (HCC)    Erectile dysfunction 01/28/2021   Foreign body in descending colon 01/14/2023   Gout    High cholesterol    Hypertension    Hypertensive urgency 03/29/2021    Past Surgical History:  Procedure Laterality Date   LEFT HEART CATH AND CORONARY ANGIOGRAPHY N/A 08/31/2020   Procedure: LEFT HEART CATH AND CORONARY ANGIOGRAPHY;  Surgeon: Wonda Sharper, MD;  Location: [facility]  INVASIVE CV LAB;  Service: Cardiovascular;  Laterality: N/A;     reports that he has quit smoking. His smoking use included cigars. He has been exposed to tobacco smoke. He has never used smokeless tobacco. He reports current alcohol use of about 18.0 standard drinks of alcohol per week. He reports that he does not use drugs.  Allergies  Allergen Reactions   Ace Inhibitors Swelling    Angioedema 08/31/2020   Cat Dander Shortness Of Breath   Losartan Swelling   Shellfish Allergy Anaphylaxis and Swelling    Other reaction(s): Angioedema   Lisinopril      Family History  Problem Relation Age of Onset   Diabetes Mother    Diabetes Father    Diabetes Sister    Diabetes Sister    Diabetes Brother    Parkinson's disease Brother      Prior to Admission medications   Medication Sig Start Date End Date Taking? Authorizing Provider  ibuprofen  (ADVIL ) 800 MG tablet Take 1 tablet (800 mg total) by mouth every 8 (eight) hours as needed. 06/08/23  Yes Masters, Katie, DO  albuterol  (VENTOLIN  HFA) 108 (90 Base) MCG/ACT inhaler Inhale 2 puffs into the lungs every 6 (six) hours as needed for wheezing or shortness of breath. Patient not taking: Reported on 12/07/2023 08/31/20   Claudene Maximino LABOR, MD  amLODipine  (NORVASC ) 5 MG tablet Take 1 tablet (5 mg total) by mouth daily. Patient not taking: Reported on 12/07/2023 09/17/23 09/16/24  Celestina Czar, MD  atorvastatin  (LIPITOR) 10 MG tablet Take 1 tablet (10 mg total) by mouth daily. Patient not taking: Reported on 03/30/2021 01/28/21 07/27/21  Golda Lynwood PARAS, MD  Dulaglutide  (TRULICITY ) 0.75 MG/0.5ML SOAJ Inject 0.75 mg into the skin once a week. Patient not taking: Reported on 12/07/2023 09/17/23   Celestina Czar, MD  Dulaglutide  (TRULICITY ) 1.5 MG/0.5ML SOAJ Inject 1.5 mg into the skin once a week. Patient not taking: Reported on 12/07/2023 11/27/23   Waymond Cart, MD  Empagliflozin-metFORMIN  HCl (SYNJARDY ) 06-998 MG TABS Take 1 tablet by mouth 2 (two)  times daily. Patient not taking: Reported on 12/07/2023 09/03/23   Waymond Cart, MD  famotidine  (PEPCID ) 40 MG tablet Take 1 tablet (40 mg total) by mouth daily as needed for heartburn or indigestion. Patient not taking: Reported on 12/07/2023 01/26/23 01/26/24  Fernand Prost, MD  ferrous sulfate  325 (65 FE) MG EC tablet Take 1 tablet (325 mg total) by mouth every other day. Patient not taking: Reported on 12/07/2023 01/16/23 01/16/24  Kandis Devaughn Sayres, MD  metFORMIN  (GLUCOPHAGE ) 1000 MG tablet Take 1 tablet (1,000 mg total) by mouth 2 (two) times daily with a meal. Patient not taking: Reported on 12/07/2023 11/27/23   Waymond Cart, MD  omeprazole  (PRILOSEC) 40 MG capsule Take 1 capsule (40 mg total) by mouth daily. Patient not taking: Reported on 12/07/2023 03/05/23   Masters, Izetta, DO  sildenafil  (VIAGRA ) 100 MG tablet Take 1 tablet (100 mg total) by mouth as needed for erectile dysfunction. 07/27/23 10/25/23  Masters, Katie, DO  traZODone  (DESYREL ) 50 MG tablet Take 1 tablet (50 mg total) by mouth at bedtime. 05/30/19 03/08/20  Blaise Aleene KIDD, MD    Physical Exam: Vitals:   12/07/23 1322 12/07/23 1330  BP: (!) 151/85   Pulse: 92   Resp: 17   Temp: 98.8 F (37.1 C)   TempSrc: Oral   SpO2: 100%   Weight:  79.4 kg  Height:  5' 10 (1.778 m)    Constitutional: NAD, calm, comfortable Vitals:   12/07/23 1322 12/07/23 1330  BP: (!) 151/85   Pulse: 92   Resp: 17   Temp: 98.8 F (37.1 C)   TempSrc: Oral   SpO2: 100%   Weight:  79.4 kg  Height:  5' 10 (1.778 m)   Eyes: PERRL, lids and conjunctivae normal ENMT: Mucous membranes are moist. Posterior pharynx clear of any exudate or lesions.Normal dentition.  Neck: normal, supple, no masses, no thyromegaly Respiratory: clear to auscultation bilaterally, no wheezing, no crackles. Normal respiratory effort. No accessory muscle use.  Cardiovascular: Regular rate and rhythm, no murmurs / rubs / gallops. No extremity edema. 2+ pedal pulses.  No carotid bruits.  Abdomen: no tenderness, no masses palpated. No hepatosplenomegaly. Bowel sounds positive.  Musculoskeletal: no clubbing / cyanosis. No joint deformity upper and lower extremities. Good ROM, no contractures. Normal muscle tone.  Skin: no rashes, lesions, ulcers. No induration Neurologic: CN 2-12 grossly intact. Sensation intact, DTR normal. Strength 5/5 in all 4.  Psychiatric: Normal judgment and insight. Alert and oriented x 3. Normal mood.     Labs on Admission: I have personally reviewed following labs and imaging studies  CBC: Recent Labs  Lab 12/07/23 1329  WBC 6.4  HGB 9.2*  HCT 26.6*  MCV 86.9  PLT 235   Basic Metabolic Panel: Recent Labs  Lab 12/07/23 1329  NA 135  K 3.8  CL 102  CO2 23  GLUCOSE 181*  BUN 19  CREATININE 1.10  CALCIUM  8.6*   GFR: Estimated Creatinine Clearance: 70.1 mL/min (by C-G formula based on SCr of 1.1 mg/dL). Liver Function Tests: Recent Labs  Lab 12/07/23 1329  AST 28  ALT 20  ALKPHOS 58  BILITOT 0.6  PROT 6.8  ALBUMIN 3.8   No results for input(s): LIPASE, AMYLASE in the last 168 hours. No results for input(s): AMMONIA in the last 168 hours. Coagulation Profile: No results for input(s): INR, PROTIME in the last 168 hours. Cardiac Enzymes: No results for input(s): CKTOTAL, CKMB, CKMBINDEX, TROPONINI in the last 168 hours. BNP (last 3 results) No results for input(s): PROBNP in the last 8760 hours. HbA1C: No results for input(s): HGBA1C in the last 72 hours. CBG: No results for input(s): GLUCAP in the last 168 hours. Lipid Profile: No results for input(s): CHOL, HDL, LDLCALC, TRIG, CHOLHDL, LDLDIRECT in the last 72 hours. Thyroid  Function Tests: No results for input(s): TSH, T4TOTAL, FREET4, T3FREE, THYROIDAB in the last 72 hours. Anemia Panel: No results for input(s): VITAMINB12, FOLATE, FERRITIN, TIBC, IRON, RETICCTPCT in the last 72  hours. Urine analysis: No results found for: COLORURINE, APPEARANCEUR, LABSPEC, PHURINE, GLUCOSEU, HGBUR, BILIRUBINUR, KETONESUR, PROTEINUR, UROBILINOGEN, NITRITE, LEUKOCYTESUR  Radiological Exams on Admission: No results found.  EKG: Ordered  Assessment/Plan Principal Problem:   Lower GI bleed Active Problems:   Rectal bleeding   Blood loss anemia  (please populate well all problems here in Problem List. (For example, if patient is on BP meds at home and you resume or decide to hold them, it is a problem that needs to be her. Same for CAD, COPD, HLD and so on)  Rectal bleeding and acute blood loss anemia - Continue to trend H&H - Review of patient past record showing that patient was hospitalized for similar rectal bleeding last year, when CT abdomen pelvis showed foreign body in the colon.  Initially this was considered to be a artifact of image study but repeat CT abdominal pelvis in January of this year showed persistent foreign body in the colon.  At that time, patient was referred to see GI for colonoscopy but patient never followed up with GI for the matter, and the colonoscopy was never done. - Etiology likely recurrent calculus of bleeding versus other lower GI bleed etiology.  Upper GI bleed less likely. - GI consulted - N.p.o., IV fluid  IIDM with hyperglycemia - SSI  HTN - Hold off on BP meds - Start as needed hydralazine   Alcohol abuse -No S/S of withdrawal -Start CIWA protocol with PRN benzos  DVT prophylaxis: SCD Code Status: Full code Family Communication: Wife at bedside Disposition Plan: Patient is sick with persistent lower GI bleed, requiring n.p.o., inpatient GI consultation, expect more than 2 midnight hospital stay Consults called: GI Dr. Unk Admission status: Telemetry admission   Cort ONEIDA Mana MD [facility] Pager [phone]  12/07/2023, 3:16 PM

## 2023-12-08 ENCOUNTER — Other Ambulatory Visit: Payer: Self-pay

## 2023-12-08 DIAGNOSIS — K625 Hemorrhage of anus and rectum: Secondary | ICD-10-CM

## 2023-12-08 DIAGNOSIS — E1165 Type 2 diabetes mellitus with hyperglycemia: Secondary | ICD-10-CM | POA: Insufficient documentation

## 2023-12-08 DIAGNOSIS — D500 Iron deficiency anemia secondary to blood loss (chronic): Secondary | ICD-10-CM

## 2023-12-08 DIAGNOSIS — I10 Essential (primary) hypertension: Secondary | ICD-10-CM

## 2023-12-08 LAB — BASIC METABOLIC PANEL WITH GFR
Anion gap: 6 (ref 5–15)
BUN: 14 mg/dL (ref 8–23)
CO2: 25 mmol/L (ref 22–32)
Calcium: 8 mg/dL — ABNORMAL LOW (ref 8.9–10.3)
Chloride: 107 mmol/L (ref 98–111)
Creatinine, Ser: 0.98 mg/dL (ref 0.61–1.24)
GFR, Estimated: >60 mL/min (ref >60)
Glucose, Bld: 198 mg/dL — ABNORMAL HIGH (ref 70–99)
Potassium: 3.7 mmol/L (ref 3.5–5.1)
Sodium: 138 mmol/L (ref 135–145)

## 2023-12-08 LAB — GLUCOSE, CAPILLARY
Glucose-Capillary: 181 mg/dL — ABNORMAL HIGH (ref 70–99)
Glucose-Capillary: 124 mg/dL — ABNORMAL HIGH (ref 70–99)
Glucose-Capillary: 148 mg/dL — ABNORMAL HIGH (ref 70–99)
Glucose-Capillary: 94 mg/dL (ref 70–99)

## 2023-12-08 LAB — CBC
HCT: 23.6 % — ABNORMAL LOW (ref 39.0–52.0)
Hemoglobin: 7.9 g/dL — ABNORMAL LOW (ref 13.0–17.0)
MCH: 28.8 pg (ref 26.0–34.0)
MCHC: 33.5 g/dL (ref 30.0–36.0)
MCV: 86.1 fL (ref 80.0–100.0)
Platelets: 208 K/uL (ref 150–400)
RBC: 2.74 MIL/uL — ABNORMAL LOW (ref 4.22–5.81)
RDW: 13.4 % (ref 11.5–15.5)
WBC: 6 K/uL (ref 4.0–10.5)
nRBC: 0 % (ref 0.0–0.2)

## 2023-12-08 LAB — HEMOGLOBIN: Hemoglobin: 9.1 g/dL — ABNORMAL LOW (ref 13.0–17.0)

## 2023-12-08 MED ORDER — IRON SUCROSE 300 MG IVPB - SIMPLE MED
300.0000 mg | Freq: Once | Status: AC
  Administered 2023-12-08: 300 mg via INTRAVENOUS
  Filled 2023-12-08: qty 300

## 2023-12-08 MED ORDER — PEG 3350-KCL-NA BICARB-NACL 420 G PO SOLR
4000.0000 mL | Freq: Once | ORAL | Status: AC
  Administered 2023-12-08: 4000 mL via ORAL
  Filled 2023-12-08: qty 4000

## 2023-12-08 MED ORDER — SODIUM CHLORIDE 0.9 % IV SOLN: INTRAVENOUS | Status: DC

## 2023-12-08 NOTE — Progress Notes (Signed)
 Pt appears this morning unpleasant and irritated this morning. Stated my concerns concerns are not being address. Pt reassured GI will see him today.

## 2023-12-08 NOTE — Progress Notes (Addendum)
 Patient presented from home with gross bleeding from rectum times one week. GI consulted and rec upper endoscopy and colonoscopy. No TOC needs identified at this time.

## 2023-12-08 NOTE — Plan of Care (Signed)
  Problem: Fluid Volume: Goal: Ability to maintain a balanced intake and output will improve Outcome: Progressing   Problem: Skin Integrity: Goal: Risk for impaired skin integrity will decrease Outcome: Progressing   Problem: Clinical Measurements: Goal: Will remain free from infection Outcome: Progressing   Problem: Safety: Goal: Ability to remain free from injury will improve Outcome: Progressing

## 2023-12-08 NOTE — Plan of Care (Signed)
  Problem: Education: Goal: Ability to describe self-care measures that may prevent or decrease complications (Diabetes Survival Skills Education) will improve Outcome: Progressing   Problem: Coping: Goal: Ability to adjust to condition or change in health will improve Outcome: Progressing   Problem: Nutritional: Goal: Maintenance of adequate nutrition will improve Outcome: Progressing   Problem: Skin Integrity: Goal: Risk for impaired skin integrity will decrease Outcome: Progressing   Problem: Coping: Goal: Level of anxiety will decrease Outcome: Progressing   Problem: Elimination: Goal: Will not experience complications related to bowel motility Outcome: Progressing   Problem: Pain Managment: Goal: General experience of comfort will improve and/or be controlled Outcome: Progressing   Problem: Safety: Goal: Ability to remain free from injury will improve Outcome: Progressing

## 2023-12-08 NOTE — Hospital Course (Signed)
 62 y.o. male with medical history significant of diverticulosis with lower GI bleed x 2, HTN, HLD, IIDM, presented with rectal bleed.   Symptoms started 5 days ago, patient had first episode of bright red blood per rectum, but denied any abdominal pain.  He attributed it to  diverticulosis bleeding comes back.  And in the next 4 days however he continued to pass bloody stool, but the color has turned from bright red to dark red, no blood clots, and the stool has been loose associated with foul smell but still there is no abdominal pain.  Denied any nauseous vomiting either.  Denied any lightheadedness shortness of breath or palpitations. ED Course: Afebrile, nontachycardic blood pressure 151/85, heart rate 92, O2 saturation 99% on room air.  Blood work showed hemoglobin 9.2 compared to baseline 8.0-10.0.  BUN 1421.0 glucose 181 K3.8.   Patient was given Protonix  IV x 1 in the ED.  10/28.  Spoke with gastroenterologist and she was not contacted yesterday.  She will prep for colonoscopy for tomorrow to evaluate for mobility and bleeding.  Will give IV iron today.  Serial hemoglobins and may end up needing transfusion.

## 2023-12-08 NOTE — Assessment & Plan Note (Signed)
 Hemoglobin A1c back in August was 10.7.  Does not look like he is on any medications.  On sliding scale insulin  while n.p.o. and liquid diet and monitor.

## 2023-12-08 NOTE — Assessment & Plan Note (Signed)
 Acute blood loss anemia.  Hemoglobin 9.2 on presentation down to 7.9.  Will give IV iron.  Check another hemoglobin later on today and tomorrow morning and transfuse if needed.  Looking back hemoglobin was 12.5 on 02/25/2023.  Ferritin low at 33 going along with iron deficiency anemia.

## 2023-12-08 NOTE — Progress Notes (Signed)
 Progress Note   Patient: Patrick Hawkins FMW:[id number] DOB: 02/13/1960 DOA: 12/07/2023     1 DOS: the patient was seen and examined on 12/08/2023   Brief hospital course: 63 y.o. male with medical history significant of diverticulosis with lower GI bleed x 2, HTN, HLD, IIDM, presented with rectal bleed.   Symptoms started 5 days ago, patient had first episode of bright red blood per rectum, but denied any abdominal pain.  He attributed it to  diverticulosis bleeding comes back.  And in the next 4 days however he continued to pass bloody stool, but the color has turned from bright red to dark red, no blood clots, and the stool has been loose associated with foul smell but still there is no abdominal pain.  Denied any nauseous vomiting either.  Denied any lightheadedness shortness of breath or palpitations. ED Course: Afebrile, nontachycardic blood pressure 151/85, heart rate 92, O2 saturation 99% on room air.  Blood work showed hemoglobin 9.2 compared to baseline 8.0-10.0.  BUN 1421.0 glucose 181 K3.8.   Patient was given Protonix  IV x 1 in the ED.  10/28.  Spoke with gastroenterologist and she was not contacted yesterday.  She will prep for colonoscopy for tomorrow to evaluate for mobility and bleeding.  Will give IV iron today.  Serial hemoglobins and may end up needing transfusion.  Assessment and Plan: Rectal bleeding Persistent rectal bleeding going on for a week.  History of diverticular bleed in the past.  CT scan with contrast shows unchanged foreign body distal's descending colon with inflammatory change in the pericolonic fat no fluid collection or free air.  Diffuse diverticulosis without diverticulitis.  Wall thickening distal esophagus and small hiatal hernia.  Case discussed with gastroenterology and will prep for colonoscopy to evaluate foreign body and bleeding.  Patient showed me a picture of the bleeding this morning.  Did have some blood around the stool.  Blood loss  anemia Acute blood loss anemia.  Hemoglobin 9.2 on presentation down to 7.9.  Will give IV iron.  Check another hemoglobin later on today and tomorrow morning and transfuse if needed.  Looking back hemoglobin was 12.5 on 02/25/2023.  Ferritin low at 33 going along with iron deficiency anemia.  Essential hypertension Blood pressure stable.  Does not look like he is taking medications as outpatient.  Uncontrolled type 2 diabetes mellitus with hyperglycemia, without long-term current use of insulin  (HCC) Hemoglobin A1c back in August was 10.7.  Does not look like he is on any medications.  On sliding scale insulin  while n.p.o. and liquid diet and monitor.        Subjective: Patient concerned that bleeding has continued for over a week.  CT scan showing foreign body but no active bleeding.  Patient showed me a picture of the bleeding this morning and did have blood around the stool but not a massive amount.  Hemoglobin 7.9.  Will give IV iron and monitor for needs of transfusion  Physical Exam: Vitals:   12/08/23 0012 12/08/23 0423 12/08/23 0805 12/08/23 1201  BP: 120/72 115/62 138/80 138/82  Pulse: 88 89 81 75  Resp: 16 16 18 16   Temp: 98.2 F (36.8 C) 98.3 F (36.8 C) 98.7 F (37.1 C) 98.5 F (36.9 C)  TempSrc:  Oral    SpO2: 97% 99% 99% 100%  Weight:      Height:       Physical Exam HENT:     Head: Normocephalic.     Mouth/Throat:  Pharynx: No oropharyngeal exudate.  Eyes:     General: Lids are normal.     Conjunctiva/sclera: Conjunctivae normal.  Cardiovascular:     Rate and Rhythm: Normal rate and regular rhythm.     Heart sounds: Normal heart sounds, S1 normal and S2 normal.  Pulmonary:     Breath sounds: No decreased breath sounds, wheezing, rhonchi or rales.  Abdominal:     Palpations: Abdomen is soft.     Tenderness: There is no abdominal tenderness.  Musculoskeletal:     Right lower leg: No swelling.     Left lower leg: No swelling.  Skin:    General: Skin  is warm.     Findings: No rash.  Neurological:     Mental Status: He is alert and oriented to person, place, and time.     Data Reviewed: Creatinine 0.98 with a GFR greater than 60, electrolytes normal range, glucose 128, ferritin 33, last hemoglobin 7.9   Disposition: Status is: Inpatient Remains inpatient appropriate because: Patient will have prep for colonoscopy clear liquid diet today and n.p.o. after midnight.  IV iron today.  Monitor for need for transfusion.  Planned Discharge Destination: Home    Time spent: 28 minutes  Author: Charlie Patterson, MD 12/08/2023 12:26 PM  For on call review [email or web address].

## 2023-12-08 NOTE — Consult Note (Signed)
 Patrick JONELLE Brooklyn, MD [address]  [place], [place] [address]  Main: [phone] Fax:  [phone] Pager: [phone]   Consultation  Referring Provider:     No ref. provider found Primary Care Physician:  Waymond Cart, MD Primary Gastroenterologist: Dr. Therisa         Reason for Consultation: Hematochezia, acute blood loss anemia  Date of Admission:  12/07/2023 Date of Consultation:  12/08/2023         HPI:   Patrick Hawkins is a 63 y.o. male with history of diabetes, chronic GERD, hypertension, hyperlipidemia, gout presented with 5 days history of dark red bowel movements.  He has known history of recurrent episodes of lower GI bleed presumably from diverticulosis, incidental finding of foreign body in the sigmoid colon, with an abscess that was detected in 01/2023 treated with antibiotics.  He also has history of large hiatal hernia.  He now presents with 5 days history of dark red bowel movements with formed stools, patient shared pictures with me.  He did not have any bowel movement today.  He was seen by Dr. Jordis as outpatient in January 2025 for possible surgery due to presence of foreign body and surgery was deferred until he underwent colonoscopy.  Patient apparently missed follow-up appointment with GI.  Labs and admission revealed hemoglobin of 9.2, at baseline 12.5 in January 2025, dropped to 7.9 today, normal MCV, has low ferritin Received IV iron Normal platelets, normal BUN/creatinine Repeat CT abdomen pelvis with contrast yesterday revealed unchanged linear radiopaque foreign body in the distal descending colon with surrounding inflammatory change in the pericolonic fat.  No presence of fluid collection or free air.  Diffuse colonic diverticulosis without evidence of diverticulitis  Denies smoking, does drink alcohol few times a week  NSAIDs: Ibuprofen  for history of gout and headaches as needed, at least twice a week  Antiplts/Anticoagulants/Anti thrombotics:  None  GI Procedures: More than 10 years ago  Past Medical History:  Diagnosis Date   Abnormal EKG 03/29/2021   Acid reflux    Asthma    As a child   Chest pain 08/31/2020   Diabetes mellitus without complication (HCC)    Erectile dysfunction 01/28/2021   Foreign body in descending colon 01/14/2023   Gout    High cholesterol    Hypertension    Hypertensive urgency 03/29/2021    Past Surgical History:  Procedure Laterality Date   LEFT HEART CATH AND CORONARY ANGIOGRAPHY N/A 08/31/2020   Procedure: LEFT HEART CATH AND CORONARY ANGIOGRAPHY;  Surgeon: Wonda Sharper, MD;  Location: [facility] INVASIVE CV LAB;  Service: Cardiovascular;  Laterality: N/A;     Current Facility-Administered Medications:    0.9 %  sodium chloride  infusion, , Intravenous, Continuous, Wieting, Richard, MD, Last Rate: 50 mL/hr at 12/08/23 0957, Rate Change at 12/08/23 0957   acetaminophen  (TYLENOL ) tablet 650 mg, 650 mg, Oral, Q6H PRN **OR** acetaminophen  (TYLENOL ) suppository 650 mg, 650 mg, Rectal, Q6H PRN, Laurita Manor T, MD   folic acid  (FOLVITE ) tablet 1 mg, 1 mg, Oral, Daily, Laurita, Ping T, MD, 1 mg at 12/08/23 1001   hydrALAZINE  (APRESOLINE ) injection 5 mg, 5 mg, Intravenous, Q6H PRN, Laurita Manor T, MD   influenza vac split trivalent PF (FLUZONE ) injection 0.5 mL, 0.5 mL, Intramuscular, Tomorrow-1000, Zhang, Manor T, MD   insulin  aspart (novoLOG ) injection 0-15 Units, 0-15 Units, Subcutaneous, TID WC, Zhang, Ping T, MD, 2 Units at 12/08/23 1225   LORazepam  (ATIVAN ) tablet 1-4 mg, 1-4 mg, Oral, Q1H  PRN **OR** LORazepam  (ATIVAN ) injection 1-4 mg, 1-4 mg, Intravenous, Q1H PRN, Laurita Cort DASEN, MD   multivitamin with minerals tablet 1 tablet, 1 tablet, Oral, Daily, Laurita Cort T, MD, 1 tablet at 12/08/23 1001   ondansetron  (ZOFRAN ) tablet 4 mg, 4 mg, Oral, Q6H PRN **OR** ondansetron  (ZOFRAN ) injection 4 mg, 4 mg, Intravenous, Q6H PRN, Zhang, Ping T, MD   pantoprazole  (PROTONIX ) EC tablet 40 mg, 40 mg, Oral, BID AC,  Zhang, Ping T, MD, 40 mg at 12/08/23 9171   thiamine  (VITAMIN B1) tablet 100 mg, 100 mg, Oral, Daily, 100 mg at 12/07/23 1719 **OR** thiamine  (VITAMIN B1) injection 100 mg, 100 mg, Intravenous, Daily, Laurita Cort T, MD, 100 mg at 12/08/23 1001   Family History  Problem Relation Age of Onset   Diabetes Mother    Diabetes Father    Diabetes Sister    Diabetes Sister    Diabetes Brother    Parkinson's disease Brother      Social History   Tobacco Use   Smoking status: Former    Types: Cigars    Passive exposure: Past   Smokeless tobacco: Never  Vaping Use   Vaping status: Never Used  Substance Use Topics   Alcohol use: Yes    Alcohol/week: 18.0 standard drinks of alcohol    Types: 18 Cans of beer per week   Drug use: Never    Allergies as of 12/07/2023 - Review Complete 12/07/2023  Allergen Reaction Noted   Ace inhibitors Swelling 08/31/2020   Cat dander Shortness Of Breath 02/22/2013   Losartan Swelling 09/01/2020   Shellfish allergy Anaphylaxis and Swelling 02/22/2013   Lisinopril   03/30/2021    Review of Systems:    All systems reviewed and negative except where noted in HPI.   Physical Exam:  Vital signs in last 24 hours: Temp:  [97.9 F (36.6 C)-98.7 F (37.1 C)] 98.5 F (36.9 C) (10/28 1201) Pulse Rate:  [75-89] 75 (10/28 1201) Resp:  [16-20] 16 (10/28 1201) BP: (115-166)/(62-90) 138/82 (10/28 1201) SpO2:  [97 %-100 %] 100 % (10/28 1201) Last BM Date : 12/06/23 General:   Alert,  Well-developed, well-nourished, pleasant and cooperative in NAD Eyes:  Sclera clear, no icterus.   Conjunctiva pink. Lungs:  Respirations even and unlabored.  Clear throughout to auscultation.   No wheezes, crackles, or rhonchi. No acute distress. Heart:  Regular rate and rhythm; no murmurs, clicks, rubs, or gallops. Abdomen:  Normal bowel sounds. Soft, non-tender and non-distended without masses, hepatosplenomegaly or hernias noted.  No guarding or rebound tenderness.   Rectal:  Not performed Extremities:  No clubbing or edema.  No cyanosis. Neurologic:  Alert and oriented x3 Skin:  Intact without significant lesions or rashes. No jaundice. Psych:  Alert and cooperative. Normal mood and affect.  LAB RESULTS:    Latest Ref Rng & Units 12/08/2023    3:57 AM 12/07/2023    7:43 PM 12/07/2023    1:29 PM  CBC  WBC 4.0 - 10.5 K/uL 6.0   6.4   Hemoglobin 13.0 - 17.0 g/dL 7.9  8.1  9.2   Hematocrit 39.0 - 52.0 % 23.6  24.2  26.6   Platelets 150 - 400 K/uL 208   235     BMET    Latest Ref Rng & Units 12/08/2023    3:57 AM 12/07/2023    1:29 PM 09/17/2023   11:59 AM  BMP  Glucose 70 - 99 mg/dL 801  818  681   BUN  8 - 23 mg/dL 14  19  13    Creatinine 0.61 - 1.24 mg/dL 9.01  8.89  9.01   BUN/Creat Ratio 10 - 24   13   Sodium 135 - 145 mmol/L 138  135  132   Potassium 3.5 - 5.1 mmol/L 3.7  3.8  4.1   Chloride 98 - 111 mmol/L 107  102  95   CO2 22 - 32 mmol/L 25  23  20    Calcium  8.9 - 10.3 mg/dL 8.0  8.6  9.4     LFT    Latest Ref Rng & Units 12/07/2023    1:29 PM 09/17/2023   11:59 AM 01/13/2023    8:28 PM  Hepatic Function  Total Protein 6.5 - 8.1 g/dL 6.8  6.7  6.4   Albumin 3.5 - 5.0 g/dL 3.8  4.3  3.5   AST 15 - 41 U/L 28  34  16   ALT 0 - 44 U/L 20  30  16    Alk Phosphatase 38 - 126 U/L 58  105  62   Total Bilirubin 0.0 - 1.2 mg/dL 0.6  0.4  0.5      STUDIES: CT ABDOMEN PELVIS W CONTRAST Result Date: 12/07/2023 EXAM: CT ABDOMEN AND PELVIS WITH CONTRAST 12/07/2023 05:48:15 PM TECHNIQUE: CT of the abdomen and pelvis was performed with the administration of 100 mL of iohexol  (OMNIPAQUE ) 300 MG/ML solution. Multiplanar reformatted images are provided for review. Automated exposure control, iterative reconstruction, and/or weight-based adjustment of the mA/kV was utilized to reduce the radiation dose to as low as reasonably achievable. COMPARISON: CT abdomen and pelvis 02/24/2023. CLINICAL HISTORY: Lower GI bleed. FINDINGS: LOWER CHEST: Small hiatal  hernia again seen. There is wall thickening of the distal esophagus. LIVER: The liver is unremarkable. GALLBLADDER AND BILE DUCTS: Gallbladder is unremarkable. No biliary ductal dilatation. SPLEEN: No acute abnormality. PANCREAS: No acute abnormality. ADRENAL GLANDS: No acute abnormality. KIDNEYS, URETERS AND BLADDER: No stones in the kidneys or ureters. No hydronephrosis. No perinephric or periureteral stranding. Urinary bladder is unremarkable. GI AND BOWEL: Stomach demonstrates no acute abnormality. There is diffuse colonic diverticulosis. Linear radiopaque foreign body is again seen in the distal descending colon measuring approximately 3 cm with some surrounding inflammatory change in the pericolonic fat. No fluid collection identified. The appendix is within normal limits. There is no bowel obstruction. PERITONEUM AND RETROPERITONEUM: No ascites. No free air. VASCULATURE: Aorta is normal in caliber. LYMPH NODES: No lymphadenopathy. REPRODUCTIVE ORGANS: No acute abnormality. BONES AND SOFT TISSUES: No acute osseous abnormality. No focal soft tissue abnormality. IMPRESSION: 1. Unchanged Linear radiopaque foreign body in the distal descending colon with surrounding inflammatory change in the pericolonic fat. No fluid collection or free air. 2. Diffuse colonic diverticulosis without evidence of diverticulitis. 3. Wall thickening of the distal esophagus and small hiatal hernia. Consider correlation with symptoms and/or endoscopic evaluation if clinically indicated. Electronically signed by: Greig Pique MD 12/07/2023 06:12 PM EDT RP Workstation: [id number]      Impression / Plan:   Patrick Hawkins is a 63 y.o. male with diabetes, hypertension, gout, hiatal hernia, chronic GERD, iron deficiency anemia, foreign object in the descending colon and recurrent episodes of lower GI bleed, colonic diverticulosis presents with recurrence of hematochezia resulting in worsening of anemia  Hematochezia with worsening of  anemia Given dark/black bowel movements, recommend upper endoscopy along with colonoscopy for further evaluation Possible video capsule endoscopy if endoluminal evaluation is negative Continue IV iron Monitor CBC closely to  maintain hemoglobin above 7 Continue Protonix  40 mg p.o. twice daily Clear liquid diet today Bowel prep ordered N.p.o. effective 5 AM tomorrow Avoid NSAID and alcohol use  I have discussed alternative options, risks & benefits,  which include, but are not limited to, bleeding, infection, perforation,respiratory complication & drug reaction.  The patient agrees with this plan & written consent will be obtained.     Thank you for involving me in the care of this patient.      LOS: 1 day   Patrick Brooklyn, MD  12/08/2023, 2:26 PM    Note: This dictation was prepared with Dragon dictation along with smaller phrase technology. Any transcriptional errors that result from this process are unintentional.

## 2023-12-08 NOTE — Assessment & Plan Note (Addendum)
 Persistent rectal bleeding going on for a week.  History of diverticular bleed in the past.  CT scan with contrast shows unchanged foreign body distal's descending colon with inflammatory change in the pericolonic fat no fluid collection or free air.  Diffuse diverticulosis without diverticulitis.  Wall thickening distal esophagus and small hiatal hernia.  Case discussed with gastroenterology and will prep for colonoscopy to evaluate foreign body and bleeding.  Patient showed me a picture of the bleeding this morning.  Did have some blood around the stool.

## 2023-12-08 NOTE — Assessment & Plan Note (Signed)
 Blood pressure stable.  Does not look like he is taking medications as outpatient.

## 2023-12-09 ENCOUNTER — Inpatient Hospital Stay: Payer: MEDICAID | Admitting: Anesthesiology

## 2023-12-09 ENCOUNTER — Encounter: Payer: Self-pay | Admitting: Internal Medicine

## 2023-12-09 ENCOUNTER — Encounter
Admission: EM | Disposition: A | Discharge status: Home / Self Care | Payer: Self-pay | Source: Home / Self Care | Attending: Internal Medicine

## 2023-12-09 HISTORY — PX: POLYPECTOMY: SHX149

## 2023-12-09 HISTORY — PX: COLONOSCOPY: SHX5424

## 2023-12-09 HISTORY — PX: ESOPHAGOGASTRODUODENOSCOPY: SHX5428

## 2023-12-09 LAB — BASIC METABOLIC PANEL WITH GFR
Anion gap: 10 (ref 5–15)
BUN: 8 mg/dL (ref 8–23)
CO2: 22 mmol/L (ref 22–32)
Calcium: 8.1 mg/dL — ABNORMAL LOW (ref 8.9–10.3)
Chloride: 108 mmol/L (ref 98–111)
Creatinine, Ser: 0.95 mg/dL (ref 0.61–1.24)
GFR, Estimated: >60 mL/min (ref >60)
Glucose, Bld: 164 mg/dL — ABNORMAL HIGH (ref 70–99)
Potassium: 3.8 mmol/L (ref 3.5–5.1)
Sodium: 140 mmol/L (ref 135–145)

## 2023-12-09 LAB — GLUCOSE, CAPILLARY
Glucose-Capillary: 146 mg/dL — ABNORMAL HIGH (ref 70–99)
Glucose-Capillary: 134 mg/dL — ABNORMAL HIGH (ref 70–99)

## 2023-12-09 LAB — CBC
HCT: 24.6 % — ABNORMAL LOW (ref 39.0–52.0)
Hemoglobin: 8.1 g/dL — ABNORMAL LOW (ref 13.0–17.0)
MCH: 29.3 pg (ref 26.0–34.0)
MCHC: 32.9 g/dL (ref 30.0–36.0)
MCV: 89.1 fL (ref 80.0–100.0)
Platelets: 211 K/uL (ref 150–400)
RBC: 2.76 MIL/uL — ABNORMAL LOW (ref 4.22–5.81)
RDW: 13.5 % (ref 11.5–15.5)
WBC: 5.3 K/uL (ref 4.0–10.5)
nRBC: 0 % (ref 0.0–0.2)

## 2023-12-09 SURGERY — EGD (ESOPHAGOGASTRODUODENOSCOPY)
Anesthesia: General

## 2023-12-09 MED ORDER — FERROUS SULFATE 325 (65 FE) MG PO TBEC: 325.0000 mg | DELAYED_RELEASE_TABLET | Freq: Every day | ORAL | 0 refills | Status: AC

## 2023-12-09 MED ORDER — SIMETHICONE 40 MG/0.6ML PO SUSP
ORAL | Status: DC | PRN
  Administered 2023-12-09 (×2): 60 mL

## 2023-12-09 MED ORDER — PROPOFOL 10 MG/ML IV BOLUS
INTRAVENOUS | Status: DC | PRN
  Administered 2023-12-09 (×2): 20 mg via INTRAVENOUS
  Administered 2023-12-09: 40 mg via INTRAVENOUS
  Administered 2023-12-09: 20 mg via INTRAVENOUS
  Administered 2023-12-09: 50 mg via INTRAVENOUS
  Administered 2023-12-09 (×2): 20 mg via INTRAVENOUS
  Administered 2023-12-09: 30 mg via INTRAVENOUS
  Administered 2023-12-09: 20 mg via INTRAVENOUS

## 2023-12-09 MED ORDER — SODIUM CHLORIDE 0.9 % IV SOLN: INTRAVENOUS | Status: DC

## 2023-12-09 MED ORDER — POLYETHYLENE GLYCOL 3350 17 GM/SCOOP PO POWD: 17.0000 g | Freq: Every day | ORAL | 0 refills | Status: AC

## 2023-12-09 MED ORDER — LIDOCAINE HCL (PF) 2 % IJ SOLN
INTRAMUSCULAR | Status: DC | PRN
  Administered 2023-12-09: 40 mg via INTRADERMAL

## 2023-12-09 NOTE — Anesthesia Postprocedure Evaluation (Signed)
 Anesthesia Post Note  Patient: Patrick Hawkins  Procedure(s) Performed: EGD (ESOPHAGOGASTRODUODENOSCOPY) COLONOSCOPY POLYPECTOMY, INTESTINE  Patient location during evaluation: PACU Anesthesia Type: General Level of consciousness: awake and awake and alert Pain management: satisfactory to patient Vital Signs Assessment: post-procedure vital signs reviewed and stable Respiratory status: spontaneous breathing Cardiovascular status: blood pressure returned to baseline and stable Anesthetic complications: no   No notable events documented.   Last Vitals:  Vitals:   12/09/23 1229 12/09/23 1249  BP: 106/66 (!) 127/101  Pulse: 73 80  Resp: 20   Temp: 36.8 C   SpO2: 100% 95%    Last Pain:  Vitals:   12/09/23 1229  TempSrc: Temporal  PainSc:                  VAN STAVEREN,Nayla Dias

## 2023-12-09 NOTE — Discharge Summary (Signed)
 DISCHARGE SUMMARY    Patrick Hawkins:[id number] DOB: Jun 26, 1960 DOA: 12/07/2023  PCP: Patrick Cart, MD  Admit date: 12/07/2023 Discharge date: 12/09/2023   Recommendations for Outpatient Follow-up:  Follow up with PCP in 1-2 weeks for chronic medical conditions Follow-up with gastroenterology to receive biopsy results   Hospital Course: Patrick Hawkins is a 63 year old male with history of diverticulosis with 2 prior diverticular bleeds, hypertension, hyperlipidemia, diabetes, who presented with a rectal bleed for 5 days prior to arrival.  On arrival to the ED hemoglobin was 9.2.  Patient received IV iron.  Gastroenterology was consulted. CT abdomen pelvis revealed colonic diverticulosis and small hiatal hernia.  It also revealed small linear radiopaque foreign body in the distal descending colon which was previously seen as well. 10/29 patient underwent EGD which revealed small hiatal hernia and erythematous mucosa in the antrum.  Colonoscopy revealed severe diverticulosis in the rectosigmoid colon sigmoid colon, transverse colon, ascending colon, without evidence of acute diverticular bleed.  It also revealed multiple polyps which were resected.  Foreign body was not identified, gastroenterology endorses no concerns regarding this area and does not recommend surgical consultation at this time.  Patient has been previously evaluated for this foreign body in January 2025 with no recommendations for surgical intervention at that time either. On evaluation after endoscopy patient was back to his baseline and endorsed feeling ready for discharge home.  He was advised on the importance of avoiding constipation and given ER return precautions.  He will need to follow-up with GI for pathology results.  Chronic conditions: Hypertension Hyperlipidemia Diabetes - Follow closely with PCP. No changes to home meds at this time   Discharge Instructions  Discharge Instructions     Call MD for:   difficulty breathing, headache or visual disturbances   Complete by: As directed    Call MD for:  persistant dizziness or light-headedness   Complete by: As directed    Call MD for:  persistant nausea and vomiting   Complete by: As directed    Call MD for:  severe uncontrolled pain   Complete by: As directed    Call MD for:  temperature >100.4   Complete by: As directed    Diet general   Complete by: As directed    Discharge instructions   Complete by: As directed    Follow up with your primary care physician to discuss chronic medication management   Increase activity slowly   Complete by: As directed       Allergies as of 12/09/2023       Reactions   Ace Inhibitors Swelling   Angioedema 08/31/2020   Cat Dander Shortness Of Breath   Losartan Swelling   Shellfish Allergy Anaphylaxis, Swelling   Other reaction(s): Angioedema   Lisinopril          Medication List     STOP taking these medications    albuterol  108 (90 Base) MCG/ACT inhaler Commonly known as: VENTOLIN  HFA   atorvastatin  10 MG tablet Commonly known as: LIPITOR   famotidine  40 MG tablet Commonly known as: PEPCID    ibuprofen  800 MG tablet Commonly known as: ADVIL    omeprazole  40 MG capsule Commonly known as: PRILOSEC   Synjardy  06-998 MG Tabs Generic drug: Empagliflozin-metFORMIN  HCl       TAKE these medications    amLODipine  5 MG tablet Commonly known as: NORVASC  Take 1 tablet (5 mg total) by mouth daily.   ferrous sulfate  325 (65 FE) MG EC tablet Take 1  tablet (325 mg total) by mouth daily with breakfast. What changed: when to take this   metFORMIN  1000 MG tablet Commonly known as: GLUCOPHAGE  Take 1 tablet (1,000 mg total) by mouth 2 (two) times daily with a meal.   polyethylene glycol powder 17 GM/SCOOP powder Commonly known as: MiraLax Take 17 g by mouth daily. Dissolve 1 capful (17g) in 4-8 ounces of liquid and take by mouth daily.   sildenafil  100 MG tablet Commonly known  as: VIAGRA  Take 1 tablet (100 mg total) by mouth as needed for erectile dysfunction.   Trulicity  0.75 MG/0.5ML Soaj Generic drug: Dulaglutide  Inject 0.75 mg into the skin once a week. What changed: Another medication with the same name was removed. Continue taking this medication, and follow the directions you see here.        Follow-up Information     Patrick Cart, MD Follow up.   Specialty: Internal Medicine Why: hospital follow up Contact information: [address] [place] [address] [phone]                Allergies  Allergen Reactions   Ace Inhibitors Swelling    Angioedema 08/31/2020   Cat Dander Shortness Of Breath   Losartan Swelling   Shellfish Allergy Anaphylaxis and Swelling    Other reaction(s): Angioedema   Lisinopril      Consultations: Treatment Team:  Unk Corinn Skiff, MD   Procedures/Studies: CT ABDOMEN PELVIS W CONTRAST Result Date: 12/07/2023 EXAM: CT ABDOMEN AND PELVIS WITH CONTRAST 12/07/2023 05:48:15 PM TECHNIQUE: CT of the abdomen and pelvis was performed with the administration of 100 mL of iohexol  (OMNIPAQUE ) 300 MG/ML solution. Multiplanar reformatted images are provided for review. Automated exposure control, iterative reconstruction, and/or weight-based adjustment of the mA/kV was utilized to reduce the radiation dose to as low as reasonably achievable. COMPARISON: CT abdomen and pelvis 02/24/2023. CLINICAL HISTORY: Lower GI bleed. FINDINGS: LOWER CHEST: Small hiatal hernia again seen. There is wall thickening of the distal esophagus. LIVER: The liver is unremarkable. GALLBLADDER AND BILE DUCTS: Gallbladder is unremarkable. No biliary ductal dilatation. SPLEEN: No acute abnormality. PANCREAS: No acute abnormality. ADRENAL GLANDS: No acute abnormality. KIDNEYS, URETERS AND BLADDER: No stones in the kidneys or ureters. No hydronephrosis. No perinephric or periureteral stranding. Urinary bladder is unremarkable. GI AND  BOWEL: Stomach demonstrates no acute abnormality. There is diffuse colonic diverticulosis. Linear radiopaque foreign body is again seen in the distal descending colon measuring approximately 3 cm with some surrounding inflammatory change in the pericolonic fat. No fluid collection identified. The appendix is within normal limits. There is no bowel obstruction. PERITONEUM AND RETROPERITONEUM: No ascites. No free air. VASCULATURE: Aorta is normal in caliber. LYMPH NODES: No lymphadenopathy. REPRODUCTIVE ORGANS: No acute abnormality. BONES AND SOFT TISSUES: No acute osseous abnormality. No focal soft tissue abnormality. IMPRESSION: 1. Unchanged Linear radiopaque foreign body in the distal descending colon with surrounding inflammatory change in the pericolonic fat. No fluid collection or free air. 2. Diffuse colonic diverticulosis without evidence of diverticulitis. 3. Wall thickening of the distal esophagus and small hiatal hernia. Consider correlation with symptoms and/or endoscopic evaluation if clinically indicated. Electronically signed by: Greig Pique MD 12/07/2023 06:12 PM EDT RP Workstation: [id number]      Discharge Exam: Vitals:   12/09/23 1229 12/09/23 1249  BP: 106/66 (!) 127/101  Pulse: 73 80  Resp: 20   Temp: 98.2 F (36.8 C)   SpO2: 100% 95%   Vitals:   12/09/23 0822 12/09/23 1037 12/09/23 1229  12/09/23 1249  BP: (!) 153/89 (!) 146/79 106/66 (!) 127/101  Pulse: 85 74 73 80  Resp: 17 18 20    Temp: 98.1 F (36.7 C) (!) 96.5 F (35.8 C) 98.2 F (36.8 C)   TempSrc:  Temporal Temporal   SpO2: 99% 100% 100% 95%  Weight:      Height:        Constitutional:  Normal appearance. Non toxic-appearing.  HENT: Head Normocephalic and atraumatic.  Mucous membranes are moist.  Eyes:  Extraocular intact. Conjunctivae normal.  Cardiovascular: Rate and Rhythm: Normal rate and regular rhythm.  Pulmonary: Non labored, symmetric rise of chest wall.  Skin: warm and dry. not jaundiced.   Neurological: No focal deficit present. alert. Oriented.  Psychiatric: Mood and Affect congruent.    The results of significant diagnostics from this hospitalization (including imaging, microbiology, ancillary and laboratory) are listed below for reference.     Microbiology: No results found for this or any previous visit (from the past 240 hours).   Labs: BNP (last 3 results) No results for input(s): BNP in the last 8760 hours. Basic Metabolic Panel: Recent Labs  Lab 12/07/23 1329 12/08/23 0357 12/09/23 0408  NA 135 138 140  K 3.8 3.7 3.8  CL 102 107 108  CO2 23 25 22   GLUCOSE 181* 198* 164*  BUN 19 14 8   CREATININE 1.10 0.98 0.95  CALCIUM  8.6* 8.0* 8.1*   Liver Function Tests: Recent Labs  Lab 12/07/23 1329  AST 28  ALT 20  ALKPHOS 58  BILITOT 0.6  PROT 6.8  ALBUMIN 3.8   No results for input(s): LIPASE, AMYLASE in the last 168 hours. No results for input(s): AMMONIA in the last 168 hours. CBC: Recent Labs  Lab 12/07/23 1329 12/07/23 1943 12/08/23 0357 12/08/23 1914 12/09/23 0408  WBC 6.4  --  6.0  --  5.3  HGB 9.2* 8.1* 7.9* 9.1* 8.1*  HCT 26.6* 24.2* 23.6*  --  24.6*  MCV 86.9  --  86.1  --  89.1  PLT 235  --  208  --  211   Cardiac Enzymes: No results for input(s): CKTOTAL, CKMB, CKMBINDEX, TROPONINI in the last 168 hours. BNP: Invalid input(s): POCBNP CBG: Recent Labs  Lab 12/08/23 1203 12/08/23 1544 12/08/23 1927 12/09/23 0822 12/09/23 1427  GLUCAP 124* 148* 94 146* 134*   D-Dimer No results for input(s): DDIMER in the last 72 hours. Hgb A1c No results for input(s): HGBA1C in the last 72 hours. Lipid Profile No results for input(s): CHOL, HDL, LDLCALC, TRIG, CHOLHDL, LDLDIRECT in the last 72 hours. Thyroid  function studies No results for input(s): TSH, T4TOTAL, T3FREE, THYROIDAB in the last 72 hours.  Invalid input(s): FREET3 Anemia work up Recent Labs    12/07/23 1329  FERRITIN 33   TIBC 384  IRON 86  RETICCTPCT 3.3*   Urinalysis No results found for: COLORURINE, APPEARANCEUR, LABSPEC, PHURINE, GLUCOSEU, HGBUR, BILIRUBINUR, KETONESUR, PROTEINUR, UROBILINOGEN, NITRITE, LEUKOCYTESUR Sepsis Labs Recent Labs  Lab 12/07/23 1329 12/08/23 0357 12/09/23 0408  WBC 6.4 6.0 5.3   Microbiology No results found for this or any previous visit (from the past 240 hours).   Time coordinating discharge: 32 min   SIGNED: Leoda Smithhart, DO [facility] 12/09/2023, 2:38 PM Pager   If 7PM-7AM, please contact night-coverage

## 2023-12-09 NOTE — Plan of Care (Signed)
  Problem: Health Behavior/Discharge Planning: Goal: Ability to identify and utilize available resources and services will improve Outcome: Progressing   Problem: Metabolic: Goal: Ability to maintain appropriate glucose levels will improve Outcome: Progressing   Problem: Education: Goal: Knowledge of General Education information will improve Description: Including pain rating scale, medication(s)/side effects and non-pharmacologic comfort measures Outcome: Progressing   Problem: Clinical Measurements: Goal: Will remain free from infection Outcome: Progressing   Problem: Safety: Goal: Ability to remain free from injury will improve Outcome: Progressing

## 2023-12-09 NOTE — Progress Notes (Signed)
 Per Dr Marquette Sites, dc tele monitoring

## 2023-12-09 NOTE — Op Note (Signed)
 [facility] Gastroenterology Patient Name: Patrick Hawkins Procedure Date: 12/09/2023 11:52 AM MRN: [id number] Account #: [id number] Date of Birth: 15-Jun-1960 Admit Type: Inpatient Age: 63 Room: [facility] ENDO ROOM 4 Gender: Male Note Status: Finalized Instrument Name: Peds Colonoscope [id number] Procedure:             Colonoscopy Indications:           This is the patient's first colonoscopy, Hematochezia,                         Acute post hemorrhagic anemia Providers:             Corinn Jess Brooklyn MD, MD Referring MD:          Letha Cheadle Medicines:             General Anesthesia Complications:         No immediate complications. Estimated blood loss: None. Procedure:             Pre-Anesthesia Assessment:                        - Prior to the procedure, a History and Physical was                         performed, and patient medications and allergies were                         reviewed. The patient is competent. The risks and                         benefits of the procedure and the sedation options and                         risks were discussed with the patient. All questions                         were answered and informed consent was obtained.                         Patient identification and proposed procedure were                         verified by the physician, the nurse, the                         anesthesiologist, the anesthetist and the technician                         in the pre-procedure area in the procedure room in the                         endoscopy suite. Mental Status Examination: alert and                         oriented. Airway Examination: normal oropharyngeal                         airway and neck mobility. Respiratory Examination:  clear to auscultation. CV Examination: normal.                         Prophylactic Antibiotics: The patient does not require                         prophylactic antibiotics.  Prior Anticoagulants: The                         patient has taken no anticoagulant or antiplatelet                         agents. ASA Grade Assessment: II - A patient with mild                         systemic disease. After reviewing the risks and                         benefits, the patient was deemed in satisfactory                         condition to undergo the procedure. The anesthesia                         plan was to use general anesthesia. Immediately prior                         to administration of medications, the patient was                         re-assessed for adequacy to receive sedatives. The                         heart rate, respiratory rate, oxygen saturations,                         blood pressure, adequacy of pulmonary ventilation, and                         response to care were monitored throughout the                         procedure. The physical status of the patient was                         re-assessed after the procedure.                        After obtaining informed consent, the colonoscope was                         passed under direct vision. Throughout the procedure,                         the patient's blood pressure, pulse, and oxygen                         saturations were monitored continuously. The  Colonoscope was introduced through the anus and                         advanced to the the terminal ileum, with                         identification of the appendiceal orifice and IC                         valve. The colonoscopy was performed without                         difficulty. The patient tolerated the procedure well.                         The quality of the bowel preparation was good. The                         terminal ileum, ileocecal valve, appendiceal orifice,                         and rectum were photographed. Findings:      The perianal and digital rectal examinations were normal.  Pertinent       negatives include normal sphincter tone and no palpable rectal lesions.      The terminal ileum appeared normal.      A diminutive polyp was found in the ascending colon. The polyp was       sessile. The polyp was removed with a jumbo cold forceps. Resection and       retrieval were complete.      Two sessile polyps were found in the rectum and ascending colon. The       polyps were 3 to 5 mm in size. These polyps were removed with a cold       snare. Resection and retrieval were complete.      Multiple medium-mouthed diverticula were found in the recto-sigmoid       colon, sigmoid colon, transverse colon and ascending colon. There was no       evidence of diverticular bleeding.      Foreign object was not identified from luminal aspect      Non-bleeding external and internal hemorrhoids were found during       retroflexion. The hemorrhoids were medium-sized. Impression:            - The examined portion of the ileum was normal.                        - One diminutive polyp in the ascending colon, removed                         with a jumbo cold forceps. Resected and retrieved.                        - Two 3 to 5 mm polyps in the rectum and in the                         ascending colon, removed with a cold snare. Resected  and retrieved.                        - Severe diverticulosis in the recto-sigmoid colon, in                         the sigmoid colon, in the transverse colon and in the                         ascending colon. There was no evidence of diverticular                         bleeding.                        - Non-bleeding external and internal hemorrhoids. Recommendation:        - Return patient to hospital ward for possible                         discharge same day.                        - High fiber diet, cardiac diet and diabetic (ADA)                         diet indefinitely.                        - Continue present  medications.                        - Await pathology results.                        - Repeat colonoscopy in 5 years for surveillance based                         on pathology results. Procedure Code(s):     --- Professional ---                        [phone], Colonoscopy, flexible; with removal of                         tumor(s), polyp(s), or other lesion(s) by snare                         technique                        45380, 59, Colonoscopy, flexible; with biopsy, single                         or multiple Diagnosis Code(s):     --- Professional ---                        K64.8, Other hemorrhoids                        D12.2, Benign neoplasm of ascending colon  D12.8, Benign neoplasm of rectum                        K92.1, Melena (includes Hematochezia)                        D62, Acute posthemorrhagic anemia                        K57.30, Diverticulosis of large intestine without                         perforation or abscess without bleeding CPT copyright 2022 American Medical Association. All rights reserved. The codes documented in this report are preliminary and upon coder review may  be revised to meet current compliance requirements. Dr. Corinn Brooklyn Corinn Jess Brooklyn MD, MD 12/09/2023 12:29:52 PM This report has been signed electronically. Number of Addenda: 0 Note Initiated On: 12/09/2023 11:52 AM Scope Withdrawal Time: 0 hours 10 minutes 3 seconds  Total Procedure Duration: 0 hours 12 minutes 49 seconds  Estimated Blood Loss:  Estimated blood loss: none.      [facility]

## 2023-12-09 NOTE — Op Note (Signed)
 [facility] Gastroenterology Patient Name: Patrick Hawkins Procedure Date: 12/09/2023 11:53 AM MRN: [id number] Account #: [id number] Date of Birth: 1960-09-04 Admit Type: Inpatient Age: 63 Room: [facility] ENDO ROOM 4 Gender: Male Note Status: Finalized Instrument Name: Endoscope [id number] Procedure:             Upper GI endoscopy Indications:           Acute post hemorrhagic anemia, Hematochezia Providers:             Corinn Jess Brooklyn MD, MD Referring MD:          Letha Cheadle Medicines:             General Anesthesia Complications:         No immediate complications. Estimated blood loss: None. Procedure:             Pre-Anesthesia Assessment:                        - Prior to the procedure, a History and Physical was                         performed, and patient medications and allergies were                         reviewed. The patient is competent. The risks and                         benefits of the procedure and the sedation options and                         risks were discussed with the patient. All questions                         were answered and informed consent was obtained.                         Patient identification and proposed procedure were                         verified by the physician, the nurse, the                         anesthesiologist, the anesthetist and the technician                         in the pre-procedure area in the procedure room in the                         endoscopy suite. Mental Status Examination: alert and                         oriented. Airway Examination: normal oropharyngeal                         airway and neck mobility. Respiratory Examination:                         clear to auscultation. CV Examination: normal.  Prophylactic Antibiotics: The patient does not require                         prophylactic antibiotics. Prior Anticoagulants: The                         patient has taken  no anticoagulant or antiplatelet                         agents. ASA Grade Assessment: II - A patient with mild                         systemic disease. After reviewing the risks and                         benefits, the patient was deemed in satisfactory                         condition to undergo the procedure. The anesthesia                         plan was to use general anesthesia. Immediately prior                         to administration of medications, the patient was                         re-assessed for adequacy to receive sedatives. The                         heart rate, respiratory rate, oxygen saturations,                         blood pressure, adequacy of pulmonary ventilation, and                         response to care were monitored throughout the                         procedure. The physical status of the patient was                         re-assessed after the procedure.                        After obtaining informed consent, the endoscope was                         passed under direct vision. Throughout the procedure,                         the patient's blood pressure, pulse, and oxygen                         saturations were monitored continuously. The Endoscope                         was introduced through the mouth, and advanced to the  second part of duodenum. The upper GI endoscopy was                         accomplished without difficulty. The patient tolerated                         the procedure well. Findings:      The duodenal bulb and second portion of the duodenum were normal.      A small hiatal hernia was present.      Striped mildly erythematous mucosa without bleeding was found in the       gastric antrum.      The cardia and gastric fundus were normal on retroflexion.      The gastroesophageal junction and examined esophagus were normal. Impression:            - Normal duodenal bulb and second portion of the                          duodenum.                        - Small hiatal hernia.                        - Erythematous mucosa in the antrum.                        - Normal gastroesophageal junction and esophagus.                        - No specimens collected. Recommendation:        - Proceed with colonoscopy as scheduled                        See colonoscopy report Procedure Code(s):     --- Professional ---                        [phone], Esophagogastroduodenoscopy, flexible,                         transoral; diagnostic, including collection of                         specimen(s) by brushing or washing, when performed                         (separate procedure) Diagnosis Code(s):     --- Professional ---                        K44.9, Diaphragmatic hernia without obstruction or                         gangrene                        K31.89, Other diseases of stomach and duodenum                        D62, Acute posthemorrhagic anemia  K92.1, Melena (includes Hematochezia) CPT copyright 2022 American Medical Association. All rights reserved. The codes documented in this report are preliminary and upon coder review may  be revised to meet current compliance requirements. Dr. Corinn Brooklyn Corinn Jess Brooklyn MD, MD 12/09/2023 12:08:50 PM This report has been signed electronically. Number of Addenda: 0 Note Initiated On: 12/09/2023 11:53 AM Estimated Blood Loss:  Estimated blood loss: none.      [facility]

## 2023-12-09 NOTE — Transfer of Care (Signed)
 Immediate Anesthesia Transfer of Care Note  Patient: Patrick Hawkins  Procedure(s) Performed: EGD (ESOPHAGOGASTRODUODENOSCOPY) COLONOSCOPY POLYPECTOMY, INTESTINE  Patient Location: PACU and Endoscopy Unit  Anesthesia Type:MAC  Level of Consciousness: drowsy  Airway & Oxygen Therapy: Patient Spontanous Breathing and Patient connected to nasal cannula oxygen  Post-op Assessment: Report given to RN and Post -op Vital signs reviewed and stable  Post vital signs: Reviewed and stable  Last Vitals:  Vitals Value Taken Time  BP 106/66 12/09/23 12:30  Temp 36.8 C 12/09/23 12:29  Pulse 72 12/09/23 12:30  Resp 15 12/09/23 12:30  SpO2 100 % 12/09/23 12:30  Vitals shown include unfiled device data.  Last Pain:  Vitals:   12/09/23 1229  TempSrc: Temporal  PainSc:          Complications: No notable events documented.

## 2023-12-09 NOTE — Anesthesia Preprocedure Evaluation (Signed)
 Anesthesia Evaluation  Patient identified by MRN, date of birth, ID band Patient awake    Reviewed: Allergy & Precautions, NPO status , Patient's Chart, lab work & pertinent test results  Airway Mallampati: II  TM Distance: >3 FB Neck ROM: full    Dental  (+) Teeth Intact   Pulmonary neg pulmonary ROS, asthma , Patient abstained from smoking., former smoker   Pulmonary exam normal breath sounds clear to auscultation       Cardiovascular Exercise Tolerance: Good hypertension, Pt. on medications negative cardio ROS Normal cardiovascular exam Rhythm:Regular Rate:Normal     Neuro/Psych negative neurological ROS  negative psych ROS   GI/Hepatic negative GI ROS, Neg liver ROS,GERD  Medicated,,  Endo/Other  negative endocrine ROSdiabetes, Type 2, Oral Hypoglycemic Agents    Renal/GU negative Renal ROS  negative genitourinary   Musculoskeletal   Abdominal   Peds negative pediatric ROS (+)  Hematology negative hematology ROS (+) Blood dyscrasia, anemia   Anesthesia Other Findings Past Medical History: 03/29/2021: Abnormal EKG No date: Acid reflux No date: Asthma     Comment:  As a child 08/31/2020: Chest pain No date: Diabetes mellitus without complication (HCC) 01/28/2021: Erectile dysfunction 01/14/2023: Foreign body in descending colon No date: Gout No date: High cholesterol No date: Hypertension 03/29/2021: Hypertensive urgency  Past Surgical History: 08/31/2020: LEFT HEART CATH AND CORONARY ANGIOGRAPHY; N/A     Comment:  Procedure: LEFT HEART CATH AND CORONARY ANGIOGRAPHY;                Surgeon: Wonda Sharper, MD;  Location: [facility] INVASIVE CV               LAB;  Service: Cardiovascular;  Laterality: N/A;  BMI    Body Mass Index: 25.11 kg/m      Reproductive/Obstetrics negative OB ROS                              Anesthesia Physical Anesthesia Plan  ASA: 2  Anesthesia  Plan: General   Post-op Pain Management:    Induction: Intravenous  PONV Risk Score and Plan: Propofol infusion and TIVA  Airway Management Planned: Natural Airway and Nasal Cannula  Additional Equipment:   Intra-op Plan:   Post-operative Plan:   Informed Consent: I have reviewed the patients History and Physical, chart, labs and discussed the procedure including the risks, benefits and alternatives for the proposed anesthesia with the patient or authorized representative who has indicated his/her understanding and acceptance.     Dental Advisory Given  Plan Discussed with: CRNA  Anesthesia Plan Comments:         Anesthesia Quick Evaluation

## 2023-12-10 ENCOUNTER — Telehealth: Payer: Self-pay

## 2023-12-10 LAB — SURGICAL PATHOLOGY

## 2023-12-10 NOTE — Transitions of Care (Post Inpatient/ED Visit) (Unsigned)
   12/10/2023  Name: Patrick Hawkins MRN: [id number] DOB: [birth year]  Today's TOC FU Call Status: Today's TOC FU Call Status:: Unsuccessful Call (1st Attempt) Unsuccessful Call (1st Attempt) Date: 12/10/23  Attempted to reach the patient regarding the most recent Inpatient/ED visit.  Follow Up Plan: Additional outreach attempts will be made to reach the patient to complete the Transitions of Care (Post Inpatient/ED visit) call.   Signature Julian Lemmings, LPN [facility] Nurse Health Advisor Direct Dial [phone]

## 2023-12-11 ENCOUNTER — Other Ambulatory Visit: Payer: Self-pay | Admitting: Student

## 2023-12-11 ENCOUNTER — Other Ambulatory Visit: Payer: Self-pay

## 2023-12-11 ENCOUNTER — Ambulatory Visit: Payer: Self-pay | Admitting: Gastroenterology

## 2023-12-11 DIAGNOSIS — E119 Type 2 diabetes mellitus without complications: Secondary | ICD-10-CM

## 2023-12-11 MED ORDER — TRULICITY 1.5 MG/0.5ML ~~LOC~~ SOAJ
1.5000 mg | SUBCUTANEOUS | 0 refills | Status: DC
  Filled 2023-12-11: qty 2, 28d supply, fill #0

## 2023-12-11 NOTE — Transitions of Care (Post Inpatient/ED Visit) (Signed)
   12/11/2023  Name: Patrick Hawkins MRN: [id number] DOB: 1960/09/25  Today's TOC FU Call Status: Today's TOC FU Call Status:: Successful TOC FU Call Completed Unsuccessful Call (1st Attempt) Date: 12/10/23 [facility] FU Call Complete Date: 12/11/23 Patient's Name and Date of Birth confirmed.  Transition Care Management Follow-up Telephone Call Date of Discharge: 12/09/23 Discharge Facility: [facility]) Type of Discharge: Inpatient Admission Primary Inpatient Discharge Diagnosis:: GI bleed How have you been since you were released from the hospital?: Better Any questions or concerns?: No  Items Reviewed: Did you receive and understand the discharge instructions provided?: Yes Medications obtained,verified, and reconciled?: Yes (Medications Reviewed) Any new allergies since your discharge?: No Dietary orders reviewed?: Yes Do you have support at home?: Yes People in Home [RPT]: significant other  Medications Reviewed Today: Medications Reviewed Today     Reviewed by Emmitt Pan, LPN (Licensed Practical Nurse) on 12/11/23 at 1130  Med List Status: <None>   Medication Order Taking? Sig Documenting Provider Last Dose Status Informant  amLODipine  (NORVASC ) 5 MG tablet [id number]  Take 1 tablet (5 mg total) by mouth daily.  Patient not taking: Reported on 12/11/2023   Koomson, Julius, MD  Active Self, Pharmacy Records  Dulaglutide  (TRULICITY ) 0.75 MG/0.5ML EMMANUEL [id number]  Inject 0.75 mg into the skin once a week.  Patient not taking: Reported on 12/11/2023   Koomson, Julius, MD  Active Self, Pharmacy Records  ferrous sulfate  325 (65 FE) MG EC tablet [id number] Yes Take 1 tablet (325 mg total) by mouth daily with breakfast. Dezii, Alexandra, DO  Active   metFORMIN  (GLUCOPHAGE ) 1000 MG tablet [id number]  Take 1 tablet (1,000 mg total) by mouth 2 (two) times daily with a meal.  Patient not taking: Reported on 12/11/2023   Waymond Cart, MD  Active Self, Pharmacy  Records  polyethylene glycol powder [facility]) 17 GM/SCOOP powder [id number] Yes Take 17 g by mouth daily. Dissolve 1 capful (17g) in 4-8 ounces of liquid and take by mouth daily. Dezii, Alexandra, DO  Active   sildenafil  (VIAGRA ) 100 MG tablet [id number] Yes Take 1 tablet (100 mg total) by mouth as needed for erectile dysfunction. Masters, Psychiatric Nurse, DO  Active     Discontinued 03/08/20 1328             Home Care and Equipment/Supplies: Were Home Health Services Ordered?: NA Any new equipment or medical supplies ordered?: NA  Functional Questionnaire: Do you need assistance with bathing/showering or dressing?: No Do you need assistance with meal preparation?: No Do you need assistance with eating?: No Do you have difficulty maintaining continence: No Do you need assistance with getting out of bed/getting out of a chair/moving?: No Do you have difficulty managing or taking your medications?: No  Follow up appointments reviewed: PCP Follow-up appointment confirmed?: Yes Date of PCP follow-up appointment?: 12/16/23 Follow-up Provider: [facility] Follow-up appointment confirmed?: No Reason Specialist Follow-Up Not Confirmed: Patient has Specialist Provider Number and will Call for Appointment Do you need transportation to your follow-up appointment?: No Do you understand care options if your condition(s) worsen?: Yes-patient verbalized understanding    SIGNATURE Pan Emmitt, LPN [facility] Nurse Health Advisor Direct Dial [phone]

## 2023-12-14 ENCOUNTER — Other Ambulatory Visit: Payer: Self-pay

## 2023-12-16 ENCOUNTER — Ambulatory Visit: Payer: Self-pay

## 2023-12-16 VITALS — BP 132/79 | HR 76 | Temp 98.1°F | Ht 70.0 in | Wt 167.4 lb

## 2023-12-16 DIAGNOSIS — F1090 Alcohol use, unspecified, uncomplicated: Secondary | ICD-10-CM

## 2023-12-16 DIAGNOSIS — K625 Hemorrhage of anus and rectum: Secondary | ICD-10-CM

## 2023-12-16 DIAGNOSIS — E119 Type 2 diabetes mellitus without complications: Secondary | ICD-10-CM

## 2023-12-16 DIAGNOSIS — Z23 Encounter for immunization: Secondary | ICD-10-CM

## 2023-12-16 DIAGNOSIS — I10 Essential (primary) hypertension: Secondary | ICD-10-CM

## 2023-12-16 LAB — POCT GLYCOSYLATED HEMOGLOBIN (HGB A1C): HbA1c, POC (controlled diabetic range): 6.8 % (ref 0.0–7.0)

## 2023-12-16 LAB — GLUCOSE, CAPILLARY: Glucose-Capillary: 188 mg/dL — ABNORMAL HIGH (ref 70–99)

## 2023-12-16 NOTE — Patient Instructions (Addendum)
 Today we discussed the following medical conditions and plan:   -Take all your medications as prescribed (metformin , Trulicity , amlodipine ) -Continue your iron  -I will let you know the results of the blood count. If its too low, you will need blood transfusion. If its low we will send for urgent referral to Gastroenterology - you received your flu shot today   We look forward to seeing you next time. Please call our clinic at [phone] if you have any questions or concerns. The best time to call is Monday-Friday from 9am-4pm, but there is someone available 24/7. If you need medication refills, please notify your pharmacy one week in advance and they will send us  a request.   Thank you for trusting me with your care. Wishing you the best!   Sallyanne Primas, DO  [facility]

## 2023-12-16 NOTE — Assessment & Plan Note (Addendum)
 Prescribed: Amlodipine  5 mg (patient reported not taking) did not take this AM. Encouraged adherence to medication. Patient was previously not taking due to running out and having issues getting medications because of switching pharmacies   - continue amlodipine  5 mg daily

## 2023-12-16 NOTE — Progress Notes (Signed)
 Internal Medicine Clinic Attending  Case discussed with the resident at the time of the visit.  We reviewed the resident's history and exam and pertinent patient test results.  I agree with the assessment, diagnosis, and plan of care documented in the resident's note.

## 2023-12-16 NOTE — Assessment & Plan Note (Addendum)
 Lab Results  Component Value Date   HGBA1C 6.8 12/16/2023   HGBA1C 10.7 (A) 09/17/2023   HGBA1C 10.6 (H) 01/13/2023       Latest Ref Rng & Units 12/09/2023    4:08 AM 12/08/2023    3:57 AM 12/07/2023    1:29 PM  BMP  Glucose 70 - 99 mg/dL 835  801  818   BUN 8 - 23 mg/dL 8  14  19    Creatinine 0.61 - 1.24 mg/dL 9.04  9.01  8.89   Sodium 135 - 145 mmol/L 140  138  135   Potassium 3.5 - 5.1 mmol/L 3.8  3.7  3.8   Chloride 98 - 111 mmol/L 108  107  102   CO2 22 - 32 mmol/L 22  25  23    Calcium  8.9 - 10.3 mg/dL 8.1  8.0  8.6     Medications: Trulicity  1.5 mg weekly, metformin  1000 mg BID  Timing of Medications: ran out briefly but is taking now (couple of weeks ago, stated he was maybe 30 days without it). Switched pharmacies and there was just a lot of getting lost Blood Sugars at home: Takes BG in AM before eating. Around 150s  Diet and Exercise: no exercise. Eating fruits and vegetables, red meats for iron Diabetic Counseling: not interested right now Last LDL : Last TG elevated 02/2023. Recheck 2 months and consider starting medication  Opthalmology: next due 04/16/2024.  Foot Exam: next due 09/16/2024 Diabetic Kidney Evaluation: Next due 09/16/2024 A1C improved from 10.7 now 6.8 - continue current diabetes management  - lipid panel in 2 months

## 2023-12-16 NOTE — Assessment & Plan Note (Signed)
 Still currently drinking alcohol. Used to drink very other day and has cut down quite a bit. Endorses drinking about once a week. For example, drank 3 beers and 3 shots of vodka on Saturday. Patient stated that he used to drink more due to influence around him. CMP 09/17/2023, AST/ALT WNL  -Encouraged alcohol cessation

## 2023-12-16 NOTE — Progress Notes (Signed)
 CC: Hospital F/u  HPI:  [name] is a 63 y.o. male with pertinent past medical history of blood loss anemia, GERD, Diverticulosis, HTN, T2DM (further medical history stated below) and presents today for hospital f/u. Please see problem based assessment and plan for additional details.  Last clinic appointment: 09/17/2023 for T2DM (metformin  1000 mg BID, Trulicity  0.75 mg weekly, goal to titrate up to 1.5 mg), HTN (amlodipine  5 mg daily)  Follow up Hospitalization  Patient was admitted to [facility] on 12/07/2023 and discharged on 12/09/2023. He was treated for rectal bleed. Treatment for this included EGD and colonoscopy which revealed small hiatal hernia and erythematous mucosa in antrum + diverticulosis in rectosigmoid colon, sigmoid colon, transverse colon, ascending colon without evidence of bleed. Multiple polyps were resected. Foreign body was not identified (small linear radiopaque foreign body in distal descending colon found on CT stable from previous imaging). Recommending surveillance colonoscopy in 5 years.  Telephone follow up was done on 12/10/2023. Biopsy results: Ascending colon and rectal polyp tubular adenoma negative for high grade dysplasia or malignancy.  He reports excellent compliance with treatment. He reports this condition is resolved.  Last Pertinent Labs Documented:     Latest Ref Rng & Units 12/09/2023    4:08 AM 12/08/2023    3:57 AM 12/07/2023    1:29 PM  BMP  Glucose 70 - 99 mg/dL 835  801  818   BUN 8 - 23 mg/dL 8  14  19    Creatinine 0.61 - 1.24 mg/dL 9.04  9.01  8.89   Sodium 135 - 145 mmol/L 140  138  135   Potassium 3.5 - 5.1 mmol/L 3.8  3.7  3.8   Chloride 98 - 111 mmol/L 108  107  102   CO2 22 - 32 mmol/L 22  25  23    Calcium  8.9 - 10.3 mg/dL 8.1  8.0  8.6        Latest Ref Rng & Units 12/09/2023    4:08 AM 12/08/2023    7:14 PM 12/08/2023    3:57 AM  CBC  WBC 4.0 - 10.5 K/uL 5.3   6.0   Hemoglobin 13.0 - 17.0 g/dL 8.1  9.1  7.9    Hematocrit 39.0 - 52.0 % 24.6   23.6   Platelets 150 - 400 K/uL 211   208     Lab Results  Component Value Date   HGBA1C 6.8 12/16/2023   HGBA1C 10.7 (A) 09/17/2023   HGBA1C 10.6 (H) 01/13/2023     Past Medical History:  Diagnosis Date   Abnormal EKG 03/29/2021   Acid reflux    Asthma    As a child   Chest pain 08/31/2020   Diabetes mellitus without complication (HCC)    Erectile dysfunction 01/28/2021   Foreign body in descending colon 01/14/2023   Gout    High cholesterol    Hypertension    Hypertensive urgency 03/29/2021    Current Outpatient Medications on File Prior to Visit  Medication Sig Dispense Refill   amLODipine  (NORVASC ) 5 MG tablet Take 1 tablet (5 mg total) by mouth daily. (Patient not taking: Reported on 12/11/2023) 30 tablet 3   Dulaglutide  (TRULICITY ) 1.5 MG/0.5ML SOAJ Inject 1.5 mg into the skin once a week. 2 mL 0   ferrous sulfate  325 (65 FE) MG EC tablet Take 1 tablet (325 mg total) by mouth daily with breakfast. 90 tablet 0   metFORMIN  (GLUCOPHAGE ) 1000 MG tablet Take 1 tablet (1,000 mg  total) by mouth 2 (two) times daily with a meal. (Patient not taking: Reported on 12/11/2023) 60 tablet 2   polyethylene glycol powder (MIRALAX) 17 GM/SCOOP powder Take 17 g by mouth daily. Dissolve 1 capful (17g) in 4-8 ounces of liquid and take by mouth daily. 1530 g 0   sildenafil  (VIAGRA ) 100 MG tablet Take 1 tablet (100 mg total) by mouth as needed for erectile dysfunction. 30 tablet 2   [DISCONTINUED] traZODone  (DESYREL ) 50 MG tablet Take 1 tablet (50 mg total) by mouth at bedtime. 30 tablet 1   No current facility-administered medications on file prior to visit.    Family History  Problem Relation Age of Onset   Diabetes Mother    Diabetes Father    Diabetes Sister    Diabetes Sister    Diabetes Brother    Parkinson's disease Brother     Social History   Socioeconomic History   Marital status: Single    Spouse name: Not on file   Number of children:  Not on file   Years of education: Not on file   Highest education level: Not on file  Occupational History   Not on file  Tobacco Use   Smoking status: Former    Types: Cigars    Passive exposure: Past   Smokeless tobacco: Never  Vaping Use   Vaping status: Never Used  Substance and Sexual Activity   Alcohol use: Yes    Alcohol/week: 18.0 standard drinks of alcohol    Types: 18 Cans of beer per week   Drug use: Never   Sexual activity: Yes  Other Topics Concern   Not on file  Social History Narrative   Not on file   Social Drivers of Health   Financial Resource Strain: Low Risk  (09/17/2023)   Overall Financial Resource Strain (CARDIA)    Difficulty of Paying Living Expenses: Not hard at all  Food Insecurity: No Food Insecurity (12/07/2023)   Hunger Vital Sign    Worried About Running Out of Food in the Last Year: Never true    Ran Out of Food in the Last Year: Never true  Transportation Needs: No Transportation Needs (12/07/2023)   PRAPARE - Administrator, Civil Service (Medical): No    Lack of Transportation (Non-Medical): No  Physical Activity: Inactive (09/17/2023)   Exercise Vital Sign    Days of Exercise per Week: 0 days    Minutes of Exercise per Session: Not on file  Stress: Stress Concern Present (09/17/2023)   [employer] of Occupational Health - Occupational Stress Questionnaire    Feeling of Stress: To some extent  Social Connections: Moderately Isolated (09/17/2023)   Social Connection and Isolation Panel    Frequency of Communication with Friends and Family: More than three times a week    Frequency of Social Gatherings with Friends and Family: Twice a week    Attends Religious Services: Never    Database Administrator or Organizations: No    Attends Banker Meetings: Never    Marital Status: Married  Catering Manager Violence: Not At Risk (12/07/2023)   Humiliation, Afraid, Rape, and Kick questionnaire    Fear of Current or  Ex-Partner: No    Emotionally Abused: No    Physically Abused: No    Sexually Abused: No    Review of Systems: As stated below  Vitals:   12/16/23 1325 12/16/23 1402  BP: (!) 149/81 132/79  Pulse: 75 76  Temp: 98.1  F (36.7 C)   TempSrc: Oral   SpO2: 100%   Weight: 167 lb 6.4 oz (75.9 kg)   Height: 5' 10 (1.778 m)     Physical Exam: Physical Exam Constitutional:      General: He is not in acute distress.    Appearance: He is not ill-appearing.  Cardiovascular:     Rate and Rhythm: Normal rate and regular rhythm.     Heart sounds: Normal heart sounds. No murmur heard.    No friction rub. No gallop.  Pulmonary:     Effort: Pulmonary effort is normal.     Breath sounds: Normal breath sounds. No stridor. No wheezing, rhonchi or rales.  Abdominal:     General: Bowel sounds are normal.     Palpations: Abdomen is soft.     Tenderness: There is no abdominal tenderness. There is no guarding.  Musculoskeletal:     Right lower leg: No edema.     Left lower leg: No edema.  Skin:    General: Skin is warm and dry.  Neurological:     Mental Status: He is alert.      Assessment & Plan:   Patient seen with Dr. Shawn Assessment & Plan Rectal bleeding Patient recently hospitalized for rectal bleeding.  Has been hospitalized in the past requiring a blood transfusions due to bleeding from diverticula; during that last hospitalization doctor recommended that if he were to see bright red blood per rectum he should go to the ED.  Patient endorsed 5 days of bright red blood per rectum before going to ED.  See HPI above for hospital course.   Denies blood in stool, black stool, abdominal pain, fevers, chills, family history of colon cancer, fatigue, shortness of breath, chest pain, palpitations, weakness.  Endorses occasional episodes of lightheadedness with quick movements/bending down since leaving the hospital.  This could be 2/2 possible symptomatic anemia versus inadequate p.o.  water intake.  Is not currently followed by GI. He has been taking ferrous sulfate  325 mg with breakfast.  Last CBC had decrease in hemoglobin by 1.0 over the course of 1 day (9.1-->8.1) 10/28 - 10/29.  Will order CBC and if hemoglobin <7, instructed patient that he will need to go to ED for blood transfusion.  If hemoglobin between 7.1-7.5, will order stat ambulatory to gastroenterology for further workup of possible source of GI bleed.  During colonoscopy, polyps were snared which could be source of new bleed.  I am encouraged that patient is no longer seeing bright red blood per rectum. -CBC ordered Type 2 diabetes mellitus without complication, without long-term current use of insulin  [facility]) Lab Results  Component Value Date   HGBA1C 6.8 12/16/2023   HGBA1C 10.7 (A) 09/17/2023   HGBA1C 10.6 (H) 01/13/2023       Latest Ref Rng & Units 12/09/2023    4:08 AM 12/08/2023    3:57 AM 12/07/2023    1:29 PM  BMP  Glucose 70 - 99 mg/dL 835  801  818   BUN 8 - 23 mg/dL 8  14  19    Creatinine 0.61 - 1.24 mg/dL 9.04  9.01  8.89   Sodium 135 - 145 mmol/L 140  138  135   Potassium 3.5 - 5.1 mmol/L 3.8  3.7  3.8   Chloride 98 - 111 mmol/L 108  107  102   CO2 22 - 32 mmol/L 22  25  23    Calcium  8.9 - 10.3 mg/dL 8.1  8.0  8.6     Medications: Trulicity  1.5 mg weekly, metformin  1000 mg BID  Timing of Medications: ran out briefly but is taking now (couple of weeks ago, stated he was maybe 30 days without it). Switched pharmacies and there was just a lot of getting lost Blood Sugars at home: Takes BG in AM before eating. Around 150s  Diet and Exercise: no exercise. Eating fruits and vegetables, red meats for iron Diabetic Counseling: not interested right now Last LDL : Last TG elevated 02/2023. Recheck 2 months and consider starting medication  Opthalmology: next due 04/16/2024.  Foot Exam: next due 09/16/2024 Diabetic Kidney Evaluation: Next due 09/16/2024 A1C improved from 10.7 now 6.8 - continue  current diabetes management  - lipid panel in 2 months  Essential hypertension Prescribed: Amlodipine  5 mg (patient reported not taking) did not take this AM. Encouraged adherence to medication. Patient was previously not taking due to running out and having issues getting medications because of switching pharmacies   - continue amlodipine  5 mg daily  Alcohol use Still currently drinking alcohol. Used to drink very other day and has cut down quite a bit. Endorses drinking about once a week. For example, drank 3 beers and 3 shots of vodka on Saturday. Patient stated that he used to drink more due to influence around him. CMP 09/17/2023, AST/ALT WNL  -Encouraged alcohol cessation Encounter for immunization - Received flu vaccine   Orders Placed This Encounter  Procedures   Flu vaccine trivalent PF, 6mos and older(Flulaval,Afluria,Fluarix,Fluzone )   Glucose, capillary   CBC no Diff   POC Hbg A1C     Jaleah Lefevre, D.O. [facility], PGY-1 Date 12/16/2023 Time 5:20 PM

## 2023-12-16 NOTE — Assessment & Plan Note (Addendum)
 Patient recently hospitalized for rectal bleeding.  Has been hospitalized in the past requiring a blood transfusions due to bleeding from diverticula; during that last hospitalization doctor recommended that if he were to see bright red blood per rectum he should go to the ED.  Patient endorsed 5 days of bright red blood per rectum before going to ED.  See HPI above for hospital course.   Denies blood in stool, black stool, abdominal pain, fevers, chills, family history of colon cancer, fatigue, shortness of breath, chest pain, palpitations, weakness.  Endorses occasional episodes of lightheadedness with quick movements/bending down since leaving the hospital.  This could be 2/2 possible symptomatic anemia versus inadequate p.o. water intake.  Is not currently followed by GI. He has been taking ferrous sulfate  325 mg with breakfast.  Last CBC had decrease in hemoglobin by 1.0 over the course of 1 day (9.1-->8.1) 10/28 - 10/29.  Will order CBC and if hemoglobin <7, instructed patient that he will need to go to ED for blood transfusion.  If hemoglobin between 7.1-7.5, will order stat ambulatory to gastroenterology for further workup of possible source of GI bleed.  During colonoscopy, polyps were snared which could be source of new bleed.  I am encouraged that patient is no longer seeing bright red blood per rectum. -CBC ordered

## 2023-12-17 ENCOUNTER — Ambulatory Visit: Payer: Self-pay

## 2023-12-17 LAB — CBC
Hematocrit: 30 % — ABNORMAL LOW (ref 37.5–51.0)
Hemoglobin: 9.7 g/dL — ABNORMAL LOW (ref 13.0–17.7)
MCH: 29.3 pg (ref 26.6–33.0)
MCHC: 32.3 g/dL (ref 31.5–35.7)
MCV: 91 fL (ref 79–97)
Platelets: 353 x10E3/uL (ref 150–450)
RBC: 3.31 x10E6/uL — ABNORMAL LOW (ref 4.14–5.80)
RDW: 14 % (ref 11.6–15.4)
WBC: 7.3 x10E3/uL (ref 3.4–10.8)

## 2023-12-23 ENCOUNTER — Other Ambulatory Visit: Payer: Self-pay

## 2024-01-26 ENCOUNTER — Encounter: Payer: Self-pay | Admitting: Emergency Medicine

## 2024-01-26 ENCOUNTER — Ambulatory Visit
Admission: EM | Admit: 2024-01-26 | Discharge: 2024-01-26 | Disposition: A | Discharge status: Home / Self Care | Source: Home / Self Care

## 2024-01-26 DIAGNOSIS — R143 Flatulence: Secondary | ICD-10-CM | POA: Diagnosis not present

## 2024-01-26 DIAGNOSIS — R140 Abdominal distension (gaseous): Secondary | ICD-10-CM

## 2024-01-26 MED ORDER — ALUM & MAG HYDROXIDE-SIMETH 200-200-20 MG/5ML PO SUSP
30.0000 mL | Freq: Once | ORAL | Status: AC
  Administered 2024-01-26: 14:00:00 30 mL via ORAL

## 2024-01-26 NOTE — ED Provider Notes (Signed)
 [facility]    CSN: [id number] Arrival date & time: 01/26/24  1145      History   Chief Complaint Chief Complaint  Patient presents with   GI Problem    HPI IAIN SAWCHUK is a 63 y.o. male.   Patient presents for evaluation of persisting and excessive gas and abdominal bloating present for 1 month.  Associated nausea without spontaneous vomiting endorses symptoms began after taking iron  tablet, discontinued after 1 week but symptoms continued.  Was experienced diverticulitis and a GI bleed that occurred on 12/07/2023, colonoscopy done at that time as well.  Symptoms exacerbated by consumption of food.  At times will feel as if he will need to have a bowel movement but only gas expels.  Last bowel movement 2 days ago, described as Bristol scale 5, yellow in color.  Denies abdominal pain.  Denies presence of mucus or blood in stool. Improves after induce vomiting, last occurrence 1 day ago.  Did not notify physician of discontinuation of medication.  Past Medical History:  Diagnosis Date   Abnormal EKG 03/29/2021   Acid reflux    Asthma    As a child   Chest pain 08/31/2020   Diabetes mellitus without complication (HCC)    Erectile dysfunction 01/28/2021   Foreign body in descending colon 01/14/2023   Gout    High cholesterol    Hypertension    Hypertensive urgency 03/29/2021    Patient Active Problem List   Diagnosis Date Noted   Uncontrolled type 2 diabetes mellitus with hyperglycemia, without long-term current use of insulin  (HCC) 12/08/2023   Lower GI bleed 12/07/2023   GERD (gastroesophageal reflux disease) 01/26/2023   Blood loss anemia 01/14/2023   Rectal bleeding 01/13/2023   Alcohol use 08/19/2022   Adjustment disorder 08/19/2022   Unintentional weight loss 08/19/2022   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   Chronic night sweats 03/01/2021   Erectile dysfunction 01/28/2021   Type 2 diabetes mellitus (HCC) 01/28/2021   Essential  hypertension 01/28/2021   Hyperlipidemia 01/28/2021   Gout 09/26/2013    Past Surgical History:  Procedure Laterality Date   COLONOSCOPY N/A 12/09/2023   Procedure: COLONOSCOPY;  Surgeon: Unk Corinn Skiff, MD;  Location[facility] ENDOSCOPY;  Service: Gastroenterology;  Laterality: N/A;   ESOPHAGOGASTRODUODENOSCOPY N/A 12/09/2023   Procedure: EGD (ESOPHAGOGASTRODUODENOSCOPY);  Surgeon: Unk Corinn Skiff, MD;  Location: [facility] ENDOSCOPY;  Service: Gastroenterology;  Laterality: N/A;   LEFT HEART CATH AND CORONARY ANGIOGRAPHY N/A 08/31/2020   Procedure: LEFT HEART CATH AND CORONARY ANGIOGRAPHY;  Surgeon: Wonda Sharper, MD;  Location: [facility] INVASIVE CV LAB;  Service: Cardiovascular;  Laterality: N/A;   POLYPECTOMY  12/09/2023   Procedure: POLYPECTOMY, INTESTINE;  Surgeon: Unk Corinn Skiff, MD;  Location: [facility] ENDOSCOPY;  Service: Gastroenterology;;       Home Medications    Prior to Admission medications  Medication Sig Start Date End Date Taking? Authorizing Provider  ferrous sulfate  325 (65 FE) MG EC tablet Take 1 tablet (325 mg total) by mouth daily with breakfast. 12/09/23 03/08/24 Yes Dezii, Alexandra, DO  amLODipine  (NORVASC ) 5 MG tablet Take 1 tablet (5 mg total) by mouth daily. Patient not taking: Reported on 12/11/2023 09/17/23 09/16/24  Celestina Czar, MD  Dulaglutide  (TRULICITY ) 1.5 MG/0.5ML SOAJ Inject 1.5 mg into the skin once a week. 12/11/23   Waymond Cart, MD  metFORMIN  (GLUCOPHAGE ) 1000 MG tablet Take 1 tablet (1,000 mg total) by mouth 2 (two) times daily with a meal. Patient not taking:  Reported on 12/11/2023 11/27/23   Waymond Cart, MD  polyethylene glycol powder (MIRALAX ) 17 GM/SCOOP powder Take 17 g by mouth daily. Dissolve 1 capful (17g) in 4-8 ounces of liquid and take by mouth daily. 12/09/23 03/08/24  Dezii, Alexandra, DO  sildenafil  (VIAGRA ) 100 MG tablet Take 1 tablet (100 mg total) by mouth as needed for erectile dysfunction. 07/27/23 12/11/23  Masters, Katie, DO   traZODone  (DESYREL ) 50 MG tablet Take 1 tablet (50 mg total) by mouth at bedtime. 05/30/19 03/08/20  LampteyAleene KIDD, MD    Family History Family History  Problem Relation Age of Onset   Diabetes Mother    Diabetes Father    Diabetes Sister    Diabetes Sister    Diabetes Brother    Parkinson's disease Brother     Social History Social History[1]   Allergies   Ace inhibitors, Cat dander, Losartan, Shellfish allergy, and Lisinopril    Review of Systems Review of Systems   Physical Exam Triage Vital Signs ED Triage Vitals  Encounter Vitals Group     BP 01/26/24 1315 (!) 152/79     Girls Systolic BP Percentile --      Girls Diastolic BP Percentile --      Boys Systolic BP Percentile --      Boys Diastolic BP Percentile --      Pulse Rate 01/26/24 1315 84     Resp 01/26/24 1315 20     Temp 01/26/24 1315 98.7 F (37.1 C)     Temp Source 01/26/24 1315 Oral     SpO2 01/26/24 1315 98 %     Weight --      Height --      Head Circumference --      Peak Flow --      Pain Score 01/26/24 1318 0     Pain Loc --      Pain Education --      Exclude from Growth Chart --    No data found.  Updated Vital Signs BP (!) 152/79 (BP Location: Left Arm)   Pulse 84   Temp 98.7 F (37.1 C) (Oral)   Resp 20   SpO2 98%   Visual Acuity Right Eye Distance:   Left Eye Distance:   Bilateral Distance:    Right Eye Near:   Left Eye Near:    Bilateral Near:     Physical Exam Constitutional:      Appearance: Normal appearance.  Eyes:     Extraocular Movements: Extraocular movements intact.  Pulmonary:     Effort: Pulmonary effort is normal.  Abdominal:     General: Bowel sounds are increased. There is distension.     Tenderness: There is no abdominal tenderness.  Neurological:     Mental Status: He is alert and oriented to person, place, and time.      UC Treatments / Results  Labs (all labs ordered are listed, but only abnormal results are displayed) Labs Reviewed  - No data to display  EKG   Radiology No results found.  Procedures Procedures (including critical care time)  Medications Ordered in UC Medications - No data to display  Initial Impression / Assessment and Plan / UC Course  I have reviewed the triage vital signs and the nursing notes.  Pertinent labs & imaging results that were available during my care of the patient were reviewed by me and considered in my medical decision making (see chart for details).  Excessive gas, abdominal bloating  But is a stable, patient in no signs of distress or toxic appearing, increased bowel sounds throughout the abdominal cavity as well as distention but no abdominal pain noted, stable for outpatient management most likely due to prior GI irritation and possible medication, attempted Maalox without relief, recommended Prilosec for home with a bland diet and additional nonpharmacological measures, advised PCP follow-up in 1 week for reevaluation Final Clinical Impressions(s) / UC Diagnoses   Final diagnoses:  None   Discharge Instructions   None    ED Prescriptions   None    PDMP not reviewed this encounter.     [1]  Social History Tobacco Use   Smoking status: Former    Types: Cigars    Passive exposure: Past   Smokeless tobacco: Never  Vaping Use   Vaping status: Never Used  Substance Use Topics   Alcohol use: Yes    Alcohol/week: 18.0 standard drinks of alcohol    Types: 18 Cans of beer per week   Drug use: Never     Teresa Shelba SAUNDERS, NP 01/26/24 1622

## 2024-01-26 NOTE — ED Triage Notes (Signed)
 Patient reports excessive gas for 1 month. Denies pain. Patient states he was put on iron  1 month ago and feels like this was calling his gas. Patient states that he stopped taking iron  pills 3 weeks ago.

## 2024-01-26 NOTE — Discharge Instructions (Addendum)
 Today you are evaluated for abdominal gas and bloating, on exam there are increased bowel sounds throughout the abdominal cavity, you are currently not experiencing abdominal pain which would be more concerning for inflammation or infection of the organs  Symptoms are most likely due to recent abdominal bleeding causing irritation  You have been given a dose of Maalox which is a mixture medicine to help reduce stomach acid and gas in an attempt to relieve your pain  Would recommend that you begin using your Prilosec daily for at least 2 weeks to calm any stomach acid which can further help to reduce gas  Would recommend a bland diet with avoidance of spicy or greasy foods, high caffeine intake and alcohol as these are known irritants  Ensure that you are sitting up after meals for at least 30 minutes to help aid with digestion  If your symptoms continue to persist despite use of continue Prilosec please follow-up with your primary doctor as you will need further evaluation and possible imaging

## 2024-01-28 ENCOUNTER — Other Ambulatory Visit: Payer: Self-pay

## 2024-01-28 DIAGNOSIS — E119 Type 2 diabetes mellitus without complications: Secondary | ICD-10-CM

## 2024-02-02 ENCOUNTER — Other Ambulatory Visit: Payer: Self-pay

## 2024-02-02 MED ORDER — TRULICITY 1.5 MG/0.5ML ~~LOC~~ SOAJ
1.5000 mg | SUBCUTANEOUS | 0 refills | Status: AC
  Filled 2024-02-02 – 2024-03-02 (×2): qty 2, 28d supply, fill #0

## 2024-02-09 ENCOUNTER — Other Ambulatory Visit: Payer: Self-pay

## 2024-02-19 ENCOUNTER — Other Ambulatory Visit: Payer: Self-pay

## 2024-03-02 ENCOUNTER — Other Ambulatory Visit: Payer: Self-pay

## 2024-03-03 ENCOUNTER — Other Ambulatory Visit: Payer: Self-pay
